# Patient Record
Sex: Male | Born: 1935 | Race: White | Hispanic: No | Marital: Married | Smoking: Former smoker
Health system: Southern US, Community
[De-identification: ages and names within clinical notes are randomized; demographics above are authoritative.]

## PROBLEM LIST (undated history)

## (undated) DIAGNOSIS — H409 Unspecified glaucoma: Secondary | ICD-10-CM

## (undated) DIAGNOSIS — C4441 Basal cell carcinoma of skin of scalp and neck: Secondary | ICD-10-CM

## (undated) DIAGNOSIS — C44211 Basal cell carcinoma of skin of unspecified ear and external auricular canal: Secondary | ICD-10-CM

## (undated) DIAGNOSIS — M1990 Unspecified osteoarthritis, unspecified site: Secondary | ICD-10-CM

## (undated) HISTORY — PX: BASAL CELL CARCINOMA EXCISION: SHX1214

## (undated) HISTORY — PX: EYE SURGERY: SHX253

## (undated) HISTORY — PX: PARATHYROIDECTOMY: SHX19

## (undated) HISTORY — PX: TONSILLECTOMY: SUR1361

## (undated) HISTORY — PX: HAND SURGERY: SHX662

## (undated) HISTORY — PX: BACK SURGERY: SHX140

## (undated) NOTE — *Deleted (*Deleted)
Physical Medicine and Rehabilitation Consult  Reason for Consult: Stroke with functional deficits Referring Physician: Dr. Lequita Halt   HPI: Justin Rose is a 33 y.o. male with history of glaucoma, BCC, OA bilateral Knees who was admitted on 09/30/20 for L-TKR by Dr. Lequita Halt. Posts op he reported dizziness with left elbow numbness as well as visual deficits with floaters as well as transient speech difficutly. . CT head negative for acute changes. 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  CTA head/neck was negative for LVO or significant stenosis. BLE dopplers were negative for DVT. 2 D echo showed EF 60-65%. Dr. Roda Shutters felt that stroke was embolic due to unknown source and recommended DAPT --patient refused ILR therefore 30 day cardiac monitor recommended after discharge.  Foley placed due to urinary retention and Urine culture positive for proteus mirabilis. Therapy evaluations revealed mild cognitive impairment as well as LLE limitation due to recent TKR. CIR recommended due to functional deficits.    ROS    Past Medical History:  Diagnosis Date  . Arthritis   . Basal cell carcinoma (BCC) of head   . Basal cell carcinoma, ear    Left ear  . Glaucoma    Left eye    Past Surgical History:  Procedure Laterality Date  . BACK SURGERY     2021  . BASAL CELL CARCINOMA EXCISION    . EYE SURGERY     08/2020 left eye  . HAND SURGERY    . PARATHYROIDECTOMY    . TONSILLECTOMY    . TOTAL KNEE ARTHROPLASTY Right 09/30/2020   Procedure: TOTAL KNEE ARTHROPLASTY;  Surgeon: Ollen Gross, MD;  Location: [facility];  Service: Orthopedics;  Laterality: Right;    History reviewed. No pertinent family history.    Social History:  reports that he has quit smoking. He has a 4.00 pack-year smoking history. He has never used smokeless tobacco. He  reports that he does not drink alcohol and does not use drugs.    Allergies: No Known Allergies    Medications Prior to Admission  Medication Sig Dispense Refill  . brimonidine-timolol (COMBIGAN) 0.2-0.5 % ophthalmic solution Place 1 drop into the left eye daily.    . cetirizine (ZYRTEC) 10 MG tablet Take 10 mg by mouth daily.    . cholecalciferol (VITAMIN D3) 25 MCG (1000 UNIT) tablet Take 1,000 Units by mouth daily.    Marland Kitchen GLUCOSAMINE-CHONDROITIN PO Take 1 tablet by mouth daily.    Marland Kitchen ipratropium (ATROVENT) 0.06 % nasal spray Place 2 sprays into both nostrils daily.    Marland Kitchen MAGNESIUM PO Take 1 tablet by mouth daily.    . meloxicam (MOBIC) 15 MG tablet Take 15 mg by mouth daily.    . vitamin B-12 (CYANOCOBALAMIN) 1000 MCG tablet Take 1,000 mcg by mouth daily.      Home: Home Living Family/patient expects to be discharged to:: Private residence Living Arrangements: Spouse/significant other Available Help at Discharge: Family Type of Home: House Home Access: Stairs to enter Secretary/administrator of Steps: 3 Entrance Stairs-Rails: Left Home Layout: One level Bathroom Shower/Tub: Health visitor: Handicapped height Bathroom Accessibility: Yes Home Equipment: Environmental consultant - 2 wheels, Crutches, Cane - single point, Wheelchair - manual Additional Comments: wife named Development worker, community  Functional History: Prior Function Level of Independence: Independent Comments: reports enjoys playing golf and does all the yard work Psychologist, clinical  Status:  Mobility: Bed Mobility Overal bed mobility: Needs Assistance Bed Mobility: Supine to Sit Supine to sit: Min assist Sit to supine: Max assist General bed mobility comments: minA to scoot forward, requires hand hold to pull forward Transfers Overall transfer level: Needs assistance Equipment used: Rolling walker (2 wheeled) Transfers: Sit to/from Stand, [employer] Transfers Sit to Stand: Mod assist, Max assist Stand pivot transfers: Mod assist  General transfer comment: modA with initial transfer, maxA from bedside commode, pt continues to have difficulty extending L knee and bilateral hips, often with posterior lean requiring physical assistance to correct Ambulation/Gait Ambulation/Gait assistance: Min assist Gait Distance (Feet): 5 Feet Assistive device: Rolling walker (2 wheeled) Gait Pattern/deviations: Step-to pattern General Gait Details: pt with short step-to gait, reduced foot clearance bilaterally Gait velocity: reduced Gait velocity interpretation: <1.31 ft/sec, indicative of household ambulator    ADL: ADL Overall ADL's : Needs assistance/impaired Eating/Feeding: Modified independent Grooming: Wash/dry hands, Wash/dry face, Oral care, Minimal assistance, Cueing for safety, Cueing for sequencing Lower Body Dressing: Total assistance Toilet Transfer: +2 for physical assistance, Maximal assistance Functional mobility during ADLs: +2 for physical assistance, Maximal assistance, Cueing for safety, Cueing for sequencing General ADL Comments: pt requires cues throughout session for safety and hand placement  Cognition: Cognition Overall Cognitive Status: Impaired/Different from baseline Arousal/Alertness: Awake/alert Orientation Level: Oriented X4 Attention: Selective Selective Attention: Impaired Selective Attention Impairment: Verbal basic Memory: Impaired Memory Impairment: Storage deficit Problem Solving: Impaired Problem Solving Impairment: Verbal complex Cognition Arousal/Alertness: Awake/alert Behavior During Therapy: WFL for tasks assessed/performed Overall Cognitive Status: Impaired/Different from baseline Area of Impairment: Problem solving Memory: Decreased short-term memory Following Commands: Follows one step commands consistently Safety/Judgement: Decreased awareness of safety, Decreased awareness of deficits Awareness: Emergent Problem Solving: Difficulty sequencing General Comments: WFL  cognition for tasks performed this date, not formally assessed  Blood pressure (!) 145/96, pulse 92, temperature 98 F (36.7 C), temperature source Oral, resp. rate 17, height 5\' 11"  (1.803 m), weight 85.5 kg, SpO2 100 %. Physical Exam  Results for orders placed or performed during the hospital encounter of 09/30/20 (from the past 24 hour(s))  CK     Status: Abnormal   Collection Time: 10/07/20  2:30 AM  Result Value Ref Range   Total CK 927 (H) 49.0 - 397.0 U/L  Lactate dehydrogenase     Status: Abnormal   Collection Time: 10/07/20  2:30 AM  Result Value Ref Range   LDH 234 (H) 98 - 192 U/L   No results found.  ***  Jacquelynn Cree, PA-C 10/07/2020

---

## 2020-09-17 NOTE — Patient Instructions (Addendum)
DUE TO COVID-19 ONLY ONE VISITOR IS ALLOWED TO COME WITH YOU AND STAY IN THE WAITING ROOM ONLY DURING PRE OP AND PROCEDURE.   IF YOU WILL BE ADMITTED INTO THE HOSPITAL YOU ARE ALLOWED ONE SUPPORT PERSON DURING VISITATION HOURS ONLY (10AM -8PM)   . The support person may change daily. . The support person must pass our screening, gel in and out, and wear a mask at all times, including in the patient's room. . Patients must also wear a mask when staff or their support person are in the room.   COVID SWAB TESTING MUST BE COMPLETED ON:   Thursday, 09-26-20 @ 8:30 AM   [address] [place], [place] [address]  (Must self quarantine after testing. Follow instructions on handout.)    Your procedure is scheduled on:  Monday, 09-30-20   Report to [facility] Main  Entrance   Report to Short Stay at 5:30 AM   [facility])   Call this number if you have problems the morning of surgery [phone]   Do not eat food :After Midnight.   May have liquids until 4:15 AM  day of surgery  CLEAR LIQUID DIET  Foods Allowed                                                                     Foods Excluded  Water, Black Coffee and tea, regular and decaf            liquids that you cannot  Plain Jell-O in any flavor  (No red)                                  see through such as: Fruit ices (not with fruit pulp)                                      milk, soups, orange juice              Iced Popsicles (No red)                                      All solid food                                   Apple juices Sports drinks like Gatorade (No red) Lightly seasoned clear broth or consume(fat free) Sugar, honey syrup     Complete one Ensure drink the morning of surgery at 4:15 AM  the day of surgery.  Oral Hygiene is also important to reduce your risk of infection.                                    Remember - BRUSH YOUR TEETH THE MORNING OF SURGERY WITH YOUR REGULAR TOOTHPASTE   Do NOT smoke  after Midnight   Take these medicines the morning of surgery with  A SIP OF WATER:  Cetirizine                                You may not have any metal on your body including jewelry, and body piercings             Do not wear lotions, powders, perfumes/cologne, or deodorant             Men may shave face and neck.   Do not bring valuables to the hospital. [place].   Contacts, dentures or bridgework may not be worn into surgery.   Bring small overnight bag day of surgery.                 Please read over the following fact sheets you were given: IF YOU HAVE QUESTIONS ABOUT YOUR PRE OP INSTRUCTIONS PLEASE CALL [phone]   [place] - Preparing for Surgery Before surgery, you can play an important role.  Because skin is not sterile, your skin needs to be as free of germs as possible.  You can reduce the number of germs on your skin by washing with CHG (chlorahexidine gluconate) soap before surgery.  CHG is an antiseptic cleaner which kills germs and bonds with the skin to continue killing germs even after washing. Please DO NOT use if you have an allergy to CHG or antibacterial soaps.  If your skin becomes reddened/irritated stop using the CHG and inform your nurse when you arrive at Short Stay. Do not shave (including legs and underarms) for at least 48 hours prior to the first CHG shower.  You may shave your face/neck.  Please follow these instructions carefully:  1.  Shower with CHG Soap the night before surgery and the  morning of surgery.  2.  If you choose to wash your hair, wash your hair first as usual with your normal  shampoo.  3.  After you shampoo, rinse your hair and body thoroughly to remove the shampoo.                             4.  Use CHG as you would any other liquid soap.  You can apply chg directly to the skin and wash.  Gently with a scrungie or clean washcloth.  5.  Apply the CHG Soap to your body ONLY FROM THE NECK DOWN.    Do   not use on face/ open                           Wound or open sores. Avoid contact with eyes, ears mouth and   genitals (private parts).                       Wash face,  Genitals (private parts) with your normal soap.             6.  Wash thoroughly, paying special attention to the area where your    surgery  will be performed.  7.  Thoroughly rinse your body with warm water from the neck down.  8.  DO NOT shower/wash with your normal soap after using and rinsing off the CHG Soap.                9.  Pat yourself dry  with a clean towel.            10.  Wear clean pajamas.            11.  Place clean sheets on your bed the night of your first shower and do not  sleep with pets. Day of Surgery : Do not apply any lotions/deodorants the morning of surgery.  Please wear clean clothes to the hospital/surgery center.  FAILURE TO FOLLOW THESE INSTRUCTIONS MAY RESULT IN THE CANCELLATION OF YOUR SURGERY  PATIENT SIGNATURE_________________________________  NURSE SIGNATURE__________________________________  ________________________________________________________________________   Justin Rose  An incentive spirometer is a tool that can help keep your lungs clear and active. This tool measures how well you are filling your lungs with each breath. Taking long deep breaths may help reverse or decrease the chance of developing breathing (pulmonary) problems (especially infection) following:  A long period of time when you are unable to move or be active. BEFORE THE PROCEDURE   If the spirometer includes an indicator to show your best effort, your nurse or respiratory therapist will set it to a desired goal.  If possible, sit up straight or lean slightly forward. Try not to slouch.  Hold the incentive spirometer in an upright position. INSTRUCTIONS FOR USE  1. Sit on the edge of your bed if possible, or sit up as far as you can in bed or on a chair. 2. Hold the incentive spirometer  in an upright position. 3. Breathe out normally. 4. Place the mouthpiece in your mouth and seal your lips tightly around it. 5. Breathe in slowly and as deeply as possible, raising the piston or the ball toward the top of the column. 6. Hold your breath for 3-5 seconds or for as long as possible. Allow the piston or ball to fall to the bottom of the column. 7. Remove the mouthpiece from your mouth and breathe out normally. 8. Rest for a few seconds and repeat Steps 1 through 7 at least 10 times every 1-2 hours when you are awake. Take your time and take a few normal breaths between deep breaths. 9. The spirometer may include an indicator to show your best effort. Use the indicator as a goal to work toward during each repetition. 10. After each set of 10 deep breaths, practice coughing to be sure your lungs are clear. If you have an incision (the cut made at the time of surgery), support your incision when coughing by placing a pillow or rolled up towels firmly against it. Once you are able to get out of bed, walk around indoors and cough well. You may stop using the incentive spirometer when instructed by your caregiver.  RISKS AND COMPLICATIONS  Take your time so you do not get dizzy or light-headed.  If you are in pain, you may need to take or ask for pain medication before doing incentive spirometry. It is harder to take a deep breath if you are having pain. AFTER USE  Rest and breathe slowly and easily.  It can be helpful to keep track of a log of your progress. Your caregiver can provide you with a simple table to help with this. If you are using the spirometer at home, follow these instructions: [place] IF:   You are having difficultly using the spirometer.  You have trouble using the spirometer as often as instructed.  Your pain medication is not giving enough relief while using the spirometer.  You develop fever of 100.5 F (  38.1 C) or higher. SEEK IMMEDIATE MEDICAL  CARE IF:   You cough up bloody sputum that had not been present before.  You develop fever of 102 F (38.9 C) or greater.  You develop worsening pain at or near the incision site. MAKE SURE YOU:   Understand these instructions.  Will watch your condition.  Will get help right away if you are not doing well or get worse. Document Released: 03/15/2007 Document Revised: 01/25/2012 Document Reviewed: 05/16/2007 Exit[employer] Patient Information 2014 [employer], [place].   ________________________________________________________________________  WHAT IS A BLOOD TRANSFUSION? Blood Transfusion Information  A transfusion is the replacement of blood or some of its parts. Blood is made up of multiple cells which provide different functions.  Red blood cells carry oxygen and are used for blood loss replacement.  White blood cells fight against infection.  Platelets control bleeding.  Plasma helps clot blood.  Other blood products are available for specialized needs, such as hemophilia or other clotting disorders. BEFORE THE TRANSFUSION  Who gives blood for transfusions?   Healthy volunteers who are fully evaluated to make sure their blood is safe. This is blood bank blood. Transfusion therapy is the safest it has ever been in the practice of medicine. Before blood is taken from a donor, a complete history is taken to make sure that person has no history of diseases nor engages in risky social behavior (examples are intravenous drug use or sexual activity with multiple partners). The donor's travel history is screened to minimize risk of transmitting infections, such as malaria. The donated blood is tested for signs of infectious diseases, such as HIV and hepatitis. The blood is then tested to be sure it is compatible with you in order to minimize the chance of a transfusion reaction. If you or a relative donates blood, this is often done in anticipation of surgery and is not appropriate for  emergency situations. It takes many days to process the donated blood. RISKS AND COMPLICATIONS Although transfusion therapy is very safe and saves many lives, the main dangers of transfusion include:   Getting an infectious disease.  Developing a transfusion reaction. This is an allergic reaction to something in the blood you were given. Every precaution is taken to prevent this. The decision to have a blood transfusion has been considered carefully by your caregiver before blood is given. Blood is not given unless the benefits outweigh the risks. AFTER THE TRANSFUSION  Right after receiving a blood transfusion, you will usually feel much better and more energetic. This is especially true if your red blood cells have gotten low (anemic). The transfusion raises the level of the red blood cells which carry oxygen, and this usually causes an energy increase.  The nurse administering the transfusion will monitor you carefully for complications. HOME CARE INSTRUCTIONS  No special instructions are needed after a transfusion. You may find your energy is better. Speak with your caregiver about any limitations on activity for underlying diseases you may have. SEEK MEDICAL CARE IF:   Your condition is not improving after your transfusion.  You develop redness or irritation at the intravenous (IV) site. SEEK IMMEDIATE MEDICAL CARE IF:  Any of the following symptoms occur over the next 12 hours:  Shaking chills.  You have a temperature by mouth above 102 F (38.9 C), not controlled by medicine.  Chest, back, or muscle pain.  People around you feel you are not acting correctly or are confused.  Shortness of breath or difficulty breathing.  Dizziness and fainting.  You get a rash or develop hives.  You have a decrease in urine output.  Your urine turns a dark color or changes to pink, red, or brown. Any of the following symptoms occur over the next 10 days:  You have a temperature by  mouth above 102 F (38.9 C), not controlled by medicine.  Shortness of breath.  Weakness after normal activity.  The white part of the eye turns yellow (jaundice).  You have a decrease in the amount of urine or are urinating less often.  Your urine turns a dark color or changes to pink, red, or brown. Document Released: 10/30/2000 Document Revised: 01/25/2012 Document Reviewed: 06/18/2008 [facility] Patient Information 2014 [place], [place].  _______________________________________________________________________

## 2020-09-17 NOTE — Progress Notes (Addendum)
COVID Vaccine Completed:  x2 Date COVID Vaccine completed:  11-16-19 &  12-14-19 09-19-20 Booster COVID vaccine manufacturer: [employer]   PCP - Charlcie Cradle, MD Cardiologist -   Chest x-ray -  EKG -  Stress Test -  ECHO -  Cardiac Cath -  Pacemaker/ICD device last checked:  Sleep Study -  CPAP -   Fasting Blood Sugar -  Checks Blood Sugar _____ times a day  Blood Thinner Instructions: Aspirin Instructions: Last Dose:  Anesthesia review:   Patient denies shortness of breath, fever, cough and chest pain at PAT appointment   Patient verbalized understanding of instructions that were given to them at the PAT appointment. Patient was also instructed that they will need to review over the PAT instructions again at home before surgery.

## 2020-09-20 ENCOUNTER — Encounter (HOSPITAL_COMMUNITY): Payer: Self-pay

## 2020-09-20 ENCOUNTER — Other Ambulatory Visit: Payer: Self-pay

## 2020-09-20 ENCOUNTER — Encounter (HOSPITAL_COMMUNITY)
Admission: RE | Admit: 2020-09-20 | Discharge: 2020-09-20 | Disposition: A | Discharge status: Home / Self Care | Payer: Medicare HMO | Source: Ambulatory Visit | Attending: Orthopedic Surgery | Admitting: Orthopedic Surgery

## 2020-09-20 DIAGNOSIS — Z01818 Encounter for other preprocedural examination: Secondary | ICD-10-CM | POA: Diagnosis not present

## 2020-09-20 HISTORY — DX: Unspecified osteoarthritis, unspecified site: M19.90

## 2020-09-20 HISTORY — DX: Basal cell carcinoma of skin of unspecified ear and external auricular canal: C44.211

## 2020-09-20 HISTORY — DX: Unspecified glaucoma: H40.9

## 2020-09-20 HISTORY — DX: Basal cell carcinoma of skin of scalp and neck: C44.41

## 2020-09-20 LAB — COMPREHENSIVE METABOLIC PANEL
ALT: 18 U/L (ref 0–44)
AST: 18 U/L (ref 15–41)
Albumin: 4 g/dL (ref 3.5–5.0)
Alkaline Phosphatase: 72 U/L (ref 38–126)
Anion gap: 9 (ref 5–15)
BUN: 29 mg/dL — ABNORMAL HIGH (ref 8–23)
CO2: 25 mmol/L (ref 22–32)
Calcium: 9.3 mg/dL (ref 8.9–10.3)
Chloride: 110 mmol/L (ref 98–111)
Creatinine, Ser: 0.78 mg/dL (ref 0.61–1.24)
GFR, Estimated: >60 mL/min (ref >60)
Glucose, Bld: 90 mg/dL (ref 70–99)
Potassium: 4.8 mmol/L (ref 3.5–5.1)
Sodium: 144 mmol/L (ref 135–145)
Total Bilirubin: 0.9 mg/dL (ref 0.3–1.2)
Total Protein: 6.7 g/dL (ref 6.5–8.1)

## 2020-09-20 LAB — CBC
HCT: 43.1 % (ref 39.0–52.0)
Hemoglobin: 13.9 g/dL (ref 13.0–17.0)
MCH: 31.2 pg (ref 26.0–34.0)
MCHC: 32.3 g/dL (ref 30.0–36.0)
MCV: 96.9 fL (ref 80.0–100.0)
Platelets: 171 10*3/uL (ref 150–400)
RBC: 4.45 MIL/uL (ref 4.22–5.81)
RDW: 13 % (ref 11.5–15.5)
WBC: 5.7 10*3/uL (ref 4.0–10.5)
nRBC: 0 % (ref 0.0–0.2)

## 2020-09-20 LAB — PROTIME-INR
INR: 1.1 (ref 0.8–1.2)
Prothrombin Time: 13.4 seconds (ref 11.4–15.2)

## 2020-09-20 LAB — SURGICAL PCR SCREEN
MRSA, PCR: NEGATIVE
Staphylococcus aureus: POSITIVE — AB

## 2020-09-20 LAB — APTT: aPTT: 38 seconds — ABNORMAL HIGH (ref 24–36)

## 2020-09-20 LAB — TYPE AND SCREEN
ABO/RH(D): O POS
Antibody Screen: NEGATIVE

## 2020-09-25 NOTE — H&P (Signed)
TOTAL KNEE ADMISSION H&P  Patient is being admitted for right total knee arthroplasty.  Subjective:  Chief Complaint: Right knee pain.  HPI: Justin Rose, 86 y.o. male has a history of pain and functional disability in the right knee due to arthritis and has failed non-surgical conservative treatments for greater than 12 weeks to include corticosteriod injections and activity modification. Onset of symptoms was gradual, starting several years ago with gradually worsening course since that time. The patient noted no past surgery on the right knee.  Patient currently rates pain in the right knee at 6 out of 10 with activity. Patient has worsening of pain with activity and weight bearing, pain that interferes with activities of daily living and crepitus. Patient has evidence of bone-on-bone arthritis in the medial and patellofemoral compartments of the right knee with as large marginal osteophytes as well as anterior and posterior osteophytes by imaging studies. There is no active infection.  There are no problems to display for this patient.   Past Medical History:  Diagnosis Date  . Arthritis   . Basal cell carcinoma (BCC) of head   . Basal cell carcinoma, ear    Left ear  . Glaucoma    Left eye    Past Surgical History:  Procedure Laterality Date  . BACK SURGERY     2021  . BASAL CELL CARCINOMA EXCISION    . EYE SURGERY     08/2020 left eye  . HAND SURGERY    . PARATHYROIDECTOMY    . TONSILLECTOMY      Prior to Admission medications   Medication Sig Start Date End Date Taking? Authorizing Provider  brimonidine-timolol (COMBIGAN) 0.2-0.5 % ophthalmic solution Place 1 drop into the left eye daily.   Yes [provider]  cetirizine (ZYRTEC) 10 MG tablet Take 10 mg by mouth daily.   Yes [provider]  cholecalciferol (VITAMIN D3) 25 MCG (1000 UNIT) tablet Take 1,000 Units by mouth daily.   Yes [provider]  GLUCOSAMINE-CHONDROITIN PO Take 1 tablet by  mouth daily.   Yes [provider]  ipratropium (ATROVENT) 0.06 % nasal spray Place 2 sprays into both nostrils daily. 08/20/20  Yes [provider]  MAGNESIUM PO Take 1 tablet by mouth daily.   Yes [provider]  meloxicam (MOBIC) 15 MG tablet Take 15 mg by mouth daily. 07/04/20  Yes [provider]  vitamin B-12 (CYANOCOBALAMIN) 1000 MCG tablet Take 1,000 mcg by mouth daily.   Yes [provider]    No Known Allergies  Social History   Socioeconomic History  . Marital status: Married    Spouse name: Not on file  . Number of children: Not on file  . Years of education: Not on file  . Highest education level: Not on file  Occupational History  . Not on file  Tobacco Use  . Smoking status: Former Smoker    Packs/day: 0.25    Years: 16.00    Pack years: 4.00  . Smokeless tobacco: Never Used  . Tobacco comment: Quit 1970s  Vaping Use  . Vaping Use: Never used  Substance and Sexual Activity  . Alcohol use: Never  . Drug use: Never  . Sexual activity: Not on file  Other Topics Concern  . Not on file  Social History Narrative  . Not on file   Social Determinants of Health   Financial Resource Strain:   . Difficulty of Paying Living Expenses: Not on file  Food Insecurity:   .  Worried About Charity fundraiser in the Last Year: Not on file  . Ran Out of Food in the Last Year: Not on file  Transportation Needs:   . Lack of Transportation (Medical): Not on file  . Lack of Transportation (Non-Medical): Not on file  Physical Activity:   . Days of Exercise per Week: Not on file  . Minutes of Exercise per Session: Not on file  Stress:   . Feeling of Stress : Not on file  Social Connections:   . Frequency of Communication with Friends and Family: Not on file  . Frequency of Social Gatherings with Friends and Family: Not on file  . Attends Religious Services: Not on file  . Active Member of Clubs or Organizations: Not on file  .  Attends Archivist Meetings: Not on file  . Marital Status: Not on file  Intimate Partner Violence:   . Fear of Current or Ex-Partner: Not on file  . Emotionally Abused: Not on file  . Physically Abused: Not on file  . Sexually Abused: Not on file      Tobacco Use: Medium Risk  . Smoking Tobacco Use: Former Smoker  . Smokeless Tobacco Use: Never Used   Social History   Substance and Sexual Activity  Alcohol Use Never    No family history on file.  Review of Systems  Constitutional: Negative for chills and fever.  HENT: Negative for congestion, sore throat and tinnitus.   Eyes: Negative for double vision, photophobia and pain.  Respiratory: Negative for cough, shortness of breath and wheezing.   Cardiovascular: Negative for chest pain, palpitations and orthopnea.  Gastrointestinal: Negative for heartburn, nausea and vomiting.  Genitourinary: Negative for dysuria, frequency and urgency.  Musculoskeletal: Positive for joint pain.  Neurological: Negative for dizziness, weakness and headaches.    Objective:  Physical Exam: Well nourished and well developed.  General: Alert and oriented x3, cooperative and pleasant, no acute distress.  Head: normocephalic, atraumatic, neck supple.  Eyes: EOMI.  Respiratory: breath sounds clear in all fields, no wheezing, rales, or rhonchi. Cardiovascular: Regular rate and rhythm, no murmurs, gallops or rubs.  Abdomen: non-tender to palpation and soft, normoactive bowel sounds. Musculoskeletal:  Right Knee Exam:  No effusion present. No swelling present.  The range of motion is: 5 to 125 degrees.  Moderate crepitus on range of motion of the knee.  Very slight medial joint line tenderness.  No lateral joint line tenderness.  The knee is stable.   Calves soft and nontender. Motor function intact in LE. Strength 5/5 LE bilaterally. Neuro: Distal pulses 2+. Sensation to light touch intact in LE.  Imaging Review Plain  radiographs demonstrate severe degenerative joint disease of the right knee. The overall alignment is neutral. The bone quality appears to be adequate for age and reported activity level.  Assessment/Plan:  End stage arthritis, right knee   The patient history, physical examination, clinical judgment of the provider and imaging studies are consistent with end stage degenerative joint disease of the right knee and total knee arthroplasty is deemed medically necessary. The treatment options including medical management, injection therapy arthroscopy and arthroplasty were discussed at length. The risks and benefits of total knee arthroplasty were presented and reviewed. The risks due to aseptic loosening, infection, stiffness, patella tracking problems, thromboembolic complications and other imponderables were discussed. The patient acknowledged the explanation, agreed to proceed with the plan and consent was signed. Patient is being admitted for inpatient treatment for surgery, pain control,  PT, OT, prophylactic antibiotics, VTE prophylaxis, progressive ambulation and ADLs and discharge planning. The patient is planning to be discharged home.  Therapy Plans: Outpatient therapy at [facility]) Disposition: Home with wife Planned DVT Prophylaxis: Aspirin 325 mg BID DME Needed: None PCP: Charlcie Cradle, MD  TXA: IV Allergies: NKDA Anesthesia Concerns: None BMI: 25.7 Last HgbA1c: Not diabetic  Pharmacy: [facility] ([place])  Other:  - Tolerates oxycodone - Will call Dr. Vista Lawman' office today regarding clearance  - Patient was instructed on what medications to stop prior to surgery. - Follow-up visit in 2 weeks with Dr. Wynelle Link - Begin physical therapy following surgery - Pre-operative lab work as pre-surgical testing - Prescriptions will be provided in hospital at time of discharge  Theresa Duty, PA-C Orthopedic Surgery [facility] [place]

## 2020-09-26 ENCOUNTER — Other Ambulatory Visit (HOSPITAL_COMMUNITY)
Admission: RE | Admit: 2020-09-26 | Discharge: 2020-09-26 | Disposition: A | Discharge status: Home / Self Care | Payer: Medicare HMO | Source: Ambulatory Visit | Attending: Orthopedic Surgery | Admitting: Orthopedic Surgery

## 2020-09-26 DIAGNOSIS — Z01812 Encounter for preprocedural laboratory examination: Secondary | ICD-10-CM | POA: Diagnosis present

## 2020-09-26 DIAGNOSIS — Z20822 Contact with and (suspected) exposure to covid-19: Secondary | ICD-10-CM | POA: Insufficient documentation

## 2020-09-26 LAB — SARS CORONAVIRUS 2 (TAT 6-24 HRS): SARS Coronavirus 2: NEGATIVE

## 2020-09-29 MED ORDER — BUPIVACAINE LIPOSOME 1.3 % IJ SUSP
20.0000 mL | Freq: Once | INTRAMUSCULAR | Status: DC
  Filled 2020-09-29: qty 20

## 2020-09-30 ENCOUNTER — Other Ambulatory Visit: Payer: Self-pay

## 2020-09-30 ENCOUNTER — Ambulatory Visit (HOSPITAL_COMMUNITY): Payer: Medicare HMO | Admitting: Anesthesiology

## 2020-09-30 ENCOUNTER — Encounter (HOSPITAL_COMMUNITY): Payer: Self-pay | Admitting: Orthopedic Surgery

## 2020-09-30 ENCOUNTER — Encounter (HOSPITAL_COMMUNITY)
Admission: RE | Disposition: A | Discharge status: Rehabilitation Facility | Payer: Self-pay | Source: Ambulatory Visit | Attending: Orthopedic Surgery

## 2020-09-30 ENCOUNTER — Inpatient Hospital Stay (HOSPITAL_COMMUNITY)
Admission: RE | Admit: 2020-09-30 | Discharge: 2020-10-09 | DRG: 469 | Disposition: A | Discharge status: Rehabilitation Facility | Payer: Medicare HMO | Source: Ambulatory Visit | Attending: Orthopedic Surgery | Admitting: Orthopedic Surgery

## 2020-09-30 DIAGNOSIS — M179 Osteoarthritis of knee, unspecified: Secondary | ICD-10-CM | POA: Diagnosis present

## 2020-09-30 DIAGNOSIS — Z6826 Body mass index (BMI) 26.0-26.9, adult: Secondary | ICD-10-CM

## 2020-09-30 DIAGNOSIS — R29700 NIHSS score 0: Secondary | ICD-10-CM | POA: Diagnosis not present

## 2020-09-30 DIAGNOSIS — H409 Unspecified glaucoma: Secondary | ICD-10-CM | POA: Diagnosis present

## 2020-09-30 DIAGNOSIS — B964 Proteus (mirabilis) (morganii) as the cause of diseases classified elsewhere: Secondary | ICD-10-CM | POA: Diagnosis not present

## 2020-09-30 DIAGNOSIS — R42 Dizziness and giddiness: Secondary | ICD-10-CM

## 2020-09-30 DIAGNOSIS — D62 Acute posthemorrhagic anemia: Secondary | ICD-10-CM

## 2020-09-30 DIAGNOSIS — N400 Enlarged prostate without lower urinary tract symptoms: Secondary | ICD-10-CM | POA: Diagnosis present

## 2020-09-30 DIAGNOSIS — E669 Obesity, unspecified: Secondary | ICD-10-CM | POA: Diagnosis present

## 2020-09-30 DIAGNOSIS — M170 Bilateral primary osteoarthritis of knee: Principal | ICD-10-CM | POA: Diagnosis present

## 2020-09-30 DIAGNOSIS — M1711 Unilateral primary osteoarthritis, right knee: Secondary | ICD-10-CM | POA: Diagnosis present

## 2020-09-30 DIAGNOSIS — M1710 Unilateral primary osteoarthritis, unspecified knee: Secondary | ICD-10-CM | POA: Diagnosis present

## 2020-09-30 DIAGNOSIS — M6282 Rhabdomyolysis: Secondary | ICD-10-CM | POA: Diagnosis not present

## 2020-09-30 DIAGNOSIS — Z87891 Personal history of nicotine dependence: Secondary | ICD-10-CM

## 2020-09-30 DIAGNOSIS — Z791 Long term (current) use of non-steroidal anti-inflammatories (NSAID): Secondary | ICD-10-CM

## 2020-09-30 DIAGNOSIS — H538 Other visual disturbances: Secondary | ICD-10-CM | POA: Diagnosis present

## 2020-09-30 DIAGNOSIS — I6389 Other cerebral infarction: Principal | ICD-10-CM | POA: Diagnosis present

## 2020-09-30 DIAGNOSIS — T466X5A Adverse effect of antihyperlipidemic and antiarteriosclerotic drugs, initial encounter: Secondary | ICD-10-CM | POA: Diagnosis not present

## 2020-09-30 DIAGNOSIS — Z79899 Other long term (current) drug therapy: Secondary | ICD-10-CM

## 2020-09-30 DIAGNOSIS — R279 Unspecified lack of coordination: Secondary | ICD-10-CM

## 2020-09-30 DIAGNOSIS — Z85828 Personal history of other malignant neoplasm of skin: Secondary | ICD-10-CM

## 2020-09-30 DIAGNOSIS — E785 Hyperlipidemia, unspecified: Secondary | ICD-10-CM | POA: Diagnosis present

## 2020-09-30 DIAGNOSIS — R339 Retention of urine, unspecified: Secondary | ICD-10-CM

## 2020-09-30 DIAGNOSIS — K5903 Drug induced constipation: Secondary | ICD-10-CM

## 2020-09-30 DIAGNOSIS — M609 Myositis, unspecified: Secondary | ICD-10-CM | POA: Diagnosis not present

## 2020-09-30 DIAGNOSIS — N390 Urinary tract infection, site not specified: Secondary | ICD-10-CM

## 2020-09-30 DIAGNOSIS — I6340 Cerebral infarction due to embolism of unspecified cerebral artery: Secondary | ICD-10-CM | POA: Diagnosis not present

## 2020-09-30 DIAGNOSIS — N401 Enlarged prostate with lower urinary tract symptoms: Secondary | ICD-10-CM | POA: Diagnosis present

## 2020-09-30 HISTORY — PX: TOTAL KNEE ARTHROPLASTY: SHX125

## 2020-09-30 SURGERY — ARTHROPLASTY, KNEE, TOTAL
Anesthesia: Spinal | Site: Knee | Laterality: Right

## 2020-09-30 MED ORDER — ACETAMINOPHEN 325 MG PO TABS: 325.0000 mg | ORAL_TABLET | Freq: Once | ORAL | Status: DC | PRN

## 2020-09-30 MED ORDER — LORATADINE 10 MG PO TABS
10.0000 mg | ORAL_TABLET | Freq: Every day | ORAL | Status: DC
  Administered 2020-10-01 – 2020-10-09 (×9): 10 mg via ORAL
  Filled 2020-09-30 (×9): qty 1

## 2020-09-30 MED ORDER — CEFAZOLIN SODIUM-DEXTROSE 2-4 GM/100ML-% IV SOLN
2.0000 g | INTRAVENOUS | Status: AC
  Administered 2020-09-30: 2 g via INTRAVENOUS
  Filled 2020-09-30: qty 100

## 2020-09-30 MED ORDER — DOCUSATE SODIUM 100 MG PO CAPS
100.0000 mg | ORAL_CAPSULE | Freq: Two times a day (BID) | ORAL | Status: DC
  Administered 2020-09-30 – 2020-10-04 (×8): 100 mg via ORAL
  Filled 2020-09-30 (×8): qty 1

## 2020-09-30 MED ORDER — CHLORHEXIDINE GLUCONATE 0.12 % MT SOLN
15.0000 mL | Freq: Once | OROMUCOSAL | Status: AC
  Administered 2020-09-30: 15 mL via OROMUCOSAL

## 2020-09-30 MED ORDER — PROPOFOL 1000 MG/100ML IV EMUL
INTRAVENOUS | Status: AC
  Filled 2020-09-30: qty 100

## 2020-09-30 MED ORDER — BRIMONIDINE TARTRATE 0.2 % OP SOLN
1.0000 [drp] | Freq: Every day | OPHTHALMIC | Status: DC
  Administered 2020-09-30 – 2020-10-06 (×7): 1 [drp] via OPHTHALMIC
  Filled 2020-09-30: qty 5

## 2020-09-30 MED ORDER — DEXAMETHASONE SODIUM PHOSPHATE 10 MG/ML IJ SOLN
8.0000 mg | Freq: Once | INTRAMUSCULAR | Status: AC
  Administered 2020-09-30: 10 mg via INTRAVENOUS

## 2020-09-30 MED ORDER — DEXAMETHASONE SODIUM PHOSPHATE 10 MG/ML IJ SOLN
INTRAMUSCULAR | Status: AC
  Filled 2020-09-30: qty 1

## 2020-09-30 MED ORDER — ACETAMINOPHEN 10 MG/ML IV SOLN: 1000.0000 mg | Freq: Once | INTRAVENOUS | Status: DC | PRN

## 2020-09-30 MED ORDER — PROPOFOL 10 MG/ML IV BOLUS
INTRAVENOUS | Status: DC | PRN
  Administered 2020-09-30: 30 mg via INTRAVENOUS

## 2020-09-30 MED ORDER — METHOCARBAMOL 500 MG PO TABS
500.0000 mg | ORAL_TABLET | Freq: Four times a day (QID) | ORAL | Status: DC | PRN
  Administered 2020-09-30 – 2020-10-09 (×17): 500 mg via ORAL
  Filled 2020-09-30 (×18): qty 1

## 2020-09-30 MED ORDER — FLEET ENEMA 7-19 GM/118ML RE ENEM: 1.0000 | ENEMA | Freq: Once | RECTAL | Status: DC | PRN

## 2020-09-30 MED ORDER — ONDANSETRON HCL 4 MG/2ML IJ SOLN
INTRAMUSCULAR | Status: DC | PRN
  Administered 2020-09-30: 4 mg via INTRAVENOUS

## 2020-09-30 MED ORDER — ACETAMINOPHEN 325 MG PO TABS: 325.0000 mg | ORAL_TABLET | Freq: Four times a day (QID) | ORAL | Status: DC | PRN

## 2020-09-30 MED ORDER — MENTHOL 3 MG MT LOZG: 1.0000 | LOZENGE | OROMUCOSAL | Status: DC | PRN

## 2020-09-30 MED ORDER — ONDANSETRON HCL 4 MG/2ML IJ SOLN
4.0000 mg | Freq: Four times a day (QID) | INTRAMUSCULAR | Status: DC | PRN
  Administered 2020-10-01: 4 mg via INTRAVENOUS
  Filled 2020-09-30: qty 2

## 2020-09-30 MED ORDER — LACTATED RINGERS IV SOLN: INTRAVENOUS | Status: DC

## 2020-09-30 MED ORDER — BISACODYL 10 MG RE SUPP: 10.0000 mg | Freq: Every day | RECTAL | Status: DC | PRN

## 2020-09-30 MED ORDER — ACETAMINOPHEN 160 MG/5ML PO SOLN: 325.0000 mg | Freq: Once | ORAL | Status: DC | PRN

## 2020-09-30 MED ORDER — METOCLOPRAMIDE HCL 5 MG/ML IJ SOLN: 5.0000 mg | Freq: Three times a day (TID) | INTRAMUSCULAR | Status: DC | PRN

## 2020-09-30 MED ORDER — BUPIVACAINE LIPOSOME 1.3 % IJ SUSP
INTRAMUSCULAR | Status: DC | PRN
  Administered 2020-09-30: 20 mL

## 2020-09-30 MED ORDER — ACETAMINOPHEN 10 MG/ML IV SOLN
1000.0000 mg | Freq: Four times a day (QID) | INTRAVENOUS | Status: DC
  Administered 2020-09-30: 1000 mg via INTRAVENOUS
  Filled 2020-09-30: qty 100

## 2020-09-30 MED ORDER — AMISULPRIDE (ANTIEMETIC) 5 MG/2ML IV SOLN: 10.0000 mg | Freq: Once | INTRAVENOUS | Status: DC | PRN

## 2020-09-30 MED ORDER — ONDANSETRON HCL 4 MG/2ML IJ SOLN
INTRAMUSCULAR | Status: AC
  Filled 2020-09-30: qty 2

## 2020-09-30 MED ORDER — STERILE WATER FOR IRRIGATION IR SOLN
Status: DC | PRN
  Administered 2020-09-30: 2000 mL

## 2020-09-30 MED ORDER — OXYCODONE HCL 5 MG PO TABS
5.0000 mg | ORAL_TABLET | ORAL | Status: DC | PRN
  Administered 2020-09-30 (×2): 5 mg via ORAL
  Administered 2020-10-01 (×2): 10 mg via ORAL
  Administered 2020-10-02 – 2020-10-03 (×4): 5 mg via ORAL
  Administered 2020-10-04 – 2020-10-06 (×5): 10 mg via ORAL
  Administered 2020-10-06 – 2020-10-08 (×5): 5 mg via ORAL
  Administered 2020-10-08 – 2020-10-09 (×2): 10 mg via ORAL
  Filled 2020-09-30 (×2): qty 2
  Filled 2020-09-30: qty 1
  Filled 2020-09-30: qty 2
  Filled 2020-09-30 (×2): qty 1
  Filled 2020-09-30: qty 2
  Filled 2020-09-30 (×3): qty 1
  Filled 2020-09-30: qty 2
  Filled 2020-09-30 (×2): qty 1
  Filled 2020-09-30 (×3): qty 2
  Filled 2020-09-30: qty 1
  Filled 2020-09-30 (×3): qty 2
  Filled 2020-09-30 (×2): qty 1

## 2020-09-30 MED ORDER — SODIUM CHLORIDE (PF) 0.9 % IJ SOLN
INTRAMUSCULAR | Status: AC
  Filled 2020-09-30: qty 50

## 2020-09-30 MED ORDER — SODIUM CHLORIDE 0.9 % IV SOLN: INTRAVENOUS | Status: DC

## 2020-09-30 MED ORDER — ORAL CARE MOUTH RINSE: 15.0000 mL | Freq: Once | OROMUCOSAL | Status: AC

## 2020-09-30 MED ORDER — FENTANYL CITRATE (PF) 100 MCG/2ML IJ SOLN
INTRAMUSCULAR | Status: DC | PRN
  Administered 2020-09-30: 25 ug via INTRAVENOUS
  Administered 2020-09-30: 50 ug via INTRAVENOUS
  Administered 2020-09-30: 25 ug via INTRAVENOUS

## 2020-09-30 MED ORDER — TRANEXAMIC ACID-NACL 1000-0.7 MG/100ML-% IV SOLN
1000.0000 mg | INTRAVENOUS | Status: AC
  Administered 2020-09-30: 1000 mg via INTRAVENOUS
  Filled 2020-09-30: qty 100

## 2020-09-30 MED ORDER — BRIMONIDINE TARTRATE-TIMOLOL 0.2-0.5 % OP SOLN: 1.0000 [drp] | Freq: Every day | OPHTHALMIC | Status: DC

## 2020-09-30 MED ORDER — IPRATROPIUM BROMIDE 0.06 % NA SOLN
2.0000 | Freq: Every day | NASAL | Status: DC
  Administered 2020-10-02 – 2020-10-09 (×8): 2 via NASAL
  Filled 2020-09-30: qty 15

## 2020-09-30 MED ORDER — ONDANSETRON HCL 4 MG PO TABS: 4.0000 mg | ORAL_TABLET | Freq: Four times a day (QID) | ORAL | Status: DC | PRN

## 2020-09-30 MED ORDER — METHOCARBAMOL 500 MG IVPB - SIMPLE MED
500.0000 mg | Freq: Four times a day (QID) | INTRAVENOUS | Status: DC | PRN
  Filled 2020-09-30: qty 50

## 2020-09-30 MED ORDER — FENTANYL CITRATE (PF) 100 MCG/2ML IJ SOLN
INTRAMUSCULAR | Status: AC
  Filled 2020-09-30: qty 2

## 2020-09-30 MED ORDER — TIMOLOL MALEATE 0.5 % OP SOLN
1.0000 [drp] | Freq: Every day | OPHTHALMIC | Status: DC
  Administered 2020-09-30 – 2020-10-05 (×3): 1 [drp] via OPHTHALMIC
  Filled 2020-09-30: qty 5

## 2020-09-30 MED ORDER — MEPERIDINE HCL 50 MG/ML IJ SOLN: 6.2500 mg | INTRAMUSCULAR | Status: DC | PRN

## 2020-09-30 MED ORDER — POLYETHYLENE GLYCOL 3350 17 G PO PACK
17.0000 g | PACK | Freq: Every day | ORAL | Status: DC | PRN
  Administered 2020-10-05 – 2020-10-06 (×2): 17 g via ORAL
  Filled 2020-09-30: qty 1

## 2020-09-30 MED ORDER — PHENOL 1.4 % MT LIQD: 1.0000 | OROMUCOSAL | Status: DC | PRN

## 2020-09-30 MED ORDER — METOCLOPRAMIDE HCL 5 MG PO TABS
5.0000 mg | ORAL_TABLET | Freq: Three times a day (TID) | ORAL | Status: DC | PRN
  Administered 2020-10-08: 10 mg via ORAL
  Filled 2020-09-30 (×2): qty 2

## 2020-09-30 MED ORDER — MORPHINE SULFATE (PF) 2 MG/ML IV SOLN
0.5000 mg | INTRAVENOUS | Status: DC | PRN
  Administered 2020-10-01 – 2020-10-03 (×5): 1 mg via INTRAVENOUS
  Filled 2020-09-30 (×6): qty 1

## 2020-09-30 MED ORDER — SODIUM CHLORIDE 0.9 % IR SOLN
Status: DC | PRN
  Administered 2020-09-30: 1000 mL

## 2020-09-30 MED ORDER — SODIUM CHLORIDE (PF) 0.9 % IJ SOLN
INTRAMUSCULAR | Status: AC
  Filled 2020-09-30: qty 10

## 2020-09-30 MED ORDER — HYDROMORPHONE HCL 1 MG/ML IJ SOLN: 0.2500 mg | INTRAMUSCULAR | Status: DC | PRN

## 2020-09-30 MED ORDER — PROPOFOL 500 MG/50ML IV EMUL
INTRAVENOUS | Status: DC | PRN
  Administered 2020-09-30: 60 ug/kg/min via INTRAVENOUS

## 2020-09-30 MED ORDER — ROPIVACAINE HCL 7.5 MG/ML IJ SOLN
INTRAMUSCULAR | Status: DC | PRN
  Administered 2020-09-30: 20 mL via PERINEURAL

## 2020-09-30 MED ORDER — TRAMADOL HCL 50 MG PO TABS
50.0000 mg | ORAL_TABLET | Freq: Four times a day (QID) | ORAL | Status: DC | PRN
  Administered 2020-09-30 – 2020-10-04 (×5): 100 mg via ORAL
  Administered 2020-10-08: 50 mg via ORAL
  Filled 2020-09-30 (×3): qty 2
  Filled 2020-09-30: qty 1
  Filled 2020-09-30 (×2): qty 2

## 2020-09-30 MED ORDER — CEFAZOLIN SODIUM-DEXTROSE 2-4 GM/100ML-% IV SOLN
2.0000 g | Freq: Four times a day (QID) | INTRAVENOUS | Status: AC
  Administered 2020-09-30 (×2): 2 g via INTRAVENOUS
  Filled 2020-09-30 (×2): qty 100

## 2020-09-30 MED ORDER — DEXAMETHASONE SODIUM PHOSPHATE 10 MG/ML IJ SOLN
10.0000 mg | Freq: Once | INTRAMUSCULAR | Status: AC
  Administered 2020-10-01: 10 mg via INTRAVENOUS
  Filled 2020-09-30: qty 1

## 2020-09-30 MED ORDER — DIPHENHYDRAMINE HCL 12.5 MG/5ML PO ELIX: 12.5000 mg | ORAL_SOLUTION | ORAL | Status: DC | PRN

## 2020-09-30 MED ORDER — SODIUM CHLORIDE (PF) 0.9 % IJ SOLN
INTRAMUSCULAR | Status: DC | PRN
  Administered 2020-09-30: 60 mL

## 2020-09-30 MED ORDER — BUPIVACAINE IN DEXTROSE 0.75-8.25 % IT SOLN
INTRATHECAL | Status: DC | PRN
  Administered 2020-09-30: 1.6 mL via INTRATHECAL

## 2020-09-30 MED ORDER — ASPIRIN EC 325 MG PO TBEC
325.0000 mg | DELAYED_RELEASE_TABLET | Freq: Two times a day (BID) | ORAL | Status: AC
  Administered 2020-10-01 – 2020-10-02 (×3): 325 mg via ORAL
  Filled 2020-09-30 (×3): qty 1

## 2020-09-30 MED ORDER — 0.9 % SODIUM CHLORIDE (POUR BTL) OPTIME
TOPICAL | Status: DC | PRN
  Administered 2020-09-30: 1000 mL

## 2020-09-30 MED ORDER — POVIDONE-IODINE 10 % EX SWAB
2.0000 "application " | Freq: Once | CUTANEOUS | Status: AC
  Administered 2020-09-30: 2 via TOPICAL

## 2020-09-30 SURGICAL SUPPLY — 52 items
BAG SPEC THK2 15X12 ZIP CLS (MISCELLANEOUS) ×1
BAG ZIPLOCK 12X15 (MISCELLANEOUS) ×3 IMPLANT
BLADE SAG 18X100X1.27 (BLADE) ×3 IMPLANT
BLADE SAW SGTL 11.0X1.19X90.0M (BLADE) ×3 IMPLANT
BLADE SURG SZ10 CARB STEEL (BLADE) ×6 IMPLANT
BNDG ELASTIC 6X5.8 VLCR STR LF (GAUZE/BANDAGES/DRESSINGS) ×3 IMPLANT
BOWL SMART MIX CTS (DISPOSABLE) ×3 IMPLANT
CEMENT HV SMART SET (Cement) ×6 IMPLANT
CEMENT TIBIA MBT SIZE 5 (Knees) ×1 IMPLANT
CLOSURE WOUND 1/2 X4 (GAUZE/BANDAGES/DRESSINGS) ×2
COVER SURGICAL LIGHT HANDLE (MISCELLANEOUS) ×3 IMPLANT
COVER WAND RF STERILE (DRAPES) IMPLANT
CUFF TOURN SGL QUICK 34 (TOURNIQUET CUFF) ×3
CUFF TRNQT CYL 34X4.125X (TOURNIQUET CUFF) ×1 IMPLANT
DECANTER SPIKE VIAL GLASS SM (MISCELLANEOUS) ×3 IMPLANT
DRAPE U-SHAPE 47X51 STRL (DRAPES) ×3 IMPLANT
DRSG AQUACEL AG ADV 3.5X10 (GAUZE/BANDAGES/DRESSINGS) ×3 IMPLANT
DURAPREP 26ML APPLICATOR (WOUND CARE) ×3 IMPLANT
ELECT REM PT RETURN 15FT ADLT (MISCELLANEOUS) ×3 IMPLANT
FEMUR SIGMA PS SZ 5.0 R (Femur) ×3 IMPLANT
GLOVE BIO SURGEON STRL SZ7 (GLOVE) ×3 IMPLANT
GLOVE BIO SURGEON STRL SZ8 (GLOVE) ×3 IMPLANT
GLOVE BIOGEL PI IND STRL 7.0 (GLOVE) ×1 IMPLANT
GLOVE BIOGEL PI IND STRL 8 (GLOVE) ×1 IMPLANT
GLOVE BIOGEL PI INDICATOR 7.0 (GLOVE) ×2
GLOVE BIOGEL PI INDICATOR 8 (GLOVE) ×2
GOWN STRL REUS W/TWL LRG LVL3 (GOWN DISPOSABLE) ×6 IMPLANT
HANDPIECE INTERPULSE COAX TIP (DISPOSABLE) ×3
HOLDER FOLEY CATH W/STRAP (MISCELLANEOUS) ×3 IMPLANT
IMMOBILIZER KNEE 20 (SOFTGOODS) ×3
IMMOBILIZER KNEE 20 THIGH 36 (SOFTGOODS) ×1 IMPLANT
KIT TURNOVER KIT A (KITS) IMPLANT
MANIFOLD NEPTUNE II (INSTRUMENTS) ×3 IMPLANT
NS IRRIG 1000ML POUR BTL (IV SOLUTION) ×3 IMPLANT
PACK TOTAL KNEE CUSTOM (KITS) ×3 IMPLANT
PADDING CAST COTTON 6X4 STRL (CAST SUPPLIES) ×3 IMPLANT
PATELLA DOME PFC 41MM (Knees) ×3 IMPLANT
PENCIL SMOKE EVACUATOR (MISCELLANEOUS) ×3 IMPLANT
PIN STEINMAN FIXATION KNEE (PIN) ×3 IMPLANT
PLATE ROT INSERT 10MM SIZE 5 (Plate) ×3 IMPLANT
PROTECTOR NERVE ULNAR (MISCELLANEOUS) ×3 IMPLANT
SET HNDPC FAN SPRY TIP SCT (DISPOSABLE) ×1 IMPLANT
STRIP CLOSURE SKIN 1/2X4 (GAUZE/BANDAGES/DRESSINGS) ×4 IMPLANT
SUT MNCRL AB 4-0 PS2 18 (SUTURE) ×3 IMPLANT
SUT STRATAFIX 0 PDS 27 VIOLET (SUTURE) ×3
SUT VIC AB 2-0 CT1 27 (SUTURE) ×9
SUT VIC AB 2-0 CT1 TAPERPNT 27 (SUTURE) ×3 IMPLANT
SUTURE STRATFX 0 PDS 27 VIOLET (SUTURE) ×1 IMPLANT
TIBIA MBT CEMENT SIZE 5 (Knees) ×3 IMPLANT
TRAY FOLEY MTR SLVR 16FR STAT (SET/KITS/TRAYS/PACK) ×3 IMPLANT
WATER STERILE IRR 1000ML POUR (IV SOLUTION) ×6 IMPLANT
WRAP KNEE MAXI GEL POST OP (GAUZE/BANDAGES/DRESSINGS) ×3 IMPLANT

## 2020-09-30 NOTE — Transfer of Care (Signed)
Immediate Anesthesia Transfer of Care Note  Patient: Justin Rose  Procedure(s) Performed: TOTAL KNEE ARTHROPLASTY (Right Knee)  Patient Location: PACU  Anesthesia Type:MAC and Spinal  Level of Consciousness: awake, alert , oriented and patient cooperative  Airway & Oxygen Therapy: Patient Spontanous Breathing and Patient connected to face mask oxygen  Post-op Assessment: Report given to RN, Post -op Vital signs reviewed and stable and Patient moving all extremities  Post vital signs: Reviewed and stable  Last Vitals:  Vitals Value Taken Time  BP    Temp    Pulse    Resp    SpO2      Last Pain:  Vitals:   09/30/20 0607  TempSrc: Oral  PainSc:       Patients Stated Pain Goal: 4 (05/09/75 2831)  Complications: No complications documented.

## 2020-09-30 NOTE — Anesthesia Procedure Notes (Signed)
Anesthesia Regional Block: Adductor canal block   Pre-Anesthetic Checklist: ,, timeout performed, Correct Patient, Correct Site, Correct Laterality, Correct Procedure, Correct Position, site marked, Risks and benefits discussed,  Surgical consent,  Pre-op evaluation,  At surgeon's request and post-op pain management  Laterality: Right  Prep: chloraprep       Needles:  Injection technique: Single-shot  Needle Type: Echogenic Stimulator Needle     Needle Length: 9cm  Needle Gauge: 21     Additional Needles:   Procedures:,,,, ultrasound used (permanent image in chart),,,,  Narrative:  Start time: 09/30/2020 6:45 AM End time: 09/30/2020 6:50 AM Injection made incrementally with aspirations every 5 mL.  Performed by: Personally  Anesthesiologist: Effie Berkshire, MD  Additional Notes: Patient tolerated the procedure well. Local anesthetic introduced in an incremental fashion under minimal resistance after negative aspirations. No paresthesias were elicited. After completion of the procedure, no acute issues were identified and patient continued to be monitored by RN.

## 2020-09-30 NOTE — Progress Notes (Signed)
Pt was out of bed with PT recently and is now complaining of numbness to his left elbow, "new floaters in his vision", and is light headed.  Kristie Edmisten notified.  She stated Dr Wynelle Link will be up to round on his patients in a hour or 2.

## 2020-09-30 NOTE — Anesthesia Postprocedure Evaluation (Signed)
Anesthesia Post Note  Patient: Justin Rose  Procedure(s) Performed: TOTAL KNEE ARTHROPLASTY (Right Knee)     Patient location during evaluation: PACU Anesthesia Type: Spinal Level of consciousness: oriented and awake and alert Pain management: pain level controlled Vital Signs Assessment: post-procedure vital signs reviewed and stable Respiratory status: spontaneous breathing, respiratory function stable and patient connected to nasal cannula oxygen Cardiovascular status: blood pressure returned to baseline and stable Postop Assessment: no headache, no backache and no apparent nausea or vomiting Anesthetic complications: no   No complications documented.  Last Vitals:  Vitals:   09/30/20 1000 09/30/20 1026  BP: (!) 146/93 (!) 138/97  Pulse: (!) 58 60  Resp: 19 16  Temp: 36.4 C 36.7 C  SpO2: 100% 95%    Last Pain:  Vitals:   09/30/20 1026  TempSrc:   PainSc: 0-No pain                 Effie Berkshire

## 2020-09-30 NOTE — Progress Notes (Signed)
Pt says his "arms and hands aren't working" On assessment his grips are strong and equal. Arm movements are weak and he cant get his arms to go where he wants them to go.  Nurse lifts arms up above his head and he cannot hold them up, they drop down to bed.  He also say his vision is "squirrely with things floating around".  His speech is normal. Face looks normal, smile is normal.  [employer] PA is on call and was notified.  He said to continue to assess the patient and call him back if needed.

## 2020-09-30 NOTE — Progress Notes (Signed)
Pt states his dizziness, numbness to elbow, and "strange vision" has improved.  Dr Wynelle Link was in to see the patient.

## 2020-09-30 NOTE — Evaluation (Signed)
Physical Therapy Evaluation Patient Details Name: Joakim Huesman MRN: [id number] DOB: [birth year] Today's Date: 09/30/2020   History of Present Illness  Patient is 86 y.o. male s/p Rt TKA on 09/30/20 with PMH significant for glaucoma, OA.  Clinical Impression  Arzell Catherman is a 86 y.o. male POD 0 s/p Rt TKA. Patient reports independence with mobility at baseline. Patient is now limited by functional impairments (see PT problem list below) and requires min-max assist for bed mobility and was limited to sitting EOB due to lightheadedness. Pt's BP elevated at 139/102 with HR at 76 bpm sitting EOB and Max assist required to return to supine due to impaired focus/sequencing to mobility. Patient instructed in exercise to facilitate ROM and circulation. At EOS pt also noted to have weakness of Lt shoulder compared to Rt, RN notified. Patient will benefit from continued skilled PT interventions to address impairments and progress towards PLOF. Acute PT will follow to progress mobility and stair training in preparation for safe discharge home.     Follow Up Recommendations Follow surgeon's recommendation for DC plan and follow-up therapies;Home health PT;SNF (HHPT vs SNF pending progress with therapy)    Equipment Recommendations  None recommended by PT    Recommendations for Other Services       Precautions / Restrictions Precautions Precautions: Fall Restrictions Weight Bearing Restrictions: No Other Position/Activity Restrictions: WBAT      Mobility  Bed Mobility Overal bed mobility: Needs Assistance Bed Mobility: Supine to Sit;Sit to Supine     Supine to sit: Min assist;HOB elevated;Max assist;+2 for physical assistance Sit to supine: Max assist   General bed mobility comments: Cues for use of bed rail to sit up and assist to raise trunk and scoot to EOB. Pt unable to sequence return to supine and perseverated on feeling of lightheadedness. He was unable to follow cues for ues of UE's to scoot  laterally on bed and Max assist needed to return to supine.    Transfers                 General transfer comment: NT due to lightheadedness.  Ambulation/Gait                Stairs            Wheelchair Mobility    Modified Rankin (Stroke Patients Only)       Balance                                             Pertinent Vitals/Pain Pain Assessment: Faces Faces Pain Scale: Hurts a little bit Pain Location: Rt knee Pain Descriptors / Indicators: Discomfort Pain Intervention(s): Limited activity within patient's tolerance;Monitored during session;Repositioned;Ice applied    Home Living Family/patient expects to be discharged to:: Private residence Living Arrangements: Spouse/significant other Available Help at Discharge: Family Type of Home: House Home Access: Stairs to enter Entrance Stairs-Rails: Left Entrance Stairs-Number of Steps: 3 Home Layout: One level Home Equipment: Walker - 2 wheels;Crutches;Cane - single point;Wheelchair - manual      Prior Function Level of Independence: Independent               Hand Dominance   Dominant Hand: Right    Extremity/Trunk Assessment   Upper Extremity Assessment Upper Extremity Assessment: RUE deficits/detail;LUE deficits/detail RUE Deficits / Details: WFL's 4+/5 for shoulder strength, WNLs for grip RUE Sensation: WNL  RUE Coordination: WNL LUE Deficits / Details: Weakness at shoulder with 3/5, grip strength WNL's LUE Sensation: WNL LUE Coordination: decreased gross motor (pt able to complete finger<>nose but not as quickly as Rt UE)    Lower Extremity Assessment Lower Extremity Assessment: RLE deficits/detail;LLE deficits/detail RLE Deficits / Details: no extensor lag with SLR RLE Sensation: WNL RLE Coordination: WNL LLE Deficits / Details: WNL's LLE Sensation: WNL LLE Coordination: WNL    Cervical / Trunk Assessment Cervical / Trunk Assessment: Normal   Communication   Communication: HOH  Cognition Arousal/Alertness: Awake/alert Behavior During Therapy: WFL for tasks assessed/performed Overall Cognitive Status: Within Functional Limits for tasks assessed                                        General Comments General comments (skin integrity, edema, etc.): BP 130/102 with HR 76 bpm in sitting EOB. pt c/o lightheadedness/dizziness and reported improvement with return to supine.    Exercises Total Joint Exercises Ankle Circles/Pumps: AROM;Both;10 reps;Supine Quad Sets: AROM;Right;5 reps;Supine Heel Slides: AROM;Right;5 reps;Supine   Assessment/Plan    PT Assessment Patient needs continued PT services  PT Problem List Decreased strength;Decreased range of motion;Decreased activity tolerance;Decreased balance;Decreased mobility;Decreased knowledge of use of DME;Decreased safety awareness;Decreased knowledge of precautions       PT Treatment Interventions DME instruction;Gait training;Stair training;Functional mobility training;Therapeutic activities;Therapeutic exercise;Balance training;Patient/family education    PT Goals (Current goals can be found in the Care Plan section)  Acute Rehab PT Goals Patient Stated Goal: stop feeling dizzy, recover, and go home PT Goal Formulation: With patient Time For Goal Achievement: 10/07/20 Potential to Achieve Goals: Good    Frequency 7X/week   Barriers to discharge        Co-evaluation               AM-PAC PT "6 Clicks" Mobility  Outcome Measure Help needed turning from your back to your side while in a flat bed without using bedrails?: A Little Help needed moving from lying on your back to sitting on the side of a flat bed without using bedrails?: A Little Help needed moving to and from a bed to a chair (including a wheelchair)?: A Lot Help needed standing up from a chair using your arms (e.g., wheelchair or bedside chair)?: A Lot Help needed to walk in  hospital room?: A Lot Help needed climbing 3-5 steps with a railing? : A Lot 6 Click Score: 14    End of Session Equipment Utilized During Treatment: Gait belt Activity Tolerance: Patient tolerated treatment well Patient left: in bed;with call bell/phone within reach;with family/visitor present;with SCD's reapplied (rehab tech in room) Nurse Communication: Mobility status;Other (comment) (Lt UE weakness, lightheadedness, and elevated BP) PT Visit Diagnosis: Muscle weakness (generalized) (M62.81);Difficulty in walking, not elsewhere classified (R26.2)    Time: 3570-1779 PT Time Calculation (min) (ACUTE ONLY): 27 min   Charges:   PT Evaluation $PT Eval Low Complexity: 1 Low PT Treatments $Therapeutic Exercise: 8-22 mins       Verner Mould, DPT Acute Rehabilitation Services  Office [phone] Pager [phone]  09/30/2020 1:30 PM

## 2020-09-30 NOTE — Care Plan (Signed)
Ortho Bundle Case Management Note  Patient Details  Name: Justin Rose MRN: [id number] Date of Birth: 09-06-1934  R TKA on 09-30-20 DCP:  Home with wife.  1 story home with 3 ste. DME:  No needs. Has a RW and elevated toilets with grab bars. PT:  [facility].  PT eval scheduled on 10-03-20.                    DME Arranged:  N/A DME Agency:  NA  HH Arranged:  NA HH Agency:  NA  Additional Comments: Please contact me with any questions of if this plan should need to change.  Marianne Sofia, RN,CCM [facility]  [phone] 09/30/2020, 3:13 PM

## 2020-09-30 NOTE — Discharge Instructions (Addendum)
 Frank Aluisio, MD Total Joint Specialist [facility] [address] [place], [place] [address] [phone]  TOTAL KNEE REPLACEMENT POSTOPERATIVE DIRECTIONS    Knee Rehabilitation, Guidelines Following Surgery  Results after knee surgery are often greatly improved when you follow the exercise, range of motion and muscle strengthening exercises prescribed by your doctor. Safety measures are also important to protect the knee from further injury. If any of these exercises cause you to have increased pain or swelling in your knee joint, decrease the amount until you are comfortable again and slowly increase them. If you have problems or questions, call your caregiver or physical therapist for advice.   BLOOD CLOT PREVENTION . Take a 325 mg Aspirin two times a day for three weeks following surgery. Then take an 81 mg Aspirin once a day for three weeks. Then discontinue Aspirin. . You may resume your vitamins/supplements upon discharge from the hospital. . Do not take any NSAIDs (Advil, Aleve, Ibuprofen, Meloxicam, etc.) until you have discontinued the 325 mg Aspirin.  HOME CARE INSTRUCTIONS  . Remove items at home which could result in a fall. This includes throw rugs or furniture in walking pathways.  . ICE to the affected knee as much as tolerated. Icing helps control swelling. If the swelling is well controlled you will be more comfortable and rehab easier. Continue to use ice on the knee for pain and swelling from surgery. You may notice swelling that will progress down to the foot and ankle. This is normal after surgery. Elevate the leg when you are not up walking on it.    . Continue to use the breathing machine which will help keep your temperature down. It is common for your temperature to cycle up and down following surgery, especially at night when you are not up moving around and exerting yourself. The breathing machine keeps your lungs expanded and your  temperature down. . Do not place pillow under the operative knee, focus on keeping the knee straight while resting  DIET You may resume your previous home diet once you are discharged from the hospital.  DRESSING / WOUND CARE / SHOWERING . Keep your bulky bandage on for 2 days. On the third post-operative day you may remove the Ace bandage and gauze. There is a waterproof adhesive bandage on your skin which will stay in place until your first follow-up appointment. Once you remove this you will not need to place another bandage . You may begin showering 3 days following surgery, but do not submerge the incision under water.  ACTIVITY For the first 5 days, the key is rest and control of pain and swelling . Do your home exercises twice a day starting on post-operative day 3. On the days you go to physical therapy, just do the home exercises once that day. . You should rest, ice and elevate the leg for 50 minutes out of every hour. Get up and walk/stretch for 10 minutes per hour. After 5 days you can increase your activity slowly as tolerated. . Walk with your walker as instructed. Use the walker until you are comfortable transitioning to a cane. Walk with the cane in the opposite hand of the operative leg. You may discontinue the cane once you are comfortable and walking steadily. . Avoid periods of inactivity such as sitting longer than an hour when not asleep. This helps prevent blood clots.  . You may discontinue the knee immobilizer once you are able to perform a straight   leg raise while lying down. . You may resume a sexual relationship in one month or when given the OK by your doctor.  . You may return to work once you are cleared by your doctor.  . Do not drive a car for 6 weeks or until released by your surgeon.  . Do not drive while taking narcotics.  TED HOSE STOCKINGS Wear the elastic stockings on both legs for three weeks following surgery during the day. You may remove them at night  for sleeping.  WEIGHT BEARING Weight bearing as tolerated with assist device (walker, cane, etc) as directed, use it as long as suggested by your surgeon or therapist, typically at least 4-6 weeks.  POSTOPERATIVE CONSTIPATION PROTOCOL Constipation - defined medically as fewer than three stools per week and severe constipation as less than one stool per week.  One of the most common issues patients have following surgery is constipation.  Even if you have a regular bowel pattern at home, your normal regimen is likely to be disrupted due to multiple reasons following surgery.  Combination of anesthesia, postoperative narcotics, change in appetite and fluid intake all can affect your bowels.  In order to avoid complications following surgery, here are some recommendations in order to help you during your recovery period.  . Colace (docusate) - Pick up an over-the-counter form of Colace or another stool softener and take twice a day as long as you are requiring postoperative pain medications.  Take with a full glass of water daily.  If you experience loose stools or diarrhea, hold the colace until you stool forms back up. If your symptoms do not get better within 1 week or if they get worse, check with your doctor. . Dulcolax (bisacodyl) - Pick up over-the-counter and take as directed by the product packaging as needed to assist with the movement of your bowels.  Take with a full glass of water.  Use this product as needed if not relieved by Colace only.  . MiraLax (polyethylene glycol) - Pick up over-the-counter to have on hand. MiraLax is a solution that will increase the amount of water in your bowels to assist with bowel movements.  Take as directed and can mix with a glass of water, juice, soda, coffee, or tea. Take if you go more than two days without a movement. Do not use MiraLax more than once per day. Call your doctor if you are still constipated or irregular after using this medication for 7 days  in a row.  If you continue to have problems with postoperative constipation, please contact the office for further assistance and recommendations.  If you experience "the worst abdominal pain ever" or develop nausea or vomiting, please contact the office immediatly for further recommendations for treatment.  ITCHING If you experience itching with your medications, try taking only a single pain pill, or even half a pain pill at a time.  You can also use Benadryl over the counter for itching or also to help with sleep.   MEDICATIONS See your medication summary on the "After Visit Summary" that the nursing staff will review with you prior to discharge.  You may have some home medications which will be placed on hold until you complete the course of blood thinner medication.  It is important for you to complete the blood thinner medication as prescribed by your surgeon.  Continue your approved medications as instructed at time of discharge.  PRECAUTIONS . If you experience chest pain or shortness of   breath - call 911 immediately for transfer to the hospital emergency department.  . If you develop a fever greater that 101 F, purulent drainage from wound, increased redness or drainage from wound, foul odor from the wound/dressing, or calf pain - CONTACT YOUR SURGEON.                                                   FOLLOW-UP APPOINTMENTS Make sure you keep all of your appointments after your operation with your surgeon and caregivers. You should call the office at the above phone number and make an appointment for approximately two weeks after the date of your surgery or on the date instructed by your surgeon outlined in the "After Visit Summary".  RANGE OF MOTION AND STRENGTHENING EXERCISES  Rehabilitation of the knee is important following a knee injury or an operation. After just a few days of immobilization, the muscles of the thigh which control the knee become weakened and shrink (atrophy). Knee  exercises are designed to build up the tone and strength of the thigh muscles and to improve knee motion. Often times heat used for twenty to thirty minutes before working out will loosen up your tissues and help with improving the range of motion but do not use heat for the first two weeks following surgery. These exercises can be done on a training (exercise) mat, on the floor, on a table or on a bed. Use what ever works the best and is most comfortable for you Knee exercises include:  . Leg Lifts - While your knee is still immobilized in a splint or cast, you can do straight leg raises. Lift the leg to 60 degrees, hold for 3 sec, and slowly lower the leg. Repeat 10-20 times 2-3 times daily. Perform this exercise against resistance later as your knee gets better.  . Quad and Hamstring Sets - Tighten up the muscle on the front of the thigh (Quad) and hold for 5-10 sec. Repeat this 10-20 times hourly. Hamstring sets are done by pushing the foot backward against an object and holding for 5-10 sec. Repeat as with quad sets.   Leg Slides: Lying on your back, slowly slide your foot toward your buttocks, bending your knee up off the floor (only go as far as is comfortable). Then slowly slide your foot back down until your leg is flat on the floor again.  Angel Wings: Lying on your back spread your legs to the side as far apart as you can without causing discomfort.  A rehabilitation program following serious knee injuries can speed recovery and prevent re-injury in the future due to weakened muscles. Contact your doctor or a physical therapist for more information on knee rehabilitation.   IF YOU ARE TRANSFERRED TO A SKILLED REHAB FACILITY If the patient is transferred to a skilled rehab facility following release from the hospital, a list of the current medications will be sent to the facility for the patient to continue.  When discharged from the skilled rehab facility, please have the facility set up the  patient's Home Health Physical Therapy prior to being released. Also, the skilled facility will be responsible for providing the patient with their medications at time of release from the facility to include their pain medication, the muscle relaxants, and their blood thinner medication. If the patient is still at the   rehab facility at time of the two week follow up appointment, the skilled rehab facility will also need to assist the patient in arranging follow up appointment in our office and any transportation needs.  MAKE SURE YOU:  . Understand these instructions.  . Get help right away if you are not doing well or get worse.   DENTAL ANTIBIOTICS:  In most cases prophylactic antibiotics for Dental procdeures after total joint surgery are not necessary.  Exceptions are as follows:  1. History of prior total joint infection  2. Severely immunocompromised (Organ Transplant, cancer chemotherapy, Rheumatoid biologic meds such as Humera)  3. Poorly controlled diabetes (A1C &gt; 8.0, blood glucose over 200)  If you have one of these conditions, contact your surgeon for an antibiotic prescription, prior to your dental procedure.    Pick up stool softner and laxative for home use following surgery while on pain medications. Do not submerge incision under water. Please use good hand washing techniques while changing dressing each day. May shower starting three days after surgery. Please use a clean towel to pat the incision dry following showers. Continue to use ice for pain and swelling after surgery. Do not use any lotions or creams on the incision until instructed by your surgeon.  

## 2020-09-30 NOTE — Progress Notes (Signed)
Patient complaints of hand tremors, arm weakness, visual disturbance, and inability to grasp objects with reduced fine motor skills in hands. Face and lips/smile symmetrical. Grips strong.  Paged [facility].

## 2020-09-30 NOTE — Anesthesia Procedure Notes (Addendum)
Spinal  Start time: 09/30/2020 7:18 AM End time: 09/30/2020 7:20 AM Staffing Performed: anesthesiologist  Anesthesiologist: Effie Berkshire, MD Preanesthetic Checklist Completed: patient identified, IV checked, site marked, risks and benefits discussed, surgical consent, monitors and equipment checked, pre-op evaluation and timeout performed Spinal Block Patient position: sitting Prep: DuraPrep and site prepped and draped Patient monitoring: heart rate, cardiac monitor, continuous pulse ox and blood pressure Approach: midline Location: L3-4 Injection technique: single-shot Needle Needle type: Pencan  Needle gauge: 24 G Assessment Sensory level: T6 Additional Notes 1st attempt by CRNA: [phone]  2nd placed by MDA

## 2020-09-30 NOTE — Op Note (Signed)
OPERATIVE REPORT-TOTAL KNEE ARTHROPLASTY   Pre-operative diagnosis- Osteoarthritis  Right knee(s)  Post-operative diagnosis- Osteoarthritis Right knee(s)  Procedure-  Right  Total Knee Arthroplasty  Surgeon- Dione Plover. Esra Frankowski, MD  Assistant- Theresa Duty, PA-C   Anesthesia-  Adductor canal block and spinal  EBL-100 mL   Drains None  Tourniquet time-  Total Tourniquet Time Documented: Thigh (Right) - 33 minutes Total: Thigh (Right) - 33 minutes     Complications- None  Condition-PACU - hemodynamically stable.   Brief Clinical Note  Jessejames Correia is a 86 y.o. year old male with end stage OA of his right knee with progressively worsening pain and dysfunction. He has constant pain, with activity and at rest and significant functional deficits with difficulties even with ADLs. He has had extensive non-op management including analgesics, injections of cortisone and viscosupplements, and home exercise program, but remains in significant pain with significant dysfunction. Radiographs show bone on bone arthritis medial and patellofemoral. He presents now for right Total Knee Arthroplasty.    Procedure in detail---   The patient is brought into the operating room and positioned supine on the operating table. After successful administration of  Adductor canal block and spinal,   a tourniquet is placed high on the  Right thigh(s) and the lower extremity is prepped and draped in the usual sterile fashion. Time out is performed by the operating team and then the  Right lower extremity is wrapped in Esmarch, knee flexed and the tourniquet inflated to 300 mmHg.       A midline incision is made with a ten blade through the subcutaneous tissue to the level of the extensor mechanism. A fresh blade is used to make a medial parapatellar arthrotomy. Soft tissue over the proximal medial tibia is subperiosteally elevated to the joint line with a knife and into the semimembranosus bursa with a Cobb  elevator. Soft tissue over the proximal lateral tibia is elevated with attention being paid to avoiding the patellar tendon on the tibial tubercle. The patella is everted, knee flexed 90 degrees and the ACL and PCL are removed. Findings are bone on bone medial and patellofemoral with large global osteophytes.        The drill is used to create a starting hole in the distal femur and the canal is thoroughly irrigated with sterile saline to remove the fatty contents. The 5 degree Right  valgus alignment guide is placed into the femoral canal and the distal femoral cutting block is pinned to remove 10 mm off the distal femur. Resection is made with an oscillating saw.      The tibia is subluxed forward and the menisci are removed. The extramedullary alignment guide is placed referencing proximally at the medial aspect of the tibial tubercle and distally along the second metatarsal axis and tibial crest. The block is pinned to remove 39mm off the more deficient medial  side. Resection is made with an oscillating saw. Size 5is the most appropriate size for the tibia and the proximal tibia is prepared with the modular drill and keel punch for that size.      The femoral sizing guide is placed and size 5 is most appropriate. Rotation is marked off the epicondylar axis and confirmed by creating a rectangular flexion gap at 90 degrees. The size 5 cutting block is pinned in this rotation and the anterior, posterior and chamfer cuts are made with the oscillating saw. The intercondylar block is then placed and that cut is made.  Trial size 5 tibial component, trial size 5 posterior stabilized femur and a 10  mm posterior stabilized rotating platform insert trial is placed. Full extension is achieved with excellent varus/valgus and anterior/posterior balance throughout full range of motion. The patella is everted and thickness measured to be 23  mm. Free hand resection is taken to 12 mm, a 41 template is placed, lug holes  are drilled, trial patella is placed, and it tracks normally. Osteophytes are removed off the posterior femur with the trial in place. All trials are removed and the cut bone surfaces prepared with pulsatile lavage. Cement is mixed and once ready for implantation, the size 5 tibial implant, size  5 posterior stabilized femoral component, and the size 41 patella are cemented in place and the patella is held with the clamp. The trial insert is placed and the knee held in full extension. The Exparel (20 ml mixed with 60 ml saline) is injected into the extensor mechanism, posterior capsule, medial and lateral gutters and subcutaneous tissues.  All extruded cement is removed and once the cement is hard the permanent 10 mm posterior stabilized rotating platform insert is placed into the tibial tray.      The wound is copiously irrigated with saline solution and the extensor mechanism closed with # 0 Stratofix suture. The tourniquet is released for a total tourniquet time of 33  minutes. Flexion against gravity is 140 degrees and the patella tracks normally. Subcutaneous tissue is closed with 2.0 vicryl and subcuticular with running 4.0 Monocryl. The incision is cleaned and dried and steri-strips and a bulky sterile dressing are applied. The limb is placed into a knee immobilizer and the patient is awakened and transported to recovery in stable condition.      Please note that a surgical assistant was a medical necessity for this procedure in order to perform it in a safe and expeditious manner. Surgical assistant was necessary to retract the ligaments and vital neurovascular structures to prevent injury to them and also necessary for proper positioning of the limb to allow for anatomic placement of the prosthesis.   Dione Plover Kenson Groh, MD    09/30/2020, 8:19 AM

## 2020-09-30 NOTE — Anesthesia Preprocedure Evaluation (Addendum)
Anesthesia Evaluation  Patient identified by MRN, date of birth, ID band Patient awake    Reviewed: Allergy & Precautions, NPO status , Patient's Chart, lab work & pertinent test results  Airway Mallampati: I  TM Distance: >3 FB Neck ROM: Full    Dental  (+) Teeth Intact, Dental Advisory Given   Pulmonary former smoker,    breath sounds clear to auscultation       Cardiovascular negative cardio ROS   Rhythm:Regular Rate:Normal     Neuro/Psych negative neurological ROS  negative psych ROS   GI/Hepatic negative GI ROS, Neg liver ROS,   Endo/Other  negative endocrine ROS  Renal/GU negative Renal ROS     Musculoskeletal  (+) Arthritis ,   Abdominal Normal abdominal exam  (+)   Peds  Hematology negative hematology ROS (+)   Anesthesia Other Findings   Reproductive/Obstetrics                            Anesthesia Physical Anesthesia Plan  ASA: III  Anesthesia Plan: Spinal   Post-op Pain Management:  Regional for Post-op pain   Induction: Intravenous  PONV Risk Score and Plan: 2 and Ondansetron and Propofol infusion  Airway Management Planned: Natural Airway and Simple Face Mask  Additional Equipment: None  Intra-op Plan:   Post-operative Plan:   Informed Consent: I have reviewed the patients History and Physical, chart, labs and discussed the procedure including the risks, benefits and alternatives for the proposed anesthesia with the patient or authorized representative who has indicated his/her understanding and acceptance.       Plan Discussed with: CRNA  Anesthesia Plan Comments: (Lab Results      Component                Value               Date                      WBC                      5.7                 09/20/2020                HGB                      13.9                09/20/2020                HCT                      43.1                09/20/2020                 MCV                      96.9                09/20/2020                PLT                      171  09/20/2020           )       Anesthesia Quick Evaluation

## 2020-09-30 NOTE — Progress Notes (Signed)
Orthopedic Tech Progress Note Patient Details:  Justin Rose 03-02-1934 [id number]  CPM Right Knee CPM Right Knee: On Right Knee Flexion (Degrees): 40 Right Knee Extension (Degrees): 10  Post Interventions Patient Tolerated: Well  [name] 09/30/2020, 9:46 AM

## 2020-09-30 NOTE — Interval H&P Note (Signed)
History and Physical Interval Note:  09/30/2020 6:26 AM  Justin Rose  has presented today for surgery, with the diagnosis of right knee osteoarthritis.  The various methods of treatment have been discussed with the patient and family. After consideration of risks, benefits and other options for treatment, the patient has consented to  Procedure(s): TOTAL KNEE ARTHROPLASTY (Right) as a surgical intervention.  The patient's history has been reviewed, patient examined, no change in status, stable for surgery.  I have reviewed the patient's chart and labs.  Questions were answered to the patient's satisfaction.     Pilar Plate Andrey Mccaskill

## 2020-09-30 NOTE — Consult Note (Addendum)
[facility] Medical Consultation  Ozzie Knobel XIP:[id number] DOB: Jul 04, 1934 DOA: 09/30/2020 PCP: Karlene Einstein, MD   Requesting physician: Guinevere Scarlet PA for Dr. Maureen Ralphs Date of consultation: 09/30/20 Reason for consultation: Headache, tremor, incoordination  Impression/Recommendations Principal Problem:   OA (osteoarthritis) of knee Active Problems:   Discoordination   Primary osteoarthritis of right knee    1. Neuro - patient with onset of frontal HA and discoordination first reported at 13:30 hrs. He reports inability to dial his cell phone. Reports progressive frontal HA, the worst HA of his life.  Plan MRI brain r/o intracranial hemorrhage, r/o posterior circulation CVA  Further recommendations to follow  Addendum: MRI not available at [facility] overnight - order changed to routine MRI brain 0700; STAT CT head ordered to r/o intracranial hemorrhage. Dr. Malen Gauze is assisting.   [place] in-patient team will followup again tomorrow. Please contact me if I can be of assistance in the meanwhile. Thank you for this consultation.  Chief Complaint: frontal HA progressive through the day. Not a thunderclap HA. Discoordinaton hands.  HPI:  Patient admitted for right TKR performed earlier today. When he was sat up post-op he had the onset of a frontal HA that has become progressively worse during the day. At that time he developed visual changes both eyes described as "squiggles" in his visual field but no loss of vision. He also developed incoordination of hand movements: he could not dial his cell phone or answer it, could not use bedside phone. He felt tremulous. Nursing staff reported some UE weakness. Due to persistent and progressive symptoms [facility] called to evaluate the patient. .  Review of Systems:  Cardiac- no chest pain, palpitations Resp - no SOB, cough, wheezing GI - no abdominal pain, no N/V except when he sits up GU - no c/o MSK - pain right knee post op. Back pain. Neuro - per  HPI  Past Medical History:  Diagnosis Date  . Arthritis   . Basal cell carcinoma (BCC) of head   . Basal cell carcinoma, ear    Left ear  . Glaucoma    Left eye   Past Surgical History:  Procedure Laterality Date  . BACK SURGERY     2021  . BASAL CELL CARCINOMA EXCISION    . EYE SURGERY     08/2020 left eye  . HAND SURGERY    . PARATHYROIDECTOMY    . TONSILLECTOMY     Soc Hx - married 40 years. 3 children. Six grand-children but two have died. 4 great-grandchildren. 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 Lives with his wife.   Social History:  reports that he has quit smoking. He has a 4.00 pack-year smoking history. He has never used smokeless tobacco. He reports that he does not drink alcohol and does not use drugs.  No Known Allergies History reviewed. No pertinent family history.  Prior to Admission medications   Medication Sig Start Date End Date Taking? Authorizing Provider  brimonidine-timolol (COMBIGAN) 0.2-0.5 % ophthalmic solution Place 1 drop into the left eye daily.   Yes [provider]  cetirizine (ZYRTEC) 10 MG tablet Take 10 mg by mouth daily.   Yes [provider]  cholecalciferol (VITAMIN D3) 25 MCG (1000 UNIT) tablet Take 1,000 Units by mouth daily.   Yes [provider]  GLUCOSAMINE-CHONDROITIN PO Take 1 tablet by mouth daily.   Yes [provider]  ipratropium (ATROVENT) 0.06 % nasal spray Place 2 sprays into both nostrils daily.  08/20/20  Yes [provider]  MAGNESIUM PO Take 1 tablet by mouth daily.   Yes [provider]  meloxicam (MOBIC) 15 MG tablet Take 15 mg by mouth daily. 07/04/20  Yes [provider]  vitamin B-12 (CYANOCOBALAMIN) 1000 MCG tablet Take 1,000 mcg by mouth daily.   Yes [provider]   Physical Exam: Blood pressure (!) 156/106, pulse 94, temperature 98.4 F (36.9 C), temperature source Oral, resp. rate 17, height 5\' 11"  (1.803 m), weight  85.5 kg, SpO2 97 %. Vitals:   09/30/20 1541 09/30/20 2153  BP: 126/76 (!) 156/106  Pulse: 73 94  Resp:  17  Temp:  98.4 F (36.9 C)  SpO2: 97% 97%     General:  WNWD man who is uncomfortable but in no distress  Eyes: C&S clear.  ENT: no oral lesion  Neck: supple, no mass, no thyromegaly  Cardiovascular: 2_+ radial pulse, RRR, w/o mm/r/g  Respiratory: normal respirations. Lungs clear  Abdomen: BS+, non-tender, no masses  Skin: Clear  Musculoskeletal: right knee in dressing post TKR, missing distal segment right middle finger, enalarged PIP joint thumb and index fingers  Psychiatric: A&O x 3, mildly anxious  Neurologic: CN - normal facial symmetry and movement, PERRLA, EOMI, no visual field cuts except long-standing left superior blind spot, no diploplia, nl shoulder shrug. MS - 5/5 grips, UE strength, left LE strength. DTRs- 2+ symmetrical - right knee not tested. Normal rapid finger movement but poor serial finger movement. Sensation to light touch preserved.   Labs on Admission:  Basic Metabolic Panel: No results for input(s): NA, K, CL, CO2, GLUCOSE, BUN, CREATININE, CALCIUM, MG, PHOS in the last 168 hours. Liver Function Tests: No results for input(s): AST, ALT, ALKPHOS, BILITOT, PROT, ALBUMIN in the last 168 hours. No results for input(s): LIPASE, AMYLASE in the last 168 hours. No results for input(s): AMMONIA in the last 168 hours. CBC: No results for input(s): WBC, NEUTROABS, HGB, HCT, MCV, PLT in the last 168 hours. Cardiac Enzymes: No results for input(s): CKTOTAL, CKMB, CKMBINDEX, TROPONINI in the last 168 hours. BNP: Invalid input(s): POCBNP CBG: No results for input(s): GLUCAP in the last 168 hours.  Radiological Exams on Admission: No results found.  EKG: Independently reviewed. No EKG available  Time spent: 70 minutes  Adella Hare [facility] Pager [phone]  If 7PM-7AM, please contact night-coverage [email or web address] Password  [facility] 09/30/2020, 11:52 PM

## 2020-09-30 NOTE — Progress Notes (Signed)
Notified by Justin Kussmaul, RN around 1:30 pm that Justin Rose was experiencing dizziness and left elbow numbness, with reported visual floaters. BP was slightly elevated, last recorded approximately 130s/90s. BP did not drop with PT.   Upon further discussion with RN, patient is alert and oriented. Smile even, equal hand grip strength. No signs of distress or cognition delays. She also reports that dizziness, numbness and visual distu rbance are improved per patient.   It is of note that Justin Rose had cataract surgery within the past 4 weeks. Elbow numbness is also not uncommon given supine positioning in the OR with arms extended.   At this point, do not feel that a neurology consult is warranted. Will continue to monitor, Dr. Wynelle Link will be by to see him for afternoon rounds shortly.   Theresa Duty, PA-C

## 2020-10-01 ENCOUNTER — Ambulatory Visit (HOSPITAL_COMMUNITY): Payer: Medicare HMO

## 2020-10-01 ENCOUNTER — Observation Stay (HOSPITAL_COMMUNITY): Payer: Medicare HMO

## 2020-10-01 DIAGNOSIS — Z791 Long term (current) use of non-steroidal anti-inflammatories (NSAID): Secondary | ICD-10-CM | POA: Diagnosis not present

## 2020-10-01 DIAGNOSIS — M6282 Rhabdomyolysis: Secondary | ICD-10-CM | POA: Diagnosis not present

## 2020-10-01 DIAGNOSIS — N401 Enlarged prostate with lower urinary tract symptoms: Secondary | ICD-10-CM | POA: Diagnosis present

## 2020-10-01 DIAGNOSIS — K5903 Drug induced constipation: Secondary | ICD-10-CM | POA: Diagnosis not present

## 2020-10-01 DIAGNOSIS — R279 Unspecified lack of coordination: Secondary | ICD-10-CM

## 2020-10-01 DIAGNOSIS — R42 Dizziness and giddiness: Secondary | ICD-10-CM | POA: Diagnosis not present

## 2020-10-01 DIAGNOSIS — I6389 Other cerebral infarction: Secondary | ICD-10-CM

## 2020-10-01 DIAGNOSIS — M170 Bilateral primary osteoarthritis of knee: Secondary | ICD-10-CM | POA: Diagnosis present

## 2020-10-01 DIAGNOSIS — H409 Unspecified glaucoma: Secondary | ICD-10-CM | POA: Diagnosis present

## 2020-10-01 DIAGNOSIS — B964 Proteus (mirabilis) (morganii) as the cause of diseases classified elsewhere: Secondary | ICD-10-CM | POA: Diagnosis not present

## 2020-10-01 DIAGNOSIS — I951 Orthostatic hypotension: Secondary | ICD-10-CM | POA: Diagnosis not present

## 2020-10-01 DIAGNOSIS — I639 Cerebral infarction, unspecified: Secondary | ICD-10-CM | POA: Diagnosis not present

## 2020-10-01 DIAGNOSIS — N390 Urinary tract infection, site not specified: Secondary | ICD-10-CM | POA: Diagnosis not present

## 2020-10-01 DIAGNOSIS — Z79899 Other long term (current) drug therapy: Secondary | ICD-10-CM | POA: Diagnosis not present

## 2020-10-01 DIAGNOSIS — I82441 Acute embolism and thrombosis of right tibial vein: Secondary | ICD-10-CM | POA: Diagnosis not present

## 2020-10-01 DIAGNOSIS — M1710 Unilateral primary osteoarthritis, unspecified knee: Secondary | ICD-10-CM | POA: Diagnosis present

## 2020-10-01 DIAGNOSIS — Z6826 Body mass index (BMI) 26.0-26.9, adult: Secondary | ICD-10-CM | POA: Diagnosis not present

## 2020-10-01 DIAGNOSIS — M6080 Other myositis, unspecified site: Secondary | ICD-10-CM | POA: Diagnosis not present

## 2020-10-01 DIAGNOSIS — Z96659 Presence of unspecified artificial knee joint: Secondary | ICD-10-CM | POA: Diagnosis not present

## 2020-10-01 DIAGNOSIS — Z96651 Presence of right artificial knee joint: Secondary | ICD-10-CM | POA: Diagnosis not present

## 2020-10-01 DIAGNOSIS — M609 Myositis, unspecified: Secondary | ICD-10-CM | POA: Diagnosis not present

## 2020-10-01 DIAGNOSIS — R29700 NIHSS score 0: Secondary | ICD-10-CM | POA: Diagnosis not present

## 2020-10-01 DIAGNOSIS — D72829 Elevated white blood cell count, unspecified: Secondary | ICD-10-CM | POA: Diagnosis not present

## 2020-10-01 DIAGNOSIS — I6340 Cerebral infarction due to embolism of unspecified cerebral artery: Secondary | ICD-10-CM | POA: Diagnosis not present

## 2020-10-01 DIAGNOSIS — H538 Other visual disturbances: Secondary | ICD-10-CM | POA: Diagnosis present

## 2020-10-01 DIAGNOSIS — G8918 Other acute postprocedural pain: Secondary | ICD-10-CM | POA: Diagnosis not present

## 2020-10-01 DIAGNOSIS — R339 Retention of urine, unspecified: Secondary | ICD-10-CM | POA: Diagnosis not present

## 2020-10-01 DIAGNOSIS — Z85828 Personal history of other malignant neoplasm of skin: Secondary | ICD-10-CM | POA: Diagnosis not present

## 2020-10-01 DIAGNOSIS — Z87891 Personal history of nicotine dependence: Secondary | ICD-10-CM | POA: Diagnosis not present

## 2020-10-01 DIAGNOSIS — E785 Hyperlipidemia, unspecified: Secondary | ICD-10-CM | POA: Diagnosis present

## 2020-10-01 DIAGNOSIS — E871 Hypo-osmolality and hyponatremia: Secondary | ICD-10-CM | POA: Diagnosis not present

## 2020-10-01 DIAGNOSIS — E669 Obesity, unspecified: Secondary | ICD-10-CM | POA: Diagnosis present

## 2020-10-01 DIAGNOSIS — T466X5A Adverse effect of antihyperlipidemic and antiarteriosclerotic drugs, initial encounter: Secondary | ICD-10-CM | POA: Diagnosis not present

## 2020-10-01 DIAGNOSIS — M1711 Unilateral primary osteoarthritis, right knee: Secondary | ICD-10-CM | POA: Diagnosis not present

## 2020-10-01 DIAGNOSIS — D62 Acute posthemorrhagic anemia: Secondary | ICD-10-CM | POA: Diagnosis not present

## 2020-10-01 LAB — CBC
HCT: 35.1 % — ABNORMAL LOW (ref 39.0–52.0)
Hemoglobin: 11.5 g/dL — ABNORMAL LOW (ref 13.0–17.0)
MCH: 31 pg (ref 26.0–34.0)
MCHC: 32.8 g/dL (ref 30.0–36.0)
MCV: 94.6 fL (ref 80.0–100.0)
Platelets: 203 10*3/uL (ref 150–400)
RBC: 3.71 MIL/uL — ABNORMAL LOW (ref 4.22–5.81)
RDW: 12.9 % (ref 11.5–15.5)
WBC: 15.7 10*3/uL — ABNORMAL HIGH (ref 4.0–10.5)
nRBC: 0 % (ref 0.0–0.2)

## 2020-10-01 LAB — LIPID PANEL
Cholesterol: 115 mg/dL (ref 0–200)
HDL: 26 mg/dL — ABNORMAL LOW (ref >40)
LDL Cholesterol: 74 mg/dL (ref 0–99)
Total CHOL/HDL Ratio: 4.4 RATIO
Triglycerides: 75 mg/dL (ref <150)
VLDL: 15 mg/dL (ref 0–40)

## 2020-10-01 LAB — ECHOCARDIOGRAM COMPLETE
Area-P 1/2: 2.73 cm2
Height: 71 in
S' Lateral: 2.86 cm
Weight: 3017.59 oz

## 2020-10-01 LAB — HEMOGLOBIN A1C
Hgb A1c MFr Bld: 5.6 % (ref 4.8–5.6)
Mean Plasma Glucose: 114.02 mg/dL

## 2020-10-01 LAB — TROPONIN I (HIGH SENSITIVITY)
Troponin I (High Sensitivity): 4 ng/L (ref <18)
Troponin I (High Sensitivity): 6 ng/L (ref <18)
Troponin I (High Sensitivity): 10 ng/L (ref <18)

## 2020-10-01 LAB — BASIC METABOLIC PANEL
Anion gap: 12 (ref 5–15)
BUN: 16 mg/dL (ref 8–23)
CO2: 20 mmol/L — ABNORMAL LOW (ref 22–32)
Calcium: 9 mg/dL (ref 8.9–10.3)
Chloride: 105 mmol/L (ref 98–111)
Creatinine, Ser: 0.81 mg/dL (ref 0.61–1.24)
GFR, Estimated: >60 mL/min (ref >60)
Glucose, Bld: 173 mg/dL — ABNORMAL HIGH (ref 70–99)
Potassium: 4.1 mmol/L (ref 3.5–5.1)
Sodium: 137 mmol/L (ref 135–145)

## 2020-10-01 LAB — GLUCOSE, CAPILLARY: Glucose-Capillary: 167 mg/dL — ABNORMAL HIGH (ref 70–99)

## 2020-10-01 LAB — TSH: TSH: 0.397 u[IU]/mL (ref 0.350–4.500)

## 2020-10-01 MED ORDER — IOHEXOL 350 MG/ML SOLN
100.0000 mL | Freq: Once | INTRAVENOUS | Status: AC | PRN
  Administered 2020-10-01: 100 mL via INTRAVENOUS

## 2020-10-01 MED ORDER — STROKE: EARLY STAGES OF RECOVERY BOOK
Freq: Once | Status: AC
  Filled 2020-10-01: qty 1

## 2020-10-01 MED ORDER — METOPROLOL TARTRATE 50 MG PO TABS
50.0000 mg | ORAL_TABLET | Freq: Once | ORAL | Status: DC
  Filled 2020-10-01: qty 1

## 2020-10-01 MED ORDER — LIP MEDEX EX OINT
TOPICAL_OINTMENT | CUTANEOUS | Status: DC | PRN
  Filled 2020-10-01: qty 7

## 2020-10-01 MED ORDER — KETOROLAC TROMETHAMINE 15 MG/ML IJ SOLN
15.0000 mg | Freq: Once | INTRAMUSCULAR | Status: AC
  Administered 2020-10-01: 15 mg via INTRAVENOUS
  Filled 2020-10-01: qty 1

## 2020-10-01 MED ORDER — PROPOFOL 10 MG/ML IV BOLUS
INTRAVENOUS | Status: AC
  Filled 2020-10-01: qty 20

## 2020-10-01 MED ORDER — OXYMETAZOLINE HCL 0.05 % NA SOLN
NASAL | Status: AC
  Filled 2020-10-01: qty 30

## 2020-10-01 NOTE — Progress Notes (Signed)
Patient having chest pain and confusion. Rapid response called.

## 2020-10-01 NOTE — Progress Notes (Signed)
Justin Rose is doing better this afternoon. He has knee pain as expected but it is being controlled with the oxycodone  His right knee has mild swelling. Thee is minimal bloody drainage on the Aquacell dressing. He is moving his right foot normally without difficulty  He has near normal grip strength on the left hand and is 4-5/5 in strength throughout the left arm  A/P Right TKA with post-op embolic event- most likely fat emboli per neuro-- He is doing better at this time. He is being transferred to [facility] for closer neuro observation and then possibly rehab for his recovery. We will be checking on him at [facility]

## 2020-10-01 NOTE — Progress Notes (Signed)
Patient transported to CT 

## 2020-10-01 NOTE — Progress Notes (Addendum)
   Subjective: 1 Day Post-Op Procedure(s) (LRB): TOTAL KNEE ARTHROPLASTY (Right) Patient seen in rounds for Dr. Wynelle Rose. Patient reports pain as mild. Alert and oriented this AM. Headache is improved upon assessment this AM. Reports visual disturbances have dissipated, vision is normal. Seems disconnected this AM, speech intact but struggles to find words when talking. Knee pain is mild. States his arm function has improved as well. When asked to squeeze my fingers, he squeezed the blanket instead.   Objective: Vital signs in last 24 hours: Temp:  [97.6 F (36.4 C)-98.7 F (37.1 C)] 98.2 F (36.8 C) (11/16 0412) Pulse Rate:  [54-94] 85 (11/16 0412) Resp:  [10-21] 18 (11/16 0412) BP: (126-156)/(76-106) 126/83 (11/16 0412) SpO2:  [86 %-100 %] 97 % (11/16 0412)  Intake/Output from previous day:  Intake/Output Summary (Last 24 hours) at 10/01/2020 0810 Last data filed at 10/01/2020 0405 Gross per 24 hour  Intake 3221.89 ml  Output 3350 ml  Net -128.11 ml    Labs: Recent Labs    10/01/20 0113  HGB 11.5*   Recent Labs    10/01/20 0113  WBC 15.7*  RBC 3.71*  HCT 35.1*  PLT 203   Recent Labs    10/01/20 0113  NA 137  K 4.1  CL 105  CO2 20*  BUN 16  CREATININE 0.81  GLUCOSE 173*  CALCIUM 9.0   Exam: General - Patient is Alert and Appropriate Extremity - Neurologically intact Neurovascular intact Sensation intact distally Dorsiflexion/Plantar flexion intact Dressing - dressing C/D/I Motor Function - intact, moving foot and toes well on exam.   Past Medical History:  Diagnosis Date  . Arthritis   . Basal cell carcinoma (BCC) of head   . Basal cell carcinoma, ear    Left ear  . Glaucoma    Left eye    Assessment/Plan: 1 Day Post-Op Procedure(s) (LRB): TOTAL KNEE ARTHROPLASTY (Right) Principal Problem:   OA (osteoarthritis) of knee Active Problems:   Primary osteoarthritis of right knee   Discoordination   Primary osteoarthritis of knee  Estimated  body mass index is 26.3 kg/m as calculated from the following:   Height as of this encounter: 5\' 11"  (1.803 m).   Weight as of this encounter: 85.5 kg. Up with therapy  Anticipated LOS equal to or greater than 2 midnights due to - Age 84 and older with one or more of the following:  - Obesity  - Expected need for hospital services (PT, OT, Nursing) required for safe  discharge  - Anticipated need for postoperative skilled nursing care or inpatient rehab  - Active co-morbidities: None OR   - Unanticipated findings during/Post Surgery: None  - Patient is a high risk of re-admission due to: None   DVT Prophylaxis - Aspirin, SCDs, TED hose Weight bearing as tolerated. Continue therapy.  Plan is to go Home after hospital stay.  Stat CT overnight was negative. Troponins negative. 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  Will follow neurology/hospitalists recommendations. Very appreciative of their assistance in caring for Justin Rose, [facility] [phone] 10/01/2020, 8:10 AM

## 2020-10-01 NOTE — Progress Notes (Signed)
Rehab Admissions Coordinator Note:  Patient was screened by Cleatrice Burke for appropriateness for an Inpatient Acute Rehab Consult per therapy recommendations. At this time, we are recommending Inpatient Rehab consult. I have placed a call to Leonides Grills, RN [facility] to obtain approval to place rehab consult. I await call back.   Cleatrice Burke RN MSN 10/01/2020, 1:09 PM  I can be reached at [phone].

## 2020-10-01 NOTE — Progress Notes (Signed)
Attempted to see Mr Milby this morning but he was off the unit at the MRI scanner. I will return this afternoon to see him. In the interim, my PA Theresa Duty will see him this morning.

## 2020-10-01 NOTE — Progress Notes (Signed)
SLP Cancellation Note  Patient Details Name: Janes Colegrove MRN: [id number] DOB: 12/05/1933   Cancelled treatment:       Reason Eval/Treat Not Completed: Other (comment) Orders received for cognitive/communication evaluation. Chart reviewed and history taken. Will complete evaluation as schedule permits.   Sukari Grist B. Quentin Ore, [facility], [place] Speech Language Pathologist Office: [phone]  [place]ger [phone]  Shonna Chock 10/01/2020, 3:37 PM

## 2020-10-01 NOTE — Significant Event (Addendum)
Rapid Response Event Note   Reason for Call :  Increased right knee pain, new onset of chest pain, with severe headache.   Initial Focused Assessment:  Patient is alert to self,disoreinted to place, situation, time. Complaint of severe right knee pain, chest pain pain  and headache. Able to follow simple commands, raising of arm, grasp are equal but with decreased fine motor skills.  Facial movements are symmetrical. Patient having difficulty with complete sentences, word search, noted word salad, speech is  Garbled, with  incomprehensive words. Patient knows who his wife is [name], and the name of his children).   VSS HR 87, B/p 143/79, SpO2 @ 99 % with [place]@ 2L    Interventions:  Paged Dr. Linda Hedges to come to bedside,  [facility] coverage.  CT head Stat  EKG, Shows SR  Toradol 15 mg  M.Sharlet Salina NP to bedside @ [phone] Troponin stat x3  Transfer to Telemetry    Plan of Care: CT head NOW,  Results show Generalized atrophy and chronic microvascular ischemia without acute intracranial abnormality. Troponin x3, transfer to Telemetry  MRI in AM     Event Summary:  Patient with severe knee pain and headache.  CT of head to R/O bleed:  Scheduled  MRI in the AM. Patient also disoriented upon presentation, Pain has decreased and he is able to be redirected, remains disoriented to place and time. Patient denies chest pain at this time. Will continue to monitor.   MD Notified: DR. Linda HedgesArdith Dark NP @ [phone]  Call Time:  0107 Arrival Time: 0110  End Time: [place] Abdulla Pooley, RN

## 2020-10-01 NOTE — Progress Notes (Signed)
Echocardiogram 2D Echocardiogram has been performed.  Oneal Deputy Sachin Ferencz 10/01/2020, 12:58 PM

## 2020-10-01 NOTE — Progress Notes (Signed)
OT Cancellation Note  Patient Details Name: Silvino Selman MRN: [id number] DOB: 07-05-1934   Cancelled Treatment:    Reason Eval/Treat Not Completed: Other (comment) Upon arrival patient sleeping soundly, spouse request to hold OT eval "he needs his rest, he didn't sleep much last night." Will re-attempt as schedule permits.  Delbert Phenix OT OT pager: [place] 10/01/2020, 2:19 PM

## 2020-10-01 NOTE — Progress Notes (Signed)
Orthopedic Tech Progress Note Patient Details:  Justin Rose 01-13-1934 [id number] Patient getting discharged picked up cpm. Patient ID: Justin Rose, male   DOB: [birth year], 86 y.o.   MRN: [id number]   [name] 10/01/2020, 5:20 PM

## 2020-10-01 NOTE — Progress Notes (Signed)
Phone conversation note Called by Dr. Linda Hedges regarding this patient. Postop from right knee TKR, with headache discoordination and floaters in vision.  Per Dr. Linda Hedges, nonfocal exam. Recommended MRI if possible to rule out ischemic stroke versus bleed.  MRI not available at [facility] long emergently. Ordered stat CT-to rule out ICH. If stat CT head negative, can get routine MRI in the morning. Outside the window for TPA and based on nonfocal exam, not a interventional candidate. Discussed my plan with Dr. Linda Hedges over the phone. -- Amie Portland, MD On-Call Neurohospitalist Pager: [phone] If 7pm to 7am, please call on call as listed on AMION.

## 2020-10-01 NOTE — Progress Notes (Signed)
Patient transferred to room 1442.

## 2020-10-01 NOTE — Progress Notes (Addendum)
Full note to follow. Patient seen and examined. Neurology updated and investigations ordered. Patient currently alert oriented x4, no focal neurological deficits. No cranial nerve deficits, no upper or lower extremity motor or sensory deficits.  Acute multiple vascular territory stroke, suspect embolic stroke: Not interventional candidate, minimal or no neurological deficits. Admit to monitored unit because of severity of symptoms. Neurochecks and vital signs as per stroke protocol. Patient was not a TPA and vascular intervention candidate because of insignificant neurological findings. Diet, bedside swallow evaluation then start diet. Antiplatelets, already on aspirin.. Statin, check LDL. Blood pressure goals, normotensive. No history of blood pressure. Consultations, neurology, speech, PT OT CTA head neck ordered. 2D echocardiogram ordered. Hemoglobin A1c, TSH and fasting lipid panel ordered. Continue to mobilize with PT OT. Since patient does not need acute intervention, may not need to be transferred to [facility]. Will discuss with neurology.   PROGRESS NOTE    Justin Rose  MHD:[id number] DOB: 06/08/1934 DOA: 09/30/2020 PCP: Karlene Einstein, MD    Brief Narrative:  Patient with no significant cardiovascular problem who underwent elective right total knee on 11/15.  By evening he was feeling dizzy when sitting up and later night he had frontal headache, some visual disturbances like he was seeing squiggles but no visual field loss.  Also developed incoordination of hand movements as he was not able to dial his cell phone or answer it.  He felt tremulous. Rapid response called at night, stat CT scan head was normal.  Neurology was consulted.  With no significant neurological deficit, was not intervention candidate.  Subsequent MRI showed multifocal embolic stroke. TRH consulted after rapid response on 11/15 midnight.   Assessment & Plan:   Principal Problem:   Arterial  ischemic stroke, multifocal, multiple vascular territories, acute ([place]) Active Problems:   OA (osteoarthritis) of knee   Primary osteoarthritis of right knee   Discoordination   Primary osteoarthritis of knee  Acute multiple territory ischemic stroke, suspect embolic.   Not interventional candidate, minimal or no neurological deficits. Admit to monitored unit because of severity of symptoms. Neurochecks and vital signs as per stroke protocol. Patient was not a TPA and vascular intervention candidate because of insignificant neurological findings. Diet, bedside swallow evaluation then start diet. Antiplatelets, already on aspirin.  We will continue. Statin, check LDL. Blood pressure goals, normotensive. No history of blood pressure. Consultations, neurology, speech, PT OT CTA head neck, does not show any large vessel occlusion. 2D echocardiogram ordered. Hemoglobin A1c, TSH and fasting lipid panel ordered. Continue to mobilize with PT OT. Discussed with neurology, advised transfer to [facility].  Osteoarthritis status post right total knee: Postop management as per surgery.  Currently adequate pain management and therapies as above.   Thank you for involving [place] in the care of this patient.  We will continue to follow-up and coordinate with neurology to formulate a plan of care for this patient. Arrange transfer to neuro unit at [facility].  He will probably benefit with acute inpatient rehab.  DVT prophylaxis: SCDs Start: 09/30/20 1027 Place TED hose Start: 09/30/20 1027   Code Status: Full code Family Communication: Patient's wife on the phone Disposition Plan: Status is: Inpatient  Remains inpatient appropriate because:Developed acute a stroke after surgery.   Dispo: The patient is from: Home              Anticipated d/c is to: CIR              Anticipated  d/c date is: 2 days              Patient currently is not medically stable to d/c.          Consultants:   TRH  Procedures:   None  Antimicrobials:   None   Subjective: Patient seen and examined.  Overnight events noted.  Case discussed with patient's attending team.  Called and updated neurology team about acute stroke findings and MRI. On my morning evaluation, patient was alert and awake.  He was able to call his wife from the bedside phone.  He denies any symptoms, however he was worried how he will do when he gets up and walk.  He has some pain on the right knee.  He could not right away tell if his hands are weak, but observed some loss of coordination on right hand.  Objective: Vitals:   10/01/20 0130 10/01/20 0135 10/01/20 0200 10/01/20 0412  BP: (!) 152/103 (!) 126/95 (!) 146/87 126/83  Pulse:  79  85  Resp: (!) 21 20  18   Temp:  98.3 F (36.8 C) 98.7 F (37.1 C) 98.2 F (36.8 C)  TempSrc:  Oral Oral Oral  SpO2: (!) 86% 95% 97% 97%  Weight:      Height:        Intake/Output Summary (Last 24 hours) at 10/01/2020 1245 Last data filed at 10/01/2020 0405 Gross per 24 hour  Intake 2189.82 ml  Output 1800 ml  Net 389.82 ml   Filed Weights   09/30/20 0543  Weight: 85.5 kg    Examination:  General exam: Appears calm and comfortable  Slightly anxious.  Not in any distress.  On room air. Respiratory system: Clear to auscultation. Respiratory effort normal. Cardiovascular system: S1 & S2 heard, RRR. No JVD, murmurs, rubs, gallops or clicks. No pedal edema. Gastrointestinal system: Abdomen is nondistended, soft and nontender. No organomegaly or masses felt. Normal bowel sounds heard. Central nervous system: Alert and oriented x4.  He is moving all extremities.  Some limitation due to right knee surgery. Motor power and his strength is equal, however he had some difficulty coordinating with the right hand. Psychiatry: Judgement and insight appear normal. Mood & affect appropriate.  Right knee with immediate postop compression dressing.  Distal  neurovascular status intact.   Data Reviewed: I have personally reviewed following labs and imaging studies  CBC: Recent Labs  Lab 10/01/20 0113  WBC 15.7*  HGB 11.5*  HCT 35.1*  MCV 94.6  PLT 782   Basic Metabolic Panel: Recent Labs  Lab 10/01/20 0113  NA 137  K 4.1  CL 105  CO2 20*  GLUCOSE 173*  BUN 16  CREATININE 0.81  CALCIUM 9.0   GFR: Estimated Creatinine Clearance: 72.3 mL/min (by C-G formula based on SCr of 0.81 mg/dL). Liver Function Tests: No results for input(s): AST, ALT, ALKPHOS, BILITOT, PROT, ALBUMIN in the last 168 hours. No results for input(s): LIPASE, AMYLASE in the last 168 hours. No results for input(s): AMMONIA in the last 168 hours. Coagulation Profile: No results for input(s): INR, PROTIME in the last 168 hours. Cardiac Enzymes: No results for input(s): CKTOTAL, CKMB, CKMBINDEX, TROPONINI in the last 168 hours. BNP (last 3 results) No results for input(s): PROBNP in the last 8760 hours. HbA1C: Recent Labs    10/01/20 0113  HGBA1C 5.6   CBG: Recent Labs  Lab 10/01/20 0112  GLUCAP 167*   Lipid Profile: Recent Labs    10/01/20 0733  CHOL 115  HDL 26*  LDLCALC 74  TRIG 75  CHOLHDL 4.4   Thyroid Function Tests: Recent Labs    10/01/20 0940  TSH 0.397   Anemia Panel: No results for input(s): VITAMINB12, FOLATE, FERRITIN, TIBC, IRON, RETICCTPCT in the last 72 hours. Sepsis Labs: No results for input(s): PROCALCITON, LATICACIDVEN in the last 168 hours.  Recent Results (from the past 240 hour(s))  SARS CORONAVIRUS 2 (TAT 6-24 HRS) Nasopharyngeal Nasopharyngeal Swab     Status: None   Collection Time: 09/26/20  8:45 AM   Specimen: Nasopharyngeal Swab  Result Value Ref Range Status   SARS Coronavirus 2 NEGATIVE NEGATIVE Final    Comment: (NOTE) SARS-CoV-2 target nucleic acids are NOT DETECTED.  The SARS-CoV-2 RNA is generally detectable in upper and lower respiratory specimens during the acute phase of infection.  Negative results do not preclude SARS-CoV-2 infection, do not rule out co-infections with other pathogens, and should not be used as the sole basis for treatment or other patient management decisions. Negative results must be combined with clinical observations, patient history, and epidemiological information. The expected result is Negative.  Fact Sheet for Patients: SugarRoll.be  Fact Sheet for Healthcare Providers: https://www.woods-mathews.com/  This test is not yet approved or cleared by the [place] FDA and  has been authorized for detection and/or diagnosis of SARS-CoV-2 by FDA under an Emergency Use Authorization (EUA). This EUA will remain  in effect (meaning this test can be used) for the duration of the COVID-19 declaration under Se ction 564(b)(1) of the Act, 21 U.S.C. section 360bbb-3(b)(1), unless the authorization is terminated or revoked sooner.  Performed at [facility], [place] [address], [place], [place] [address]          Radiology Studies: CT ANGIO HEAD W OR WO CONTRAST  Result Date: 10/01/2020 CLINICAL DATA:  Acute neuro deficit.  Stroke. EXAM: CT ANGIOGRAPHY HEAD AND NECK TECHNIQUE: Multidetector CT imaging of the head and neck was performed using the standard protocol during bolus administration of intravenous contrast. Multiplanar CT image reconstructions and MIPs were obtained to evaluate the vascular anatomy. Carotid stenosis measurements (when applicable) are obtained utilizing NASCET criteria, using the distal internal carotid diameter as the denominator. CONTRAST:  117mL OMNIPAQUE IOHEXOL 350 MG/ML SOLN COMPARISON:  CT head and MRI head 10/01/2020 FINDINGS: CTA NECK FINDINGS Aortic arch: Standard branching. Imaged portion shows no evidence of aneurysm or dissection. No significant stenosis of the major arch vessel origins. Right carotid system: Atherosclerotic calcification right carotid bulb without  significant stenosis. Left carotid system: Atherosclerotic calcification left carotid bulb without significant stenosis. Vertebral arteries: Both vertebral arteries patent to the basilar without stenosis. Skeleton: Cervical spondylosis.  No acute skeletal abnormality. Other neck: 10 mm left thyroid nodule. No further imaging necessary. No enlarged lymph nodes in the neck. Upper chest: Lung apices clear bilaterally. Review of the MIP images confirms the above findings CTA HEAD FINDINGS Anterior circulation: Mild atherosclerotic calcification in the cavernous carotid bilaterally without stenosis. Anterior and middle cerebral arteries patent bilaterally without significant stenosis or large vessel occlusion. Posterior circulation: Both vertebral arteries patent to the basilar. Left PICA patent. Right PICA not visualized. Basilar widely patent. AICA, superior cerebellar, and posterior cerebral arteries patent bilaterally without stenosis or large vessel occlusion. The Venous sinuses: Normal venous enhancement Anatomic variants: None Review of the MIP images confirms the above findings IMPRESSION: 1. No significant carotid or vertebral artery stenosis in the neck 2. Negative for intracranial large vessel occlusion or flow  limiting stenosis 3. Findings support cerebral emboli based on diffusion-weighted imaging. Electronically Signed   By: Franchot Gallo M.D.   On: 10/01/2020 11:02   CT HEAD WO CONTRAST  Result Date: 10/01/2020 CLINICAL DATA:  Headache and encephalopathy EXAM: CT HEAD WITHOUT CONTRAST TECHNIQUE: Contiguous axial images were obtained from the base of the skull through the vertex without intravenous contrast. COMPARISON:  None. FINDINGS: Brain: There is no mass, hemorrhage or extra-axial collection. There is generalized atrophy without lobar predilection. Hypodensity of the white matter is most commonly associated with chronic microvascular disease. Vascular: No abnormal hyperdensity of the major  intracranial arteries or dural venous sinuses. No intracranial atherosclerosis. Skull: The visualized skull base, calvarium and extracranial soft tissues are normal. Sinuses/Orbits: No fluid levels or advanced mucosal thickening of the visualized paranasal sinuses. No mastoid or middle ear effusion. The orbits are normal. Examination mildly degraded by motion IMPRESSION: Generalized atrophy and chronic microvascular ischemia without acute intracranial abnormality. Electronically Signed   By: Ulyses Jarred M.D.   On: 10/01/2020 02:06   CT ANGIO NECK W OR WO CONTRAST  Result Date: 10/01/2020 CLINICAL DATA:  Acute neuro deficit.  Stroke. EXAM: CT ANGIOGRAPHY HEAD AND NECK TECHNIQUE: Multidetector CT imaging of the head and neck was performed using the standard protocol during bolus administration of intravenous contrast. Multiplanar CT image reconstructions and MIPs were obtained to evaluate the vascular anatomy. Carotid stenosis measurements (when applicable) are obtained utilizing NASCET criteria, using the distal internal carotid diameter as the denominator. CONTRAST:  139mL OMNIPAQUE IOHEXOL 350 MG/ML SOLN COMPARISON:  CT head and MRI head 10/01/2020 FINDINGS: CTA NECK FINDINGS Aortic arch: Standard branching. Imaged portion shows no evidence of aneurysm or dissection. No significant stenosis of the major arch vessel origins. Right carotid system: Atherosclerotic calcification right carotid bulb without significant stenosis. Left carotid system: Atherosclerotic calcification left carotid bulb without significant stenosis. Vertebral arteries: Both vertebral arteries patent to the basilar without stenosis. Skeleton: Cervical spondylosis.  No acute skeletal abnormality. Other neck: 10 mm left thyroid nodule. No further imaging necessary. No enlarged lymph nodes in the neck. Upper chest: Lung apices clear bilaterally. Review of the MIP images confirms the above findings CTA HEAD FINDINGS Anterior circulation: Mild  atherosclerotic calcification in the cavernous carotid bilaterally without stenosis. Anterior and middle cerebral arteries patent bilaterally without significant stenosis or large vessel occlusion. Posterior circulation: Both vertebral arteries patent to the basilar. Left PICA patent. Right PICA not visualized. Basilar widely patent. AICA, superior cerebellar, and posterior cerebral arteries patent bilaterally without stenosis or large vessel occlusion. The Venous sinuses: Normal venous enhancement Anatomic variants: None Review of the MIP images confirms the above findings IMPRESSION: 1. No significant carotid or vertebral artery stenosis in the neck 2. Negative for intracranial large vessel occlusion or flow limiting stenosis 3. Findings support cerebral emboli based on diffusion-weighted imaging. Electronically Signed   By: Franchot Gallo M.D.   On: 10/01/2020 11:02   MR BRAIN WO CONTRAST  Addendum Date: 10/01/2020   ADDENDUM REPORT: 10/01/2020 07:48 ADDENDUM: These results will be called to the ordering clinician or representative by the Radiologist Assistant, and communication documented in the PACS or [employer]. Electronically Signed   By: Kellie Simmering DO   On: 10/01/2020 07:48   Result Date: 10/01/2020 CLINICAL DATA:  Neuro deficit, acute, stroke suspected. Additional history provided: Status post total knee replacement with sudden onset of intermittent confusion, headaches, vision changes, evaluate for possible CVA versus bleed. EXAM: MRI HEAD WITHOUT CONTRAST TECHNIQUE:  Multiplanar, multiecho pulse sequences of the brain and surrounding structures were obtained without intravenous contrast. COMPARISON:  Noncontrast head CT performed earlier the same day 10/01/2020. FINDINGS: Brain: Mild generalized cerebral atrophy. There are numerous small scattered acute cortical and subcortical infarcts within the bilateral cerebral hemispheres, affecting the frontal, parietal and occipital lobes. Moderate  multifocal T2/FLAIR hyperintensity within the cerebral white matter is nonspecific, but compatible with chronic small vessel ischemic disease. No evidence of intracranial mass. No chronic intracranial blood products. No extra-axial fluid collection. No midline shift. Vascular: Expected proximal arterial flow voids. Skull and upper cervical spine: No focal marrow lesion. Sinuses/Orbits: Visualized orbits show no acute finding. Mild ethmoid sinus mucosal thickening. IMPRESSION: Numerous small scattered acute cortical and subcortical infarcts within the bilateral frontal, parietal and occipital lobes. Findings are likely secondary to an embolic process. Mild cerebral atrophy and moderate chronic small vessel ischemic disease. Mild ethmoid sinus mucosal thickening. Electronically Signed: By: Kellie Simmering DO On: 10/01/2020 07:40        Scheduled Meds: .  stroke: mapping our early stages of recovery book   Does not apply Once  . aspirin EC  325 mg Oral BID  . brimonidine  1 drop Left Eye Daily   And  . timolol  1 drop Left Eye Daily  . dexamethasone (DECADRON) injection  10 mg Intravenous Once  . docusate sodium  100 mg Oral BID  . ipratropium  2 spray Each Nare Daily  . loratadine  10 mg Oral Daily  . metoprolol tartrate  50 mg Oral Once   Continuous Infusions: . sodium chloride 75 mL/hr at 10/01/20 0126  . methocarbamol (ROBAXIN) IV       LOS: 0 days    Time spent: 35 minutes    Barb Merino, MD [facility] Pager [phone]

## 2020-10-01 NOTE — Progress Notes (Signed)
Re-Evaluation  Patient Details Name: Justin Rose MRN: [id number] DOB: 1934-08-23 Today's Date: 10/01/2020    History of Present Illness 86 yo male s/p R TKA 11/215/21. Post op events/symptoms documented MRI(+) Numerous small scattered acute cortical and subcortical infarcts within the bilateral frontal, parietal and occipital lobes. Findings are likely secondary to an embolic process.    PT Comments    Post op events/symptoms documented in chart reviewed. Noted some R UE impaired proprioception/coordination during session this a.m. Gait not assessed yet this session-pt assisted over to recliner. Moderate R knee pain with activity. Will continue to follow and progress activity as tolerated. Recommendation has been updated to CIR.   Follow Up Recommendations  CIR     Equipment Recommendations   (TBD at next venue)    Recommendations for Other Services Rehab consult;OT consult     Precautions / Restrictions Precautions Precautions: Fall Required Braces or Orthoses: Knee Immobilizer - Right Knee Immobilizer - Right: Discontinue once straight leg raise with < 10 degree lag Restrictions Weight Bearing Restrictions: No Other Position/Activity Restrictions: WBAT    Mobility  Bed Mobility Overal bed mobility: Needs Assistance Bed Mobility: Supine to Sit     Supine to sit: Mod assist;+2 for physical assistance;+2 for safety/equipment;HOB elevated     General bed mobility comments: Increased time. Assist for trunk and bil LEs. Utilized bedpad to assist with scooting, positioning.  Transfers Overall transfer level: Needs assistance Equipment used: Rolling walker (2 wheeled) Transfers: Sit to/from [facility] Sit to Stand: Mod assist;+2 physical assistance;+2 safety/equipment;From elevated surface Stand pivot transfers: Min assist;+2 physical assistance;+2 safety/equipment       General transfer comment: Assist to power up, stabilize, control descent. VCs  safety, technique, hand/LE placement. Assist to position R LE prior to sitting. Stand pivot, bed to recliner with RW.  Ambulation/Gait             General Gait Details: NT-not quite ready.   Stairs             Wheelchair Mobility    Modified Rankin (Stroke Patients Only)       Balance Overall balance assessment: Needs assistance Sitting-balance support: Bilateral upper extremity supported;Feet supported Sitting balance-Leahy Scale: Fair Sitting balance - Comments: Fair static sitting balance. Poor anterior weighshifting   Standing balance support: Bilateral upper extremity supported Standing balance-Leahy Scale: Poor                              Cognition Arousal/Alertness: Awake/alert Behavior During Therapy: WFL for tasks assessed/performed Overall Cognitive Status: Within Functional Limits for tasks assessed                                        Exercises      General Comments        Pertinent Vitals/Pain Pain Assessment: Faces Faces Pain Scale: Hurts even more Pain Location: R knee Pain Descriptors / Indicators: Discomfort;Sore;Aching;Sharp Pain Intervention(s): Limited activity within patient's tolerance;Monitored during session;Repositioned;Ice applied    Home Living                      Prior Function            PT Goals (current goals can now be found in the care plan section) Progress towards PT goals: Progressing toward goals    Frequency  7X/week      PT Plan Current plan remains appropriate    Co-evaluation              AM-PAC PT "6 Clicks" Mobility   Outcome Measure  Help needed turning from your back to your side while in a flat bed without using bedrails?: A Lot Help needed moving from lying on your back to sitting on the side of a flat bed without using bedrails?: A Lot Help needed moving to and from a bed to a chair (including a wheelchair)?: A Lot Help needed standing  up from a chair using your arms (e.g., wheelchair or bedside chair)?: A Lot Help needed to walk in hospital room?: A Lot Help needed climbing 3-5 steps with a railing? : Total 6 Click Score: 11    End of Session Equipment Utilized During Treatment: Gait belt Activity Tolerance: Patient tolerated treatment well Patient left: in chair;with call bell/phone within reach;with nursing/sitter in room   PT Visit Diagnosis: Muscle weakness (generalized) (M62.81);Difficulty in walking, not elsewhere classified (R26.2);Pain;Other symptoms and signs involving the nervous system (R29.898) Pain - Right/Left: Right Pain - part of body: Knee     Time: 1105-1120 PT Time Calculation (min) (ACUTE ONLY): 15 min  Charges:                            Doreatha Massed, PT Acute Rehabilitation  Office: [phone] Pager: [phone]

## 2020-10-01 NOTE — Progress Notes (Signed)
Patient off unit to MRI.

## 2020-10-01 NOTE — Progress Notes (Signed)
PA Theresa Duty made aware of MRI results.

## 2020-10-01 NOTE — Consult Note (Signed)
Referring Physician: Dr. Wynelle Link    Chief Complaint: Bilateral tingling of fingers and left hand weakness  HPI: Justin Rose is an 86 y.o. male who initially presented to [facility] for right TKR. Following the procedure, the patient complained of headache, incoordination and visual floaters. Per RN note at the time of first evaluation: "Patient is alert to self,disoreinted to place, situation, time. Complaint of severe right knee pain, chest pain pain  and headache. Able to follow simple commands, raising of arm, grasp are equal but with decreased fine motor skills.  Facial movements are symmetrical. Patient having difficulty with complete sentences, word search, noted word salad, speech is garbled, with  incomprehensive words. Patient knows who his wife is [name], and the name of his children). VSS HR 87, B/p 143/79, SpO2 @ 99 % with [place]@ 2L." Exam by the Orthopaedics attending was nonfocal at that time. STAT CT showed no acute changes.   MRI brain was then obtained, revealing numerous small scattered acute cortical and subcortical infarcts within the bilateral frontal, parietal and occipital lobes. The findings were felt to be likely secondary to an embolic process. Mild cerebral atrophy and moderate chronic small vessel ischemic changes were also noted.  The patient states that his symptoms are now resolved. He has been started on ASA. He is not on an antiplatelet agent or an anticoagulant at home.   LSN: Prior to total knee replacement tPA Given: No: Status-post major operative procedure  Past Medical History:  Diagnosis Date  . Arthritis   . Basal cell carcinoma (BCC) of head   . Basal cell carcinoma, ear    Left ear  . Glaucoma    Left eye    Past Surgical History:  Procedure Laterality Date  . BACK SURGERY     2021  . BASAL CELL CARCINOMA EXCISION    . EYE SURGERY     08/2020 left eye  . HAND SURGERY    . PARATHYROIDECTOMY    . TONSILLECTOMY      History reviewed. No pertinent family  history. Social History:  reports that he has quit smoking. He has a 4.00 pack-year smoking history. He has never used smokeless tobacco. He reports that he does not drink alcohol and does not use drugs.  Allergies: No Known Allergies  Medications:  Scheduled: .  stroke: mapping our early stages of recovery book   Does not apply Once  . aspirin EC  325 mg Oral BID  . brimonidine  1 drop Left Eye Daily   And  . timolol  1 drop Left Eye Daily  . dexamethasone (DECADRON) injection  10 mg Intravenous Once  . docusate sodium  100 mg Oral BID  . ipratropium  2 spray Each Nare Daily  . loratadine  10 mg Oral Daily  . metoprolol tartrate  50 mg Oral Once   Continuous: . sodium chloride 75 mL/hr at 10/01/20 0126  . methocarbamol (ROBAXIN) IV      ROS: Has right knee and leg pain. Headache is now resolved. No CP, SOB, abdominal symptoms, dysphagia, dysarthria, aphasia, leg weakness or disorientation. Other symptoms as per HPI with comprehensive ROS otherwise negative.   Physical Examination: Blood pressure (!) 132/92, pulse 68, temperature 98.4 F (36.9 C), temperature source Oral, resp. rate 14, height 5\' 11"  (1.803 m), weight 85.5 kg, SpO2 100 %.  HEENT: [place]/AT Lungs: Respirations unlabored Ext: Brace to right lower extremity.   Neurologic Examination: Mental Status: Alert, oriented x 5, thought content appropriate.  Speech  fluent with mild difficulty comprehending complex commands. Naming and repetition intact.   Cranial Nerves: II:  Visual fields intact bilaterally. PERRL.  III,IV, VI: ptosis not present, EOMI but had to be prompted several times to initiate smooth pursuits V,VII: Smile symmetric, facial temp sensation equal bilaterally VIII: hearing intact to voice IX,X: No hypophonia XI: Symmetric XII: Midline tongue extension  Motor: RUE 5/5 proximally and distally LUE 4+/5 proximally and distally, except for 4/5 deltoid RLE unable to assess in detail s/p TKN. Able to wiggle  toes but this elicits pain LLE with assessment limited by pain in contralateral leg. Able to flex at hip, knee and ankle but did not give full effort.  Sensory: Temp and light touch intact to proximal upper and lower extremities without asymmetry. No extinction to DSS.  Deep Tendon Reflexes:  Bilateral brachioradialis 2+ Left patellar 2+ Unable to test right patellar Achilles 0 bilaterally Plantars: Equivocal Cerebellar: No ataxia with FNF bilaterally, but with slow, tentative movements. No tremor noted.  Gait: Unable to assess   Results for orders placed or performed during the hospital encounter of 09/30/20 (from the past 48 hour(s))  Glucose, capillary     Status: Abnormal   Collection Time: 10/01/20  1:12 AM  Result Value Ref Range   Glucose-Capillary 167 (H) 70 - 99 mg/dL    Comment: Glucose reference range applies only to samples taken after fasting for at least 8 hours.  CBC     Status: Abnormal   Collection Time: 10/01/20  1:13 AM  Result Value Ref Range   WBC 15.7 (H) 4.0 - 10.5 K/uL   RBC 3.71 (L) 4.22 - 5.81 MIL/uL   Hemoglobin 11.5 (L) 13.0 - 17.0 g/dL   HCT 35.1 (L) 39 - 52 %   MCV 94.6 80.0 - 100.0 fL   MCH 31.0 26.0 - 34.0 pg   MCHC 32.8 30.0 - 36.0 g/dL   RDW 12.9 11.5 - 15.5 %   Platelets 203 150 - 400 K/uL   nRBC 0.0 0.0 - 0.2 %    Comment: Performed at [facility], [place] [address], [place], [place] [address]  Basic metabolic panel     Status: Abnormal   Collection Time: 10/01/20  1:13 AM  Result Value Ref Range   Sodium 137 135 - 145 mmol/L   Potassium 4.1 3.5 - 5.1 mmol/L   Chloride 105 98 - 111 mmol/L   CO2 20 (L) 22 - 32 mmol/L   Glucose, Bld 173 (H) 70 - 99 mg/dL    Comment: Glucose reference range applies only to samples taken after fasting for at least 8 hours.   BUN 16 8 - 23 mg/dL   Creatinine, Ser 0.81 0.61 - 1.24 mg/dL   Calcium 9.0 8.9 - 10.3 mg/dL   GFR, Estimated >60 >60 mL/min    Comment: (NOTE) Calculated using the  CKD-EPI Creatinine Equation (2021)    Anion gap 12 5 - 15    Comment: Performed at [facility], [place] [address], [place], [place] [address]  Troponin I (High Sensitivity)     Status: None   Collection Time: 10/01/20  1:13 AM  Result Value Ref Range   Troponin I (High Sensitivity) 4 <18 ng/L    Comment: (NOTE) Elevated high sensitivity troponin I (hsTnI) values and significant  changes across serial measurements may suggest ACS but many other  chronic and acute conditions are known to elevate hsTnI results.  Refer to the "Links" section for  chest pain algorithms and additional  guidance. Performed at [facility], [place] [address] [place], [place] [address]   Hemoglobin A1c     Status: None   Collection Time: 10/01/20  1:13 AM  Result Value Ref Range   Hgb A1c MFr Bld 5.6 4.8 - 5.6 %    Comment: (NOTE) Pre diabetes:          5.7%-6.4%  Diabetes:              >6.4%  Glycemic control for   <7.0% adults with diabetes    Mean Plasma Glucose 114.02 mg/dL    Comment: Performed at [place] [address] [place], [place] [address]  Troponin I (High Sensitivity)     Status: None   Collection Time: 10/01/20  4:55 AM  Result Value Ref Range   Troponin I (High Sensitivity) 6 <18 ng/L    Comment: (NOTE) Elevated high sensitivity troponin I (hsTnI) values and significant  changes across serial measurements may suggest ACS but many other  chronic and acute conditions are known to elevate hsTnI results.  Refer to the "Links" section for chest pain algorithms and additional  guidance. Performed at [facility], [place] [address], [place], [place] [address]   Troponin I (High Sensitivity)     Status: None   Collection Time: 10/01/20  7:33 AM  Result Value Ref Range   Troponin I (High Sensitivity) 10 <18 ng/L    Comment: (NOTE) Elevated high sensitivity troponin I (hsTnI) values and significant  changes across serial  measurements may suggest ACS but many other  chronic and acute conditions are known to elevate hsTnI results.  Refer to the "Links" section for chest pain algorithms and additional  guidance. Performed at [facility], [place] [address], [place], [place] [address]   Lipid panel     Status: Abnormal   Collection Time: 10/01/20  7:33 AM  Result Value Ref Range   Cholesterol 115 0 - 200 mg/dL   Triglycerides 75 <150 mg/dL   HDL 26 (L) >40 mg/dL   Total CHOL/HDL Ratio 4.4 RATIO   VLDL 15 0 - 40 mg/dL   LDL Cholesterol 74 0 - 99 mg/dL    Comment:        Total Cholesterol/HDL:CHD Risk Coronary Heart Disease Risk Table                     Men   Women  1/2 Average Risk   3.4   3.3  Average Risk       5.0   4.4  2 X Average Risk   9.6   7.1  3 X Average Risk  23.4   11.0        Use the calculated Patient Ratio above and the CHD Risk Table to determine the patient's CHD Risk.        ATP III CLASSIFICATION (LDL):  <100     mg/dL   Optimal  100-129  mg/dL   Near or Above                    Optimal  130-159  mg/dL   Borderline  160-189  mg/dL   High  >190     mg/dL   Very High Performed at [place] [address] [place], [place] [address]   TSH     Status: None   Collection Time: 10/01/20  9:40 AM  Result Value Ref Range   TSH 0.397 0.350 - 4.500 uIU/mL    Comment: Performed by a 3rd Generation assay with a functional sensitivity of <=0.01 uIU/mL. Performed at [facility], [place] [address], [place], [place] [address]    CT ANGIO HEAD W OR WO CONTRAST  Result Date: 10/01/2020 CLINICAL DATA:  Acute neuro deficit.  Stroke. EXAM: CT ANGIOGRAPHY HEAD AND NECK TECHNIQUE: Multidetector CT imaging of the head and neck was performed using the standard protocol during bolus administration of intravenous contrast. Multiplanar CT image reconstructions and MIPs were obtained to evaluate the vascular anatomy. Carotid stenosis  measurements (when applicable) are obtained utilizing NASCET criteria, using the distal internal carotid diameter as the denominator. CONTRAST:  134mL OMNIPAQUE IOHEXOL 350 MG/ML SOLN COMPARISON:  CT head and MRI head 10/01/2020 FINDINGS: CTA NECK FINDINGS Aortic arch: Standard branching. Imaged portion shows no evidence of aneurysm or dissection. No significant stenosis of the major arch vessel origins. Right carotid system: Atherosclerotic calcification right carotid bulb without significant stenosis. Left carotid system: Atherosclerotic calcification left carotid bulb without significant stenosis. Vertebral arteries: Both vertebral arteries patent to the basilar without stenosis. Skeleton: Cervical spondylosis.  No acute skeletal abnormality. Other neck: 10 mm left thyroid nodule. No further imaging necessary. No enlarged lymph nodes in the neck. Upper chest: Lung apices clear bilaterally. Review of the MIP images confirms the above findings CTA HEAD FINDINGS Anterior circulation: Mild atherosclerotic calcification in the cavernous carotid bilaterally without stenosis. Anterior and middle cerebral arteries patent bilaterally without significant stenosis or large vessel occlusion. Posterior circulation: Both vertebral arteries patent to the basilar. Left PICA patent. Right PICA not visualized. Basilar widely patent. AICA, superior cerebellar, and posterior cerebral arteries patent bilaterally without stenosis or large vessel occlusion. The Venous sinuses: Normal venous enhancement Anatomic variants: None Review of the MIP images confirms the above findings IMPRESSION: 1. No significant carotid or vertebral artery stenosis in the neck 2. Negative for intracranial large vessel occlusion or flow limiting stenosis 3. Findings support cerebral emboli based on diffusion-weighted imaging. Electronically Signed   By: Franchot Gallo M.D.   On: 10/01/2020 11:02   CT HEAD WO CONTRAST  Result Date: 10/01/2020 CLINICAL  DATA:  Headache and encephalopathy EXAM: CT HEAD WITHOUT CONTRAST TECHNIQUE: Contiguous axial images were obtained from the base of the skull through the vertex without intravenous contrast. COMPARISON:  None. FINDINGS: Brain: There is no mass, hemorrhage or extra-axial collection. There is generalized atrophy without lobar predilection. Hypodensity of the white matter is most commonly associated with chronic microvascular disease. Vascular: No abnormal hyperdensity of the major intracranial arteries or dural venous sinuses. No intracranial atherosclerosis. Skull: The visualized skull base, calvarium and extracranial soft tissues are normal. Sinuses/Orbits: No fluid levels or advanced mucosal thickening of the visualized paranasal sinuses. No mastoid or middle ear effusion. The orbits are normal. Examination mildly degraded by motion IMPRESSION: Generalized atrophy and chronic microvascular ischemia without acute intracranial abnormality. Electronically Signed   By: Ulyses Jarred M.D.   On: 10/01/2020 02:06   CT ANGIO NECK W OR WO CONTRAST  Result Date: 10/01/2020 CLINICAL DATA:  Acute neuro deficit.  Stroke. EXAM: CT ANGIOGRAPHY HEAD AND NECK TECHNIQUE: Multidetector CT imaging of the head and neck was performed using the standard protocol during bolus administration of intravenous contrast. Multiplanar CT image reconstructions and MIPs were obtained to evaluate the vascular anatomy. Carotid stenosis measurements (when applicable) are obtained utilizing NASCET criteria, using the distal internal carotid diameter as the  denominator. CONTRAST:  182mL OMNIPAQUE IOHEXOL 350 MG/ML SOLN COMPARISON:  CT head and MRI head 10/01/2020 FINDINGS: CTA NECK FINDINGS Aortic arch: Standard branching. Imaged portion shows no evidence of aneurysm or dissection. No significant stenosis of the major arch vessel origins. Right carotid system: Atherosclerotic calcification right carotid bulb without significant stenosis. Left  carotid system: Atherosclerotic calcification left carotid bulb without significant stenosis. Vertebral arteries: Both vertebral arteries patent to the basilar without stenosis. Skeleton: Cervical spondylosis.  No acute skeletal abnormality. Other neck: 10 mm left thyroid nodule. No further imaging necessary. No enlarged lymph nodes in the neck. Upper chest: Lung apices clear bilaterally. Review of the MIP images confirms the above findings CTA HEAD FINDINGS Anterior circulation: Mild atherosclerotic calcification in the cavernous carotid bilaterally without stenosis. Anterior and middle cerebral arteries patent bilaterally without significant stenosis or large vessel occlusion. Posterior circulation: Both vertebral arteries patent to the basilar. Left PICA patent. Right PICA not visualized. Basilar widely patent. AICA, superior cerebellar, and posterior cerebral arteries patent bilaterally without stenosis or large vessel occlusion. The Venous sinuses: Normal venous enhancement Anatomic variants: None Review of the MIP images confirms the above findings IMPRESSION: 1. No significant carotid or vertebral artery stenosis in the neck 2. Negative for intracranial large vessel occlusion or flow limiting stenosis 3. Findings support cerebral emboli based on diffusion-weighted imaging. Electronically Signed   By: Franchot Gallo M.D.   On: 10/01/2020 11:02   MR BRAIN WO CONTRAST  Addendum Date: 10/01/2020   ADDENDUM REPORT: 10/01/2020 07:48 ADDENDUM: These results will be called to the ordering clinician or representative by the Radiologist Assistant, and communication documented in the PACS or [employer]. Electronically Signed   By: Kellie Simmering DO   On: 10/01/2020 07:48   Result Date: 10/01/2020 CLINICAL DATA:  Neuro deficit, acute, stroke suspected. Additional history provided: Status post total knee replacement with sudden onset of intermittent confusion, headaches, vision changes, evaluate for possible  CVA versus bleed. EXAM: MRI HEAD WITHOUT CONTRAST TECHNIQUE: Multiplanar, multiecho pulse sequences of the brain and surrounding structures were obtained without intravenous contrast. COMPARISON:  Noncontrast head CT performed earlier the same day 10/01/2020. FINDINGS: Brain: Mild generalized cerebral atrophy. There are numerous small scattered acute cortical and subcortical infarcts within the bilateral cerebral hemispheres, affecting the frontal, parietal and occipital lobes. Moderate multifocal T2/FLAIR hyperintensity within the cerebral white matter is nonspecific, but compatible with chronic small vessel ischemic disease. No evidence of intracranial mass. No chronic intracranial blood products. No extra-axial fluid collection. No midline shift. Vascular: Expected proximal arterial flow voids. Skull and upper cervical spine: No focal marrow lesion. Sinuses/Orbits: Visualized orbits show no acute finding. Mild ethmoid sinus mucosal thickening. IMPRESSION: Numerous small scattered acute cortical and subcortical infarcts within the bilateral frontal, parietal and occipital lobes. Findings are likely secondary to an embolic process. Mild cerebral atrophy and moderate chronic small vessel ischemic disease. Mild ethmoid sinus mucosal thickening. Electronically Signed: By: Kellie Simmering DO On: 10/01/2020 07:40   ECHOCARDIOGRAM COMPLETE  Result Date: 10/01/2020    ECHOCARDIOGRAM REPORT   Patient Name:   Keean Esselman  Date of Exam: 10/01/2020 Medical Rec #:  [id number]  Height:       71.0 in Accession #:    [id number] Weight:       188.6 lb Date of Birth:  October 26, 1934  BSA:          2.057 m Patient Age:    71 years   BP:  126/83 mmHg Patient Gender: M          HR:           70 bpm. Exam Location:  Inpatient Procedure: 2D Echo, Color Doppler and Cardiac Doppler Indications:    Stroke i163.9  History:        Patient has no prior history of Echocardiogram examinations.  Sonographer:    Raquel Sarna Senior RDCS Referring  Phys: [id number] [facility]  Sonographer Comments: Technically difficult study due to poor echo windows. Scanned supine; 1 day post TKA IMPRESSIONS  1. Left ventricular ejection fraction, by estimation, is 60 to 65%. The left ventricle has normal function. Left ventricular endocardial border not optimally defined to evaluate regional wall motion. Left ventricular diastolic parameters are consistent with Grade I diastolic dysfunction (impaired relaxation).  2. Right ventricular systolic function is mildly reduced. The right ventricular size is mildly enlarged. There is normal pulmonary artery systolic pressure. The estimated right ventricular systolic pressure is 13.0 mmHg.  3. The mitral valve is grossly normal. No evidence of mitral valve regurgitation. No evidence of mitral stenosis.  4. The aortic valve is abnormal. There is mild calcification of the aortic valve. Aortic valve regurgitation is not visualized. No aortic valve stenosis noted on this exam, however gradient is likely underestimated due to suboptimal Doppler alignment.  5. The inferior vena cava is dilated in size with >50% respiratory variability, suggesting right atrial pressure of 8 mmHg. Conclusion(s)/Recommendation(s): No intracardiac source of embolism detected on this transthoracic study. Image quality limits sensitivity to detect small lesions. FINDINGS  Left Ventricle: Left ventricular ejection fraction, by estimation, is 60 to 65%. The left ventricle has normal function. Left ventricular endocardial border not optimally defined to evaluate regional wall motion. The left ventricular internal cavity size was normal in size. There is no left ventricular hypertrophy. Left ventricular diastolic parameters are consistent with Grade I diastolic dysfunction (impaired relaxation). Right Ventricle: The right ventricular size is mildly enlarged. No increase in right ventricular wall thickness. Right ventricular systolic function is mildly reduced.  There is normal pulmonary artery systolic pressure. The tricuspid regurgitant velocity  is 2.31 m/s, and with an assumed right atrial pressure of 8 mmHg, the estimated right ventricular systolic pressure is 86.5 mmHg. Left Atrium: Left atrial size was normal in size. Right Atrium: Right atrial size was normal in size. Pericardium: There is no evidence of pericardial effusion. Mitral Valve: The mitral valve is grossly normal. No evidence of mitral valve regurgitation. No evidence of mitral valve stenosis. Tricuspid Valve: The tricuspid valve is normal in structure. Tricuspid valve regurgitation is not demonstrated. No evidence of tricuspid stenosis. Aortic Valve: The aortic valve is abnormal. There is moderate calcification of the aortic valve. Aortic valve regurgitation is not visualized. Pulmonic Valve: The pulmonic valve was not well visualized. Pulmonic valve regurgitation is not visualized. No evidence of pulmonic stenosis. Aorta: The aortic root is normal in size and structure. Venous: The inferior vena cava is dilated in size with greater than 50% respiratory variability, suggesting right atrial pressure of 8 mmHg. IAS/Shunts: No atrial level shunt detected by color flow Doppler.  LEFT VENTRICLE PLAX 2D LVIDd:         4.30 cm  Diastology LVIDs:         2.86 cm  LV e' medial:    8.16 cm/s LV PW:         0.85 cm  LV E/e' medial:  7.9 LV IVS:  0.92 cm  LV e' lateral:   10.80 cm/s LVOT diam:     2.20 cm  LV E/e' lateral: 6.0 LV SV:         73 LV SV Index:   35 LVOT Area:     3.80 cm  RIGHT VENTRICLE RV S prime:     8.92 cm/s TAPSE (M-mode): 1.9 cm LEFT ATRIUM           Index       RIGHT ATRIUM           Index LA diam:      3.10 cm 1.51 cm/m  RA Area:     15.00 cm LA Vol (A4C): 49.2 ml 23.92 ml/m RA Volume:   39.60 ml  19.25 ml/m  AORTIC VALVE LVOT Vmax:   90.90 cm/s LVOT Vmean:  73.900 cm/s LVOT VTI:    0.192 m  AORTA Ao Root diam: 3.60 cm MITRAL VALVE               TRICUSPID VALVE MV Area (PHT): 2.73  cm    TR Peak grad:   21.3 mmHg MV Decel Time: 278 msec    TR Vmax:        231.00 cm/s MV E velocity: 64.50 cm/s MV A velocity: 76.20 cm/s  SHUNTS MV E/A ratio:  0.85        Systemic VTI:  0.19 m                            Systemic Diam: 2.20 cm Cherlynn Kaiser MD Electronically signed by Cherlynn Kaiser MD Signature Date/Time: 10/01/2020/3:34:11 PM    Final     Assessment: 86 y.o. male with multifocal sub-centimeter acute ischemic infarctions on MRI obtained for bilateral hand weakness and numbness first noted after right TKN.  1. Neurological exam is nonfocal. 2. CTA of head and neck: No significant carotid or vertebral artery stenosis in the neck. Negative for intracranial large vessel occlusion or flow limiting stenosis.  3. MRI brain: Numerous small scattered acute cortical and subcortical infarcts within the bilateral frontal, parietal and occipital lobes. The findings are felt to be likely secondary to an embolic process. Mild cerebral atrophy and moderate chronic small vessel ischemic changes are also noted. 4. TTE: Left ventricular ejection fraction, by estimation, is 60 to 65%. The left ventricle has normal function. Left ventricular diastolic parameters are consistent with Grade I diastolic dysfunction (impaired relaxation).  Right ventricular systolic function is mildly reduced. The right ventricular size is mildly enlarged. There is normal pulmonary artery systolic pressure. No evidence of mitral stenosis. There is mild calcification of the aortic valve. Aortic valve regurgitation is not visualized. No aortic valve stenosis noted on this exam, however gradient is likely underestimated due to suboptimal Doppler alignment.  The inferior vena cava is dilated in size with >50% respiratory variability, suggesting right atrial pressure of 8 mmHg. No intracardiac source of embolism detected on this transthoracic study. Image quality limits sensitivity to detect small lesions. 5. Stroke Risk Factors -  None noted in his PMHx  6. DDx for the strokes includes embolization from cardiac source, but given negative TTE, this is now significantly less likely. Fat emboli are felt to be the more likely etiology. Per the literature, fat embolism is a relatively common complication after pelvic and long bone fracture, and is commonly seen after orthopaedic surgery, due to direct entry of fat globules into the systemic circulation.  Recommendations: 1. HgbA1c, fasting lipid panel 2. TEE. If TEE is negative, then the most likely etiology for the patient's microembolic perioperative strokes would be fat emboli given that the operation was a TKR.  3. PT consult, OT consult, Speech consult 4. Cardiac telemetry 5. Transferring to [facility].  6. Prophylactic therapy- Continue ASA 7. Frequent neuro checks 8. BP management. The strokes are most likely too small to have a significant penumbra and there is therefore no strong indication for permissive HTN.    @Electronically  signed: Dr. Kerney Elbe  10/01/2020, 3:50 PM

## 2020-10-01 NOTE — Progress Notes (Signed)
Paged Emerge.

## 2020-10-02 ENCOUNTER — Other Ambulatory Visit: Payer: Self-pay | Admitting: Cardiology

## 2020-10-02 ENCOUNTER — Encounter (HOSPITAL_COMMUNITY): Payer: Self-pay | Admitting: Orthopedic Surgery

## 2020-10-02 ENCOUNTER — Inpatient Hospital Stay (HOSPITAL_COMMUNITY): Payer: Medicare HMO

## 2020-10-02 DIAGNOSIS — I639 Cerebral infarction, unspecified: Secondary | ICD-10-CM | POA: Diagnosis not present

## 2020-10-02 DIAGNOSIS — I6389 Other cerebral infarction: Secondary | ICD-10-CM | POA: Diagnosis not present

## 2020-10-02 DIAGNOSIS — R279 Unspecified lack of coordination: Secondary | ICD-10-CM | POA: Diagnosis not present

## 2020-10-02 MED ORDER — ATORVASTATIN CALCIUM 10 MG PO TABS
20.0000 mg | ORAL_TABLET | Freq: Every day | ORAL | Status: DC
  Administered 2020-10-04 – 2020-10-06 (×3): 20 mg via ORAL
  Filled 2020-10-02 (×4): qty 2

## 2020-10-02 MED ORDER — ASPIRIN 81 MG PO CHEW
81.0000 mg | CHEWABLE_TABLET | Freq: Every day | ORAL | Status: DC
  Administered 2020-10-03 – 2020-10-09 (×7): 81 mg via ORAL
  Filled 2020-10-02 (×7): qty 1

## 2020-10-02 MED ORDER — CLOPIDOGREL BISULFATE 75 MG PO TABS
75.0000 mg | ORAL_TABLET | Freq: Every day | ORAL | Status: DC
  Administered 2020-10-03 – 2020-10-09 (×7): 75 mg via ORAL
  Filled 2020-10-02 (×7): qty 1

## 2020-10-02 NOTE — Progress Notes (Signed)
Inpatient Rehabilitation-Admissions Coordinator   CIR consult received. Met with patient and his wife bedside for IP Rehab assessment.. Feel pt is an appropriate candidate for CIR at this time. Reviewed program details, expected LOS, and anticipated outcomes. Pt's wife confirmed support at DC. AC will begin insurance auth request for possible CIR admission. Will update as soon as I have a determination.   Raechel Ache, OTR/L  Rehab Admissions Coordinator  [phone] 10/02/2020 11:18 AM

## 2020-10-02 NOTE — PMR Pre-admission (Signed)
PMR Admission Coordinator Pre-Admission Assessment  Patient: Justin Rose is an 86 y.o., male MRN: [id number] DOB: 11-Jan-1934 Height: 5' 11" (180.3 cm) Weight: 85.5 kg  Insurance Information HMO:     PPO:yes      PCP:      IPA:      80/20:      OTHER:  PRIMARY: [employer] Medicare      Policy#: [id number]      Subscriber: patient CM Name: Tammy      Phone#: [phone]    Fax#: [phone] Pre-Cert#: [id number]      Employer:  Josem Kaufmann provided by Lynelle Smoke with [employer] appeals for admit to CIR. Pt is approved from 11/23-11/27 with recommendation to send updates on 11/26 (as 27th is Saturday). Anticipate clinicals updates will go to (f): [phone]  Benefits:  Phone #: online at [email or web address] provider portal, transaction ID [id number]     Name:  Eff. Date: 11/16/2014-11/15/2020     Deduct: $0 (does not have deductible)      Out of Pocket Max: $7,000 ($1,187.80 met)      Life Max: NA CIR: $275/day co-pay with a max-co-pay of $1,500/admission (4 days)      SNF: $0/day co-pay for days 1-20, $184/day co-pay for days 21-100; limited to 100 days  Outpatient: $35/visit co-pay; limited by medical necessity   Home Health: 100% coverage, 0% co-insurance; limited by medical necessity      DME: 80% coverage, 20% co-insruance     Providers:  SECONDARY: None      Policy#:      Phone#:   Development worker, community:       Phone#:   The Therapist, art Information Summary" for patients in Inpatient Rehabilitation Facilities with attached "Privacy Act [place] Records" was provided and verbally reviewed with: Patient  Emergency Contact Information Contact Information    Name Relation Home Work Mobile   Schrupp,BETTY Spouse [phone]  [place] Daughter   [phone]      Current Medical History  Patient Admitting Diagnosis:  R TKA complicated by numerous small scattered acute cortical and subcortical infarcts within the bilateral frontal, parietal and occipital lobes following  operation.   History of Present Illness: Pt is an 86 yo male with diagnosis of right knee osteoarthritis.Pt was admitted to [facility] on 11/15 for scheduled right total knee arthroplasty with Dr. Wynelle Link. Post op, pt developed onset of progressive frontal HA, floaters in his vision and discoordination. 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 The findings were felt to be likely secondary to an embolic process. Pt evaluated by therapies with recommendation for CIR. Pt is to admit to CIR on 10/09/20  Complete NIHSS TOTAL: 0  Patient's medical record from [facility]  has been reviewed by the rehabilitation admission coordinator and physician.  Past Medical History  Past Medical History:  Diagnosis Date  . Arthritis   . Basal cell carcinoma (BCC) of head   . Basal cell carcinoma, ear    Left ear  . Glaucoma    Left eye    Family History   family history is not on file.  Prior Rehab/Hospitalizations Has the patient had prior rehab or hospitalizations prior to admission? No  Has the patient had major surgery during 100 days prior to admission? Yes   Current Medications  Current Facility-Administered Medications:  .  acetaminophen (TYLENOL) tablet 325-650 mg, 325-650 mg, Oral, Q6H PRN, Edmisten, Kristie L, PA .  aspirin chewable tablet 81 mg, 81 mg, Oral, Daily, Einar Pheasant, NP, 81 mg at 10/09/20 0815 .  bisacodyl (DULCOLAX) suppository 10 mg, 10 mg, Rectal, Daily PRN, Edmisten, Kristie L, PA .  brimonidine (ALPHAGAN) 0.2 % ophthalmic solution 1 drop, 1 drop, Left Eye, QHS, 1 drop at 10/08/20 2219 **AND** timolol (TIMOPTIC) 0.5 % ophthalmic solution 1 drop, 1 drop, Left Eye, QHS, Aluisio, Frank, MD .  clopidogrel (PLAVIX) tablet 75 mg, 75 mg, Oral, Daily, Heard, Courtney S, NP, 75 mg at 10/09/20 0815 .  diphenhydrAMINE (BENADRYL) 12.5 MG/5ML elixir 12.5-25 mg, 12.5-25 mg,  Oral, Q4H PRN, Edmisten, Kristie L, PA .  ipratropium (ATROVENT) 0.06 % nasal spray 2 spray, 2 spray, Each Nare, Daily, Edmisten, Kristie L, PA, 2 spray at 10/09/20 0817 .  lidocaine (XYLOCAINE) 2 % jelly 1 application, 1 application, Urethral, Once, Opyd, Ilene Qua, MD .  lip balm (CARMEX) ointment, , Topical, PRN, Gaynelle Arabian, MD, Given at 10/09/20 [phone] .  loratadine (CLARITIN) tablet 10 mg, 10 mg, Oral, Daily, Edmisten, Kristie L, PA, 10 mg at 10/09/20 0816 .  menthol-cetylpyridinium (CEPACOL) lozenge 3 mg, 1 lozenge, Oral, PRN **OR** phenol (CHLORASEPTIC) mouth spray 1 spray, 1 spray, Mouth/Throat, PRN, Edmisten, Kristie L, PA .  methocarbamol (ROBAXIN) tablet 500 mg, 500 mg, Oral, Q6H PRN, 500 mg at 10/09/20 0815 **OR** methocarbamol (ROBAXIN) 500 mg in dextrose 5 % 50 mL IVPB, 500 mg, Intravenous, Q6H PRN, Edmisten, Kristie L, PA .  metoCLOPramide (REGLAN) tablet 5-10 mg, 5-10 mg, Oral, Q8H PRN, 10 mg at 10/08/20 1006 **OR** metoCLOPramide (REGLAN) injection 5-10 mg, 5-10 mg, Intravenous, Q8H PRN, Edmisten, Kristie L, PA .  metoprolol tartrate (LOPRESSOR) tablet 50 mg, 50 mg, Oral, Once, Norins, Michael E, MD .  morphine 2 MG/ML injection 0.5-1 mg, 0.5-1 mg, Intravenous, Q2H PRN, Edmisten, Kristie L, PA, 1 mg at 10/03/20 2253 .  ondansetron (ZOFRAN) tablet 4 mg, 4 mg, Oral, Q6H PRN **OR** ondansetron (ZOFRAN) injection 4 mg, 4 mg, Intravenous, Q6H PRN, Edmisten, Kristie L, PA, 4 mg at 10/01/20 0320 .  oxyCODONE (Oxy IR/ROXICODONE) immediate release tablet 5-10 mg, 5-10 mg, Oral, Q4H PRN, Edmisten, Kristie L, PA, 10 mg at 10/09/20 0815 .  pantoprazole (PROTONIX) EC tablet 40 mg, 40 mg, Oral, Daily, Maurice March, PA-C, 40 mg at 10/09/20 0816 .  polyethylene glycol (MIRALAX / GLYCOLAX) packet 17 g, 17 g, Oral, Daily PRN, Edmisten, Kristie L, PA, 17 g at 10/06/20 0959 .  polyethylene glycol (MIRALAX / GLYCOLAX) packet 17 g, 17 g, Oral, BID, Paralee Cancel, MD, 17 g at 10/08/20 1006 .   psyllium (HYDROCIL/METAMUCIL) 1 packet, 1 packet, Oral, Daily, Dahal, Binaya, MD, 1 packet at 10/08/20 1006 .  senna-docusate (Senokot-S) tablet 1 tablet, 1 tablet, Oral, QHS, Dahal, Binaya, MD, 1 tablet at 10/07/20 2136 .  sodium phosphate (FLEET) 7-19 GM/118ML enema 1 enema, 1 enema, Rectal, Once PRN, Edmisten, Kristie L, PA .  tamsulosin (FLOMAX) capsule 0.4 mg, 0.4 mg, Oral, Daily, Dahal, Binaya, MD, 0.4 mg at 10/09/20 0815 .  traMADol (ULTRAM) tablet 50-100 mg, 50-100 mg, Oral, Q6H PRN, Edmisten, Kristie L, PA, 50 mg at 10/08/20 2218  Patients Current Diet:  Diet Order            Diet - low sodium heart healthy           Diet regular Room service appropriate? Yes; Fluid consistency: Thin  Diet effective now  Precautions / Restrictions Precautions Precautions: Fall Restrictions Weight Bearing Restrictions: No RLE Weight Bearing: Weight bearing as tolerated Other Position/Activity Restrictions: WBAT   Has the patient had 2 or more falls or a fall with injury in the past year? No  Prior Activity Level Community (5-7x/wk): [place], drove, Independent PTA  Prior Functional Level Self Care: Did the patient need help bathing, dressing, using the toilet or eating? Independent  Indoor Mobility: Did the patient need assistance with walking from room to room (with or without device)? Independent  Stairs: Did the patient need assistance with internal or external stairs (with or without device)? Independent  Functional Cognition: Did the patient need help planning regular tasks such as shopping or remembering to take medications? Independent  Home Assistive Devices / Equipment Home Assistive Devices/Equipment: (P) Eyeglasses, Wheelchair, Crutches, Environmental consultant (specify type), Hand-held shower hose, Blood pressure cuff Home Equipment: Walker - 2 wheels, Crutches, [place] - single point, Wheelchair - manual  Prior Device Use: Indicate devices/aids used by the patient  prior to current illness, exacerbation or injury? None of the above  Current Functional Level Cognition  Arousal/Alertness: Awake/alert Overall Cognitive Status: Impaired/Different from baseline Orientation Level: Oriented X4 Following Commands: Follows one step commands with increased time Safety/Judgement: Decreased awareness of safety, Decreased awareness of deficits General Comments: pt having difficulty recalling sequence of day. When asked if he is ready to get out of chair, he states, "you tell me if it's time". Encouraged to advocate for himself when asked and when he feels uncomfortable. RN had this comversation with him this morning as well in regard to his PRN meds Attention: Selective Selective Attention: Impaired Selective Attention Impairment: Verbal basic Memory: Impaired Memory Impairment: Storage deficit Problem Solving: Impaired Problem Solving Impairment: Verbal complex    Extremity Assessment (includes Sensation/Coordination)  Upper Extremity Assessment: Generalized weakness RUE Deficits / Details: pt able to hold cup and take medication from cup but when holding RW tends to have hands sliding foward toward front of RW RUE Sensation: WNL RUE Coordination: WNL LUE Deficits / Details: unable to release from RW on command with transitional transfer LUE Sensation: WNL LUE Coordination: decreased gross motor (pt able to complete finger<>nose but not as quickly as Rt UE)  Lower Extremity Assessment: Defer to PT evaluation RLE Deficits / Details: no extensor lag with SLR RLE Sensation: WNL RLE Coordination: WNL LLE Deficits / Details: WNL's LLE Sensation: WNL LLE Coordination: WNL    ADLs  Overall ADL's : Needs assistance/impaired Eating/Feeding: Set up, Sitting Eating/Feeding Details (indicate cue type and reason): pt insistign wife place egg salad sandwich on window for later even with education that we can get him a fresh one for dinner. pt with poor intake of  lunch Grooming: Oral care, Standing, Moderate assistance (eva walker) Grooming Details (indicate cue type and reason): OT helping hand pt all needed items and one step commands to complete oral task. OT holding basin for spitting Lower Body Dressing: Total assistance Toilet Transfer: Moderate assistance Toilet Transfer Details (indicate cue type and reason): simulated from chair Functional mobility during ADLs: Moderate assistance (eva walker) General ADL Comments: pt progressed with mod cues with eva walker to sink surface and back.     Mobility  Overal bed mobility: Needs Assistance Bed Mobility: Sit to Supine Supine to sit: Min assist Sit to supine: Mod assist General bed mobility comments: mod A for RLE and positioning in CPM    Transfers  Overall transfer level: Needs assistance Equipment used: Rolling walker (  2 wheeled) Transfers: Sit to/from Stand Sit to Stand: Mod assist Stand pivot transfers: Mod assist General transfer comment: needed mod A for power up and fwd wt shift from recliner. Had difficulty stepping feet to bed with use of RW.     Ambulation / Gait / Stairs / Wheelchair Mobility  Ambulation/Gait Ambulation/Gait assistance: Min assist, Mod assist Gait Distance (Feet): 6 Feet Assistive device: Rolling walker (2 wheeled) Gait Pattern/deviations: Step-to pattern, Decreased weight shift to right, Trunk flexed General Gait Details: pt became light headed at 46' and needed to return to sitting, vc's for posture Gait velocity: decreased Gait velocity interpretation: <1.31 ft/sec, indicative of household ambulator    Posture / Balance Dynamic Sitting Balance Sitting balance - Comments: fair with fatigue Balance Overall balance assessment: Needs assistance Sitting-balance support: Feet supported, No upper extremity supported Sitting balance-Leahy Scale: Fair Sitting balance - Comments: fair with fatigue Postural control: Posterior lean Standing balance support:  Bilateral upper extremity supported, During functional activity Standing balance-Leahy Scale: Fair Standing balance comment: reliant on UE support. Worked on erect posture and abdominal activation in standing. Pt has been having neck discomfort, noted forward head posture    Special needs/care consideration Skin: surgical incision to RLE (TKA).  CPM orders in acute chart.    Previous Home Environment (from acute therapy documentation) Living Arrangements: Spouse/significant other Available Help at Discharge: Family Type of Home: House Home Layout: One level Home Access: Stairs to enter Entrance Stairs-Rails: Left Entrance Stairs-Number of Steps: 3 Bathroom Shower/Tub: Multimedia programmer: Handicapped height Bathroom Accessibility: Yes [place]: (P) No Additional Comments: wife named Environmental education officer  Discharge Living Setting Plans for Discharge Living Setting: House, Lives with (comment) (wife) Type of Home at Discharge: House Discharge Home Layout: One level Discharge Home Access: Stairs to enter Entrance Stairs-Rails: Left (grab bar) Entrance Stairs-Number of Steps: 3 steps then 1 step Discharge Bathroom Shower/Tub: Tub/shower unit, Walk-in shower Discharge Bathroom Toilet: Handicapped height Discharge Bathroom Accessibility: Yes How Accessible: Accessible via walker Does the patient have any problems obtaining your medications?: No  Social/Family/Support Systems Patient Roles: Spouse, Other (Comment) Licensed conveyancer) Contact Information: spouse: [name] [phone] Anticipated Caregiver: betty  Anticipated Caregiver's Contact Information: see above Ability/Limitations of Caregiver: Min A Caregiver Availability: 24/7 Discharge Plan Discussed with Primary Caregiver: Yes (pt and Betty) Is Caregiver In Agreement with Plan?: Yes Does Caregiver/Family have Issues with Lodging/Transportation while Pt is in Rehab?: No  Goals Patient/Family Goal for Rehab: PT/OT:  Supervision; SLP: NA Expected length of stay: 6-9 days Pt/Family Agrees to Admission and willing to participate: Yes Program Orientation Provided & Reviewed with Pt/Caregiver Including Roles  & Responsibilities: Yes (pt and his wife)  Barriers to Discharge: Home environment access/layout  Barriers to Discharge Comments: steps to enter home  Decrease burden of Care through IP rehab admission: OtherNA  Possible need for SNF placement upon discharge: Not anticipated. Pt and family aware of plan for DC home after CIR stay. Anticipate Min A goals; family able to support Min A level at DC.   Patient Condition: I have reviewed medical records from [facility], spoken with RN, [facility] team, and patient and spouse. I met with patient at the bedside for inpatient rehabilitation assessment.  Patient will benefit from ongoing PT and OT, can actively participate in 3 hours of therapy a day 5 days of the week, and can make measurable gains during the admission.  Patient will also benefit from the coordinated team approach during an Inpatient Acute  Rehabilitation admission.  The patient will receive intensive therapy as well as Rehabilitation physician, nursing, social worker, and care management interventions.  Due to safety, skin/wound care, disease management, medication administration, pain management and patient education the patient requires 24 hour a day rehabilitation nursing.  The patient is currently Mod A +2 for tranfers with mobility and Min/Mod A 6 feet for set up A to Mod A for basic ADLs.  Discharge setting and therapy post discharge at home with home health is anticipated.  Patient has agreed to participate in the Acute Inpatient Rehabilitation Program and will admit 10/09/20.  Preadmission Screen Completed By:  Raechel Ache, 10/09/2020 12:17 PM ______________________________________________________________________   Discussed status with Dr. Posey Pronto  on 10/09/20 at 12:24PM and received  approval for admission today.  Admission Coordinator:  Raechel Ache, OT, time 12:24PM/Date 10/09/20   Assessment/Plan:  Diagnosis: Right TKA  1. Does the need for close, 24 hr/day Medical supervision in concert with the patient's rehab needs make it unreasonable for this patient to be served in a less intensive setting? Yes 2. Co-Morbidities requiring supervision/potential complications: multifocal infarcts, transaminitis (repeat labs, avoid hepatotoxic meds), ABLA (repeat labs, consider transfusion if necessary to ensure appropriate perfusion for increased activity tolerance), post-op pain (Biofeedback training with therapies to help reduce reliance on opiate pain medications, monitor pain control during therapies, and sedation at rest and titrate to maximum efficacy to ensure participation and gains in therapies) 3. Due to bowel management, safety, skin/wound care, disease management, pain management and patient education, does the patient require 24 hr/day rehab nursing? Yes 4. Does the patient require coordinated care of a physician, rehab nurse, PT, OT to address physical and functional deficits in the context of the above medical diagnosis(es)? Yes Addressing deficits in the following areas: balance, endurance, locomotion, strength, transferring, bathing, dressing, toileting and psychosocial support 5. Can the patient actively participate in an intensive therapy program of at least 3 hrs of therapy 5 days a week? Yes 6. The potential for patient to make measurable gains while on inpatient rehab is excellent 7. Anticipated functional outcomes upon discharge from inpatient rehab: supervision PT, supervision OT, n/a SLP 8. Estimated rehab length of stay to reach the above functional goals is: 12-16 days. 9. Anticipated discharge destination: Home 10. Overall Rehab/Functional Prognosis: excellent   MD Signature: Delice Lesch, MD, ABPMR

## 2020-10-02 NOTE — Progress Notes (Signed)
Lower extremity venous has been completed.   Preliminary results in CV Proc.   Abram Sander 10/02/2020 10:56 AM

## 2020-10-02 NOTE — Progress Notes (Signed)
   Subjective: 2 Days Post-Op Procedure(s) (LRB): TOTAL KNEE ARTHROPLASTY (Right) Patient reports pain as mild.   Patient seen in rounds for Dr. Wynelle Link. Patient is feeling well this AM, seems more like himself. Denies headache, chest pain, SOB, or calf pain. Knee looks great, reports his pain is well controlled with the PO oxycodone.  Plan is to go to inpatient rehab after hospital stay.  Objective: Vital signs in last 24 hours: Temp:  [97.9 F (36.6 C)-98.6 F (37 C)] 97.9 F (36.6 C) (11/17 0809) Pulse Rate:  [68-89] 89 (11/17 0809) Resp:  [14-18] 18 (11/17 0809) BP: (115-144)/(82-92) 115/82 (11/17 0809) SpO2:  [97 %-100 %] 100 % (11/17 0809)  Intake/Output from previous day:  Intake/Output Summary (Last 24 hours) at 10/02/2020 0814 Last data filed at 10/02/2020 0318 Gross per 24 hour  Intake 240 ml  Output 2050 ml  Net -1810 ml    Intake/Output this shift: No intake/output data recorded.  Labs: Recent Labs    10/01/20 0113  HGB 11.5*   Recent Labs    10/01/20 0113  WBC 15.7*  RBC 3.71*  HCT 35.1*  PLT 203   Recent Labs    10/01/20 0113  NA 137  K 4.1  CL 105  CO2 20*  BUN 16  CREATININE 0.81  GLUCOSE 173*  CALCIUM 9.0   No results for input(s): LABPT, INR in the last 72 hours.  Exam: General - Patient is Alert and Oriented Extremity - Neurologically intact Neurovascular intact Sensation intact distally Dorsiflexion/Plantar flexion intact Dressing/Incision - Clean, dry, intact. Aquacell with minimal bloody drainage. Motor Function - intact, moving foot and toes well on exam.   Past Medical History:  Diagnosis Date  . Arthritis   . Basal cell carcinoma (BCC) of head   . Basal cell carcinoma, ear    Left ear  . Glaucoma    Left eye    Assessment/Plan: 2 Days Post-Op Procedure(s) (LRB): TOTAL KNEE ARTHROPLASTY (Right) Principal Problem:   Arterial ischemic stroke, multifocal, multiple vascular territories, acute (HCC) Active  Problems:   OA (osteoarthritis) of knee   Primary osteoarthritis of right knee   Discoordination   Primary osteoarthritis of knee  Estimated body mass index is 26.3 kg/m as calculated from the following:   Height as of this encounter: 5\' 11"  (1.803 m).   Weight as of this encounter: 85.5 kg. Up with therapy  DVT Prophylaxis - Aspirin Weight-bearing as tolerated  Knee looks great today, discussed exercises he can work on in bed to work on range of motion. Will continue PT. Social work is working on Armed forces operational officer for [employer]  Theresa Duty, PA-C Orthopedic Surgery [phone] 10/02/2020, 8:14 AM

## 2020-10-02 NOTE — Evaluation (Signed)
Occupational Therapy Evaluation Patient Details Name: Justin Rose MRN: [id number] DOB: 09/02/1934 Today's Date: 10/02/2020    History of Present Illness 86 yo male s/p R TKA 11/215/21. Post op events/symptoms documented MRI(+) Numerous small scattered acute cortical and subcortical infarcts within the bilateral frontal, parietal and occipital lobes. Findings are likely secondary to an embolic process.   Clinical Impression   Patient is s/p R TKA  surgery with scans revealing CVA resulting in functional limitations due to the deficits listed below (see OT problem list). Pt currently requires total +2 max (A) for basic transfers affecting all adls. Pt reports inability to void bladder properly despite feeling the need to void. RN made aware. Pt voiding less than 75cc static standing with urinal this session.  Patient will benefit from skilled OT acutely to increase independence and safety with ADLS to allow discharge CIR.     Follow Up Recommendations  CIR    Equipment Recommendations  3 in 1 bedside commode    Recommendations for Other Services Rehab consult     Precautions / Restrictions Precautions Precautions: Fall Required Braces or Orthoses: Knee Immobilizer - Right Knee Immobilizer - Right: Discontinue once straight leg raise with < 10 degree lag Restrictions Weight Bearing Restrictions: No Other Position/Activity Restrictions: WBAT      Mobility Bed Mobility Overal bed mobility: Needs Assistance Bed Mobility: Supine to Sit     Supine to sit: +2 for physical assistance;Mod assist;HOB elevated     General bed mobility comments: pt requires total (A) of R LE toward EOB. pt able to adduct LLE toward Right side pt able to reach with L UE for bed rail but requires (A) to rotate onto R elbow and begin to push up into static sitting. pt initially requires (A) for static sitting. pt demonstrates L lateral lean    Transfers Overall transfer level: Needs assistance Equipment  used: Rolling walker (2 wheeled) Transfers: Sit to/from Stand Sit to Stand: Mod assist;+2 physical assistance;+2 safety/equipment;From elevated surface Stand pivot transfers: Min assist;+2 physical assistance;+2 safety/equipment       General transfer comment: pt able to power up into standing with cues for weight shifting toward R and trunk/ neck extension for elongated posture. pt with a very flexed posture and looking at feet. recommend next session possible need to use eva walker to help with trunk elongation.     Balance Overall balance assessment: Needs assistance Sitting-balance support: Bilateral upper extremity supported;Feet supported Sitting balance-Leahy Scale: Fair   Postural control: Left lateral lean Standing balance support: Bilateral upper extremity supported;During functional activity Standing balance-Leahy Scale: Poor Standing balance comment: reliant on staff for safety                           ADL either performed or assessed with clinical judgement   ADL Overall ADL's : Needs assistance/impaired Eating/Feeding: Modified independent   Grooming: Wash/dry hands;Wash/dry face;Set up;Bed level               Lower Body Dressing: Total assistance   Toilet Transfer: +2 for physical assistance;Maximal assistance           Functional mobility during ADLs: +2 for physical assistance;Maximal assistance General ADL Comments: pt requires cues throughout session for safety and hand placement     Vision Baseline Vision/History: Wears glasses Wears Glasses: At all times Patient Visual Report: No change from baseline       Perception     Praxis  Pertinent Vitals/Pain Pain Assessment: Faces Faces Pain Scale: Hurts whole lot Pain Location: R knee Pain Descriptors / Indicators: Discomfort;Sore;Aching;Sharp;Dull;Grimacing;Guarding;Operative site guarding Pain Intervention(s): Monitored during session;Repositioned;Patient requesting pain  meds-RN notified;RN gave pain meds during session;Ice applied     Hand Dominance Right   Extremity/Trunk Assessment Upper Extremity Assessment Upper Extremity Assessment: Generalized weakness RUE Deficits / Details: pt able to hold cup and take medication from cup but when holding RW tends to have hands sliding foward toward front of RW LUE Deficits / Details: unable to release from RW on command with transitional transfer   Lower Extremity Assessment Lower Extremity Assessment: Defer to PT evaluation   Cervical / Trunk Assessment Cervical / Trunk Assessment: Kyphotic   Communication Communication Communication: HOH   Cognition Arousal/Alertness: Awake/alert Behavior During Therapy: WFL for tasks assessed/performed Overall Cognitive Status: Within Functional Limits for tasks assessed                                     General Comments  dressing dry and intact    Exercises     Shoulder Instructions      Home Living Family/patient expects to be discharged to:: Private residence Living Arrangements: Spouse/significant other Available Help at Discharge: Family Type of Home: House Home Access: Stairs to enter Technical brewer of Steps: 3 Entrance Stairs-Rails: Left Home Layout: One level     Bathroom Shower/Tub: Occupational psychologist: Handicapped height Bathroom Accessibility: Yes   Home Equipment: Environmental consultant - 2 wheels;Crutches;Cane - single point;Wheelchair - manual   Additional Comments: wife named Justin Rose      Prior Functioning/Environment Level of Independence: Independent        Comments: reports enjoys playing golf and does all the yard work        OT Problem List: Decreased activity tolerance;Impaired balance (sitting and/or standing);Decreased safety awareness;Decreased knowledge of use of DME or AE;Decreased knowledge of precautions;Pain      OT Treatment/Interventions: Self-care/ADL training;Therapeutic  exercise;Neuromuscular education;DME and/or AE instruction;Therapeutic activities;Cognitive remediation/compensation;Patient/family education;Balance training;Manual therapy;Modalities;Energy conservation    OT Goals(Current goals can be found in the care plan section) Acute Rehab OT Goals Patient Stated Goal: to be able to play golf again OT Goal Formulation: With patient/family Time For Goal Achievement: 10/16/20 Potential to Achieve Goals: Good  OT Frequency: Min 2X/week   Barriers to D/C:            Co-evaluation PT/OT/SLP Co-Evaluation/Treatment: Yes Reason for Co-Treatment: For patient/therapist safety;To address functional/ADL transfers   OT goals addressed during session: Proper use of Adaptive equipment and DME;ADL's and self-care      AM-PAC OT "6 Clicks" Daily Activity     Outcome Measure Help from another person eating meals?: A Little Help from another person taking care of personal grooming?: A Little Help from another person toileting, which includes using toliet, bedpan, or urinal?: A Lot Help from another person bathing (including washing, rinsing, drying)?: A Lot Help from another person to put on and taking off regular upper body clothing?: A Little Help from another person to put on and taking off regular lower body clothing?: A Lot 6 Click Score: 15   End of Session Equipment Utilized During Treatment: Rolling walker;Right knee immobilizer;Gait belt CPM Right Knee CPM Right Knee: Off Nurse Communication: Mobility status;Precautions;Weight bearing status  Activity Tolerance: Patient tolerated treatment well Patient left: in chair;with call bell/phone within reach;with chair alarm set  OT  Visit Diagnosis: Unsteadiness on feet (R26.81);Muscle weakness (generalized) (M62.81);Pain Pain - Right/Left: Right Pain - part of body: Knee                Time: 1435-1454 OT Time Calculation (min): 19 min Charges:  OT General Charges $OT Visit: 1 Visit OT  Evaluation $OT Eval Moderate Complexity: 1 Mod   Brynn, OTR/L  Acute Rehabilitation Services Pager: [phone] Office: [phone] .   Jeri Modena 10/02/2020, 3:09 PM

## 2020-10-02 NOTE — Progress Notes (Addendum)
STROKE TEAM PROGRESS NOTE    Interval History   Overnight patient was transferred to [facility] for stroke work up. He states he feels fine this morning and states that his neuro deficits have resolved. Wife entered the room and both she and patient were given a medical update.   Pertinent Lab Work and Imaging    CT Head WO IV Contrast 10/01/20 Generalized atrophy and chronic microvascular ischemia without acute intracranial abnormality.  CT Angio Head and Neck W WO IV Contrast 10/01/20 1. No significant carotid or vertebral artery stenosis in the neck 2. Negative for intracranial large vessel occlusion or flow limiting stenosis 3. Findings support cerebral emboli based on diffusion-weighted Imaging.  MRI Brain WO IV Contrast 10/01/20 Numerous small scattered acute cortical and subcortical infarcts within the bilateral frontal, parietal and occipital lobes. Findings are likely secondary to an embolic process. Mild cerebral atrophy and moderate chronic small vessel ischemic disease. Mild ethmoid sinus mucosal thickening.  Echocardiogram Complete 10/01/20 Left ventricular ejection fraction, by estimation, is 60 to 65%. The left ventricle has normal function. Left ventricular diastolic parameters are consistent with Grade I diastolic dysfunction (impaired relaxation).  Right ventricular systolic function is mildly reduced. The right ventricular size is mildly enlarged. There is normal pulmonary artery systolic pressure. No evidence of mitral stenosis. There is mild calcification of the aortic valve. Aortic valve regurgitation is not visualized. No aortic valve stenosis noted on this exam, however gradient is likely underestimated due to suboptimal Doppler alignment.  The inferior vena cava is dilated in size with >50% respiratory variability, suggesting right atrial pressure of 8 mmHg. No intracardiac source of embolism detected on this transthoracic study. Image quality limits sensitivity to  detect small lesions.  Physical Examination   Constitutional: Calm, appropriate for condition  Cardiovascular: Normal RR Respiratory: No increased WOB   Mental status: AAOx4 Speech: Fluent with repetition and naming intact, no dysarthria  Cranial nerves: EOMI, VFF, Face symmetric, Shoulder shrug intact, Tongue midline  Motor: Normal bulk and tone. No drift. 5/5 BUE. RLE did not do full strength testing due to recent TKR.  Had him bend the right knee slightly which he was able to do and wiggled the toes on his right foot. LLE +4/5 strength- pain in right lower extremity when moving the left limited movement in the LLE  Sensory: Intact to light tough throughout  Coordination: FNF intact, HTS deferred due to pain from TKR  Reflexes: Not assessed Gait: Deferred   NIHSS: 0   Assessment and Plan   Mr. Justin Rose is a 86 y.o. 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 MRI Brain obtained post procedure due to neurological sx showed numerous small scattered acute cortical and subcortical infarcts within the bilateral frontal, parietal and occipital lobes. He was started on ASA and subsequently transferred to [facility] for stroke work up.    #Embolic Appearing Stroke  Patient presented with the symptoms described above. At this time, stroke work up is complete. His TTE revealed EF 60 to 65 %, normal LV function, Left atrium normal in size, no atrial level shunt and no intracardiac source identified(view full report above). MRI w/numerous small scattered acute cortical and subcortical infarcts within the bilateral frontal, parietal and occipital lobes. CTA Head and Neck showed no significant carotid or vertebral  artery stenosis in the neck and was negative for flow limiting stenosis intracranially. Stroke labs  were completed including Lipid panel w/LDL 74 and Hemoglobin A1C 5.6. He has an embolic stroke of undetermined origin. Differential etiologies include atrial fibrillation. Also possible that his strokes are in the setting of hypoperfusion due to soft blood pressures during his procedure in the 90-100 range per chart review. He was started on Aspirin this admission for stroke prevention. Given small stroke size and low NIHSS he qualifies for DAPT. He will discharge with a 30 day cardiac event monitor to evaluate for atrial fibrillation.  - DAPT for 21 days followed by Aspirin monotherapy. Will initiate this tomorrow on 10/03/20 given he is currently on ASA 325 mg BID. Will start ASA 81 mg + Plavix 75 mg tomorrow. Have discussed this with ortho and they are ok with this plan.  - Continue Atorvastatin 20 mg for secondary stroke prevention - Patient amenable to 30 day cardiac event monitor, Cards Master contacted and this will be delivered to his house. He was educated about an ILR but he did not want to have this placed.  -At discharge please place ambulatory referral to neurology for stroke follow up   #Blood Pressure Goal  Given acute stroke, acute SBP goal is < 180 and it is recommended to slowly bring blood pressure down to normal limits without inducing any acute drops. Currently his pressure are trending normotensive in the 110-130 range, at goal.   #Hyperlipidemia From a stroke prevention stand point, the LDL goal is < 70. His LDL is at 74, close to goal and he is to continue on Atorvastatin 20 mg that was initiated for secondary stroke prevention purposes   Neurology will sign off given neurological work up is complete. Please contact with any questions.   Hospital day # 1  Ruta Hinds, NP  [facility] Nurse Practitioner Patient seen and discussed with attending physician Dr. Erlinda Hong   ATTENDING NOTE: I reviewed above note and agree with the assessment and plan. Pt was seen and  examined.   Wife at bedside.  Patient lying in bed, awake alert, orientated, stated that he had right TKR surgery 7:30 AM yesterday and his symptoms started at 1 PM yesterday.  Currently his symptoms are resolved.  MRI showed bilateral embolic stroke, however, also more watershed distribution.  CTA head and neck unremarkable.  EF 60 to 65%.  LE venous Doppler negative for DVT.  LDL 74, A1c 5.6.  Creatinine 0.81.  Neuro examination unremarkable.  Etiology for patient symptoms uncertain.  Paradoxical emboli less likely given no DVT.  He did have hypotension during the surgery with anesthesia, and MRI strokes bilaterally more watershed distribution, however CT head negative no significance and vascular stenosis. Occult A. fib cannot be ruled out.  Discussed with patient about loop recorder, he declined, but willing to do so today CardioNet monitor as outpatient.  Will recommend DAPT if okay with orthopedic service and continue Lipitor 20.    Neurology will sign off. Please call with questions. Pt will follow up with stroke clinic NP at [facility] in about 4 weeks. Thanks for the consult.   Rosalin Hawking, MD PhD Stroke Neurology 10/02/2020 5:53 PM     To contact Stroke Continuity provider, please refer to [email or web address] After hours, contact General Neurology

## 2020-10-02 NOTE — Progress Notes (Signed)
Physical Therapy Treatment Patient Details Name: Justin Rose MRN: [id number] DOB: 1934-01-11 Today's Date: 10/02/2020    History of Present Illness Pt is 86 yo male s/p R TKA 11/215/21. Post op events/symptoms documented MRI(+) Numerous small scattered acute cortical and subcortical infarcts within the bilateral frontal, parietal and occipital lobes. Findings are likely secondary to an embolic process.    PT Comments    Pt with increased R LE pain and significant edema that limited.  Limited quad activation and ROM on the R knee.  Pt requiring repeated and increased cues for sequencing with transfers and gait - question if distracted by pain vs changes with stroke.  He did ambulate 3'x2 today with RW.  Continue to advance as able.     Follow Up Recommendations  CIR     Equipment Recommendations  Rolling walker with 5" wheels;3in1 (PT);Other (comment) (further assessment next venue)    Recommendations for Other Services       Precautions / Restrictions Precautions Precautions: Fall Required Braces or Orthoses: Knee Immobilizer - Right Knee Immobilizer - Right: Discontinue once straight leg raise with < 10 degree lag Restrictions Weight Bearing Restrictions: No Other Position/Activity Restrictions: WBAT    Mobility  Bed Mobility Overal bed mobility: Needs Assistance Bed Mobility: Supine to Sit     Supine to sit: +2 for physical assistance;Mod assist;HOB elevated     General bed mobility comments: pt requires total (A) of R LE toward EOB. pt able to adduct LLE toward Right side pt able to reach with L UE for bed rail but requires (A) to rotate onto R elbow and begin to push up into static sitting  Transfers Overall transfer level: Needs assistance Equipment used: Rolling walker (2 wheeled) Transfers: Sit to/from Stand Sit to Stand: Mod assist;+2 physical assistance;+2 safety/equipment;From elevated surface Stand pivot transfers: Min assist;+2 physical assistance;+2  safety/equipment       General transfer comment: Sit to stand x 2; pt able to power up into standing with cues for weight shifting toward R and trunk/ neck extension for elongated posture. pt with a very flexed posture and looking at feet.; cues and assist to extend R LE with sitting; cues for safe hand placement for standing and cues to reach back to sit -pt continually had difficulty releasing walker to sit  Ambulation/Gait Ambulation/Gait assistance: Mod assist;+2 physical assistance Gait Distance (Feet): 3 Feet (3'x2) Assistive device: Rolling walker (2 wheeled) Gait Pattern/deviations: Trunk flexed;Decreased stance time - right;Shuffle Gait velocity: decreased   General Gait Details: Pt required repeated cues for standing upright/straightening trunk, cues for sequencing and pushing through arms, adjusted walker to lower than expected height due to trunk flexed and long arm length   Stairs             Wheelchair Mobility    Modified Rankin (Stroke Patients Only) Modified Rankin (Stroke Patients Only) Pre-Morbid Rankin Score: No symptoms Modified Rankin: Moderately severe disability     Balance Overall balance assessment: Needs assistance Sitting-balance support: Bilateral upper extremity supported;Feet supported Sitting balance-Leahy Scale: Poor Sitting balance - Comments: Pt leaning to the left - question if balance vs offloading R leg Postural control: Left lateral lean Standing balance support: Bilateral upper extremity supported;During functional activity Standing balance-Leahy Scale: Poor Standing balance comment: reliant on staff and RW                            Cognition Arousal/Alertness: Awake/alert Behavior During Therapy: [facility]  for tasks assessed/performed Overall Cognitive Status: Impaired/Different from baseline Area of Impairment: Problem solving;Following commands                       Following Commands: Follows multi-step  commands inconsistently     Problem Solving: Slow processing;Requires verbal cues;Requires tactile cues General Comments: Pt had difficulty following sequencing commands for transfers - seems to be distracted by pain      Exercises Total Joint Exercises Ankle Circles/Pumps: AROM;Both;10 reps;Supine Quad Sets: AROM;Right;Supine;10 reps Short Arc QuadSinclair Ship;Right;10 reps;Supine Heel Slides:  (attempted but painful/unable) Hip ABduction/ADduction: AAROM;Both;Supine;10 reps Knee Flexion: AAROM;Right;5 reps;Seated (gentle with towel under foot to slide) Goniometric ROM: R knee lacking 5 ext; only  20-30 degrees flexion    General Comments General comments (skin integrity, edema, etc.): Pt was on 3 LPM O2 with sats 95%; placed on RA and maintained >90% throughout session      Pertinent Vitals/Pain Pain Assessment: 0-10 Pain Score: 5  (5/10 rest; 9/10 activity) Faces Pain Scale: Hurts whole lot Pain Location: R knee Pain Descriptors / Indicators: Discomfort;Sore;Aching;Sharp;Dull;Grimacing;Guarding;Operative site guarding Pain Intervention(s): Limited activity within patient's tolerance;Monitored during session;RN gave pain meds during session;Repositioned;Ice applied    Home Living Family/patient expects to be discharged to:: Private residence Living Arrangements: Spouse/significant other Available Help at Discharge: Family Type of Home: House Home Access: Stairs to enter Entrance Stairs-Rails: Left Home Layout: One level Home Equipment: Walker - 2 wheels;Crutches;Cane - single point;Wheelchair - manual Additional Comments: wife named [name]    Prior Function Level of Independence: Independent      Comments: reports enjoys playing golf and does all the yard work   PT Goals (current goals can now be found in the care plan section) Acute Rehab PT Goals Patient Stated Goal: to be able to play golf again PT Goal Formulation: With patient Time For Goal Achievement:  10/07/20 Potential to Achieve Goals: Good Progress towards PT goals: Progressing toward goals    Frequency    7X/week      PT Plan Current plan remains appropriate    Co-evaluation PT/OT/SLP Co-Evaluation/Treatment: Yes Reason for Co-Treatment: Complexity of the patient's impairments (multi-system involvement);For patient/therapist safety PT goals addressed during session: Mobility/safety with mobility;Strengthening/ROM OT goals addressed during session: Proper use of Adaptive equipment and DME;ADL's and self-care      AM-PAC PT "6 Clicks" Mobility   Outcome Measure  Help needed turning from your back to your side while in a flat bed without using bedrails?: A Lot Help needed moving from lying on your back to sitting on the side of a flat bed without using bedrails?: A Lot Help needed moving to and from a bed to a chair (including a wheelchair)?: A Lot Help needed standing up from a chair using your arms (e.g., wheelchair or bedside chair)?: A Lot Help needed to walk in hospital room?: A Lot Help needed climbing 3-5 steps with a railing? : Total 6 Click Score: 11    End of Session Equipment Utilized During Treatment: Gait belt;Right knee immobilizer Activity Tolerance: Patient limited by pain Patient left: in chair;with call bell/phone within reach;with chair alarm set;with family/visitor present;Other (comment) (Pillow under R leg to elevate , knee straight in immobilizer with ice) Nurse Communication: Mobility status PT Visit Diagnosis: Muscle weakness (generalized) (M62.81);Difficulty in walking, not elsewhere classified (R26.2);Pain;Other symptoms and signs involving the nervous system (R29.898) Pain - Right/Left: Right Pain - part of body: Knee     Time: 1601-0932 PT Time Calculation (  min) (ACUTE ONLY): 38 min  Charges:  $Gait Training: 8-22 mins $Therapeutic Exercise: 8-22 mins                     Abran Richard, PT Acute Rehab Services Pager [phone] [facility]  [facility] [place] 10/02/2020, 4:20 PM

## 2020-10-02 NOTE — Progress Notes (Signed)
PROGRESS NOTE  Justin Rose  DOB: 07-14-1934  PCP: Karlene Einstein, MD FMB:[id number]  DOA: 09/30/2020  LOS: 1 day   Chief complaint: Stroke after TKA  Brief narrative: Justin Rose is a 86 y.o. male with no significant cardiovascular problem who underwent elective right total knee on 11/15.    Later that night, patient complained of frontal headache, some visual disturbances like he was seeing squiggles but no visual field loss.  Also developed incoordination of hand movements as he was not able to dial his cell phone or answer it.  He felt tremulous. Rapid response called at night, stat CT scan head was normal.  Neurology was consulted.  With no significant neurological deficit, was not intervention candidate.  Subsequent MRI showed multifocal embolic stroke. TRH consulted after rapid response on 11/15 midnight.  Patient was transferred to was low to [facility] for stroke work-up.  Subjective: Patient was seen and examined this morning. Pleasant elderly Caucasian male. Lying down in bed.  Not in distress. Wife at bedside. Ongoing stroke work-up. Patient feels better.  Assessment/Plan: Acute multiple territory ischemic stroke -suspect embolic related to long bone fracture.   -Did not get TPA because of minimal or no neurological deficits -MRI with numerous small scattered acute cortical and subcortical infarcts within the bilateral frontal, parietal and occipital lobes. CTA head and neck did not show any significant carotid or vertebral artery stenosis. -LDL 74, A1c 5.6. -Unclear etiology of stroke, likely embolic stroke of undetermined origin. -Patient did not want implantable loop recorder. Plan is to discharge him with 30-day cardiac event monitor which will be delivered to his house per neurology. -TTE with EF 60 to 65%, normal LV function, no atrial level shunt and no intracardiac source. -PT/OT evaluation obtained.  CIR recommended.   -Currently on aspirin 325 mg twice  daily as part of DVT prophylaxis postsurgically.  May need adjustment because of stroke. -Continue Lipitor 20 mg daily.  Osteoarthritis status post right total knee:  -Postop management as per surgery.   -Currently adequate pain management and therapies as above.  Mobility: PT eval appreciated Code Status:   Code Status: Full Code  Nutritional status: Body mass index is 26.3 kg/m.     Diet Order            Diet regular Room service appropriate? Yes; Fluid consistency: Thin  Diet effective now                 DVT prophylaxis: SCDs Start: 09/30/20 1027 Place TED hose Start: 09/30/20 1027   Antimicrobials:  None Fluid: Okay to stop IV fluid. Consultants: Neurology, orthopedics Family Communication:  Wife at bedside  Status is: Inpatient  Remains inpatient appropriate because: Ongoing stroke work-up   Dispo: The patient is from: Home              Anticipated d/c is to: Home versus CIR              Anticipated d/c date is: 2 days              Patient currently is not medically stable to d/c.       Infusions:  . methocarbamol (ROBAXIN) IV      Scheduled Meds: . aspirin EC  325 mg Oral BID  . atorvastatin  20 mg Oral Daily  . brimonidine  1 drop Left Eye Daily   And  . timolol  1 drop Left Eye Daily  . docusate sodium  100 mg  Oral BID  . ipratropium  2 spray Each Nare Daily  . loratadine  10 mg Oral Daily  . metoprolol tartrate  50 mg Oral Once    Antimicrobials: Anti-infectives (From admission, onward)   Start     Dose/Rate Route Frequency Ordered Stop   09/30/20 1400  ceFAZolin (ANCEF) IVPB 2g/100 mL premix        2 g 200 mL/hr over 30 Minutes Intravenous Every 6 hours 09/30/20 1026 09/30/20 2232   09/30/20 0600  ceFAZolin (ANCEF) IVPB 2g/100 mL premix        2 g 200 mL/hr over 30 Minutes Intravenous On call to O.R. 09/30/20 0530 09/30/20 0714      PRN meds: acetaminophen, bisacodyl, diphenhydrAMINE, lip balm, menthol-cetylpyridinium **OR**  phenol, methocarbamol **OR** methocarbamol (ROBAXIN) IV, metoCLOPramide **OR** metoCLOPramide (REGLAN) injection, morphine injection, ondansetron **OR** ondansetron (ZOFRAN) IV, oxyCODONE, polyethylene glycol, sodium phosphate, traMADol   Objective: Vitals:   10/02/20 1226 10/02/20 1529  BP: (!) 116/93 119/73  Pulse: 89 81  Resp: 18 18  Temp: 98.1 F (36.7 C) 98.4 F (36.9 C)  SpO2: 98% 95%    Intake/Output Summary (Last 24 hours) at 10/02/2020 1629 Last data filed at 10/02/2020 0318 Gross per 24 hour  Intake --  Output 1500 ml  Net -1500 ml   Filed Weights   09/30/20 0543  Weight: 85.5 kg   Weight change:  Body mass index is 26.3 kg/m.   Physical Exam: General exam: Appears calm and comfortable.  Not in physical distress Skin: No rashes, lesions or ulcers. HEENT: Atraumatic, normocephalic, supple neck, no obvious bleeding Lungs: Clear to auscultation bilaterally CVS: Regular rate and rhythm, no murmur GI/Abd soft, nontender, nondistended, bowel sound present CNS: , Alert, awake, oriented x3 Psychiatry: Mood appropriate Extremities: No pedal edema, no calf tenderness  Data Review: I have personally reviewed the laboratory data and studies available.  Recent Labs  Lab 10/01/20 0113  WBC 15.7*  HGB 11.5*  HCT 35.1*  MCV 94.6  PLT 203   Recent Labs  Lab 10/01/20 0113  NA 137  K 4.1  CL 105  CO2 20*  GLUCOSE 173*  BUN 16  CREATININE 0.81  CALCIUM 9.0    F/u labs ordered  Signed, Terrilee Croak, MD [facility] 10/02/2020

## 2020-10-03 DIAGNOSIS — R279 Unspecified lack of coordination: Secondary | ICD-10-CM | POA: Diagnosis not present

## 2020-10-03 LAB — CBC
HCT: 30.4 % — ABNORMAL LOW (ref 39.0–52.0)
Hemoglobin: 10.1 g/dL — ABNORMAL LOW (ref 13.0–17.0)
MCH: 31.9 pg (ref 26.0–34.0)
MCHC: 33.2 g/dL (ref 30.0–36.0)
MCV: 95.9 fL (ref 80.0–100.0)
Platelets: 168 10*3/uL (ref 150–400)
RBC: 3.17 MIL/uL — ABNORMAL LOW (ref 4.22–5.81)
RDW: 13.3 % (ref 11.5–15.5)
WBC: 10.7 10*3/uL — ABNORMAL HIGH (ref 4.0–10.5)
nRBC: 0 % (ref 0.0–0.2)

## 2020-10-03 LAB — BASIC METABOLIC PANEL
Anion gap: 10 (ref 5–15)
BUN: 17 mg/dL (ref 8–23)
CO2: 25 mmol/L (ref 22–32)
Calcium: 8.9 mg/dL (ref 8.9–10.3)
Chloride: 105 mmol/L (ref 98–111)
Creatinine, Ser: 0.69 mg/dL (ref 0.61–1.24)
GFR, Estimated: >60 mL/min (ref >60)
Glucose, Bld: 136 mg/dL — ABNORMAL HIGH (ref 70–99)
Potassium: 4 mmol/L (ref 3.5–5.1)
Sodium: 140 mmol/L (ref 135–145)

## 2020-10-03 NOTE — Progress Notes (Signed)
Physical Therapy Treatment Patient Details Name: Justin Rose MRN: [id number] DOB: 02/19/1934 Today's Date: 10/03/2020    History of Present Illness Pt is 86 yo male s/p R TKA 11/215/21. Post op events/symptoms documented MRI(+) Numerous small scattered acute cortical and subcortical infarcts within the bilateral frontal, parietal and occipital lobes. Findings are likely secondary to an embolic process.    PT Comments    Treated pt in conjunction with OT this date for max pt safety and to advance mobility. Pt has made significant progress towards his goals as he was able to ambulate ~20 ft this date with a RW and modAx2. The RW was switched out and adjusted to pt's height by end of session as pt tends to flex his trunk during any standing tasks. The pt was initially resistive to shift his weight to his R leg, thus practiced static weight shifting between legs on 1st standing bout then provided verbal and extensive tactile cues/assistance to shift his weight appropriately bilaterally during gait on 2nd standing bout to allow for increased feet clearance and step length, with success as distance progressed. He continues to be primarily limited by his pain in his R leg. Also, upon arrival his R knee immobilizer was donned primarily on the lower leg, thus corrected and educated nursing on necessity to have it donned to cover knee appropriately. Will continue to follow acutely. Pt would benefit greatly from intensive therapy services in the CIR setting to increase his independence and safety with functional mobility through addressing his deficits mentioned below.    Follow Up Recommendations  CIR;Supervision/Assistance - 24 hour     Equipment Recommendations  Rolling walker with 5" wheels;3in1 (PT);Other (comment) (further assessment next venue)    Recommendations for Other Services Rehab consult     Precautions / Restrictions Precautions Precautions: Fall Required Braces or Orthoses: Knee  Immobilizer - Right Knee Immobilizer - Right: Discontinue once straight leg raise with < 10 degree lag Restrictions RLE Weight Bearing: Weight bearing as tolerated Other Position/Activity Restrictions: WBAT    Mobility  Bed Mobility               General bed mobility comments: Pt up in recliner upon arrival.  Transfers Overall transfer level: Needs assistance Equipment used: Rolling walker (2 wheeled) Transfers: Sit to/from Stand Sit to Stand: Mod assist;+2 physical assistance         General transfer comment: Sit > stand to RW from recliner x2 reps, providing verbal and tactile cues to scoot anteriorly and place R foot anterior to L to improve quality of transfers, with success. ModAx2 to power up to stand and cues provided to pull R leg posteriorly as he rises to improve upright posture.  Ambulation/Gait Ambulation/Gait assistance: Mod assist;+2 physical assistance Gait Distance (Feet): 20 Feet Assistive device: Rolling walker (2 wheeled) Gait Pattern/deviations: Step-to pattern;Decreased stance time - right;Decreased stance time - left;Decreased stride length;Decreased weight shift to right;Antalgic;Narrow base of support;Trunk flexed Gait velocity: decreased Gait velocity interpretation: <1.31 ft/sec, indicative of household ambulator General Gait Details: Pt ambulates with trunk flexed, requiring verbal and tactile cues at chest and sacrum repeatedly to improve upright posture. Switched RW and corrected to appropriate height for pt to dec trunk felxion, with min success. Pt demonstrates decreased R leg weight shift due to pain thus provided assistance and continual cues to shift weight appropriately bilaterally to improve bilat step length, with mod success as distance progressed as pt was initially resistive physically but reports trying to follow cues.  Stairs             Wheelchair Mobility    Modified Rankin (Stroke Patients Only) Modified Rankin (Stroke  Patients Only) Pre-Morbid Rankin Score: No symptoms Modified Rankin: Moderately severe disability     Balance Overall balance assessment: Needs assistance Sitting-balance support: No upper extremity supported;Feet supported Sitting balance-Leahy Scale: Fair Sitting balance - Comments: Pt sitting in recliner without LOB. Postural control: Left lateral lean Standing balance support: Bilateral upper extremity supported;During functional activity;No upper extremity supported Standing balance-Leahy Scale: Fair Standing balance comment: 1-2 UE support majority of time (~95% of time), but pt able to reach off BOS minimally at sink to perform tasks with OT with 1-no UE support and minA-min guard assist to maintain balance.                            Cognition Arousal/Alertness: Awake/alert Behavior During Therapy: WFL for tasks assessed/performed Overall Cognitive Status: Impaired/Different from baseline Area of Impairment: Following commands;Problem solving                       Following Commands: Follows multi-step commands inconsistently     Problem Solving: Slow processing;Requires verbal cues;Requires tactile cues;Difficulty sequencing General Comments: Pt A&Ox4. Difficulty noted with sequencing proper weight shifting necessary to ambulate, requiring extensive verbal and tactile cues. Repeated cues provided for proper hand placement with transfers.      Exercises      General Comments        Pertinent Vitals/Pain Pain Assessment: Faces Faces Pain Scale: Hurts whole lot Pain Location: R knee with mobility Pain Descriptors / Indicators: Discomfort;Sore;Grimacing;Guarding;Operative site guarding Pain Intervention(s): Limited activity within patient's tolerance;Monitored during session;Repositioned;Patient requesting pain meds-RN notified    Home Living                      Prior Function            PT Goals (current goals can now be found in  the care plan section) Acute Rehab PT Goals Patient Stated Goal: to be able to stand up and walk PT Goal Formulation: With patient Time For Goal Achievement: 10/07/20 Potential to Achieve Goals: Good Progress towards PT goals: Progressing toward goals    Frequency    7X/week      PT Plan Current plan remains appropriate    Co-evaluation PT/OT/SLP Co-Evaluation/Treatment: Yes Reason for Co-Treatment: For patient/therapist safety;To address functional/ADL transfers PT goals addressed during session: Mobility/safety with mobility;Balance        AM-PAC PT "6 Clicks" Mobility   Outcome Measure  Help needed turning from your back to your side while in a flat bed without using bedrails?: A Lot Help needed moving from lying on your back to sitting on the side of a flat bed without using bedrails?: A Lot Help needed moving to and from a bed to a chair (including a wheelchair)?: A Lot Help needed standing up from a chair using your arms (e.g., wheelchair or bedside chair)?: A Lot Help needed to walk in hospital room?: A Lot Help needed climbing 3-5 steps with a railing? : Total 6 Click Score: 11    End of Session Equipment Utilized During Treatment: Gait belt;Right knee immobilizer Activity Tolerance: Patient limited by pain;Patient tolerated treatment well Patient left: in chair;with call bell/phone within reach;with chair alarm set Nurse Communication: Mobility status;Patient requests pain meds PT Visit Diagnosis: Muscle weakness (generalized) (M62.81);Difficulty  in walking, not elsewhere classified (R26.2);Pain;Other symptoms and signs involving the nervous system (R29.898);Unsteadiness on feet (R26.81);Other abnormalities of gait and mobility (R26.89) Pain - Right/Left: Right Pain - part of body: Knee     Time: 0926-1012 PT Time Calculation (min) (ACUTE ONLY): 46 min  Charges:  $Gait Training: 23-37 mins                     Moishe Spice, PT, DPT Acute Rehabilitation  Services  Pager: [phone] Office: [place] 10/03/2020, 10:26 AM

## 2020-10-03 NOTE — Plan of Care (Signed)
  Problem: Pain Management: Goal: Pain level will decrease with appropriate interventions Outcome: Progressing   Problem: Nutrition: Goal: Adequate nutrition will be maintained Outcome: Progressing   Problem: Safety: Goal: Ability to remain free from injury will improve Outcome: Progressing   

## 2020-10-03 NOTE — Progress Notes (Signed)
PROGRESS NOTE  Justin Rose  DOB: 02-10-1934  PCP: Karlene Einstein, MD ZSW:[id number]  DOA: 09/30/2020  LOS: 2 days   Chief complaint: Stroke after TKA  Brief narrative: Justin Rose is a 86 y.o. male with no significant cardiovascular problem who underwent elective right total knee on 11/15.    Later that night, patient complained of frontal headache, some visual disturbances like he was seeing squiggles but no visual field loss.  Also developed incoordination of hand movements as he was not able to dial his cell phone or answer it.  He felt tremulous. Rapid response called at night, stat CT scan head was normal.  Neurology was consulted.  With no significant neurological deficit, was not intervention candidate.  Subsequent MRI showed multifocal embolic stroke. TRH consulted after rapid response on 11/15 midnight.  Patient was transferred to was low to [facility] for stroke work-up.  Subjective: Patient was seen and examined this morning.  Working with physical therapy.  Really struggling to make few steps.  Patient understands that he is not strong enough to go home.  Pending CIR placement.    Assessment/Plan: Acute multiple territory ischemic stroke -suspect embolic related to long bone fracture.   -Did not get TPA because of minimal or no neurological deficits -MRI with numerous small scattered acute cortical and subcortical infarcts within the bilateral frontal, parietal and occipital lobes. CTA head and neck did not show any significant carotid or vertebral artery stenosis. -LDL 74, A1c 5.6. -Unclear etiology of stroke, likely embolic stroke of undetermined origin. -Patient did not want implantable loop recorder. Plan is to discharge him with 30-day cardiac event monitor -TTE with EF 60 to 65%, normal LV function, no atrial level shunt and no intracardiac source. -PT/OT evaluation obtained.  CIR recommended.   -Per neurology recommendation, patient is on aspirin 81 mg daily  and Plavix 75 mg daily for 3 weeks afterwards he will be on aspirin monotherapy. -Continue Lipitor 20 mg daily.  Osteoarthritis status post right total knee:  -Postop management as per surgery.   -Currently adequate pain management and therapies as above. -PT following.  CIR recommended.  Mobility: PT eval appreciated Code Status:   Code Status: Full Code  Nutritional status: Body mass index is 26.3 kg/m.     Diet Order            Diet regular Room service appropriate? Yes; Fluid consistency: Thin  Diet effective now                 DVT prophylaxis: SCDs Start: 09/30/20 1027 Place TED hose Start: 09/30/20 1027   Antimicrobials:  None Fluid: Okay to stop IV fluid. Consultants: Neurology, orthopedics Family Communication:  Family not at bedside  Status is: Inpatient  Remains inpatient appropriate because: Pending CIR placement   Dispo: The patient is from: Home              Anticipated d/c is to: CIR              Anticipated d/c date is: Whenever bed is available              Patient currently is medically stable to d/c.    Infusions:  . methocarbamol (ROBAXIN) IV      Scheduled Meds: . aspirin  81 mg Oral Daily  . atorvastatin  20 mg Oral Daily  . brimonidine  1 drop Left Eye Daily   And  . timolol  1 drop Left Eye Daily  .  clopidogrel  75 mg Oral Daily  . docusate sodium  100 mg Oral BID  . ipratropium  2 spray Each Nare Daily  . loratadine  10 mg Oral Daily  . metoprolol tartrate  50 mg Oral Once    Antimicrobials: Anti-infectives (From admission, onward)   Start     Dose/Rate Route Frequency Ordered Stop   09/30/20 1400  ceFAZolin (ANCEF) IVPB 2g/100 mL premix        2 g 200 mL/hr over 30 Minutes Intravenous Every 6 hours 09/30/20 1026 09/30/20 2232   09/30/20 0600  ceFAZolin (ANCEF) IVPB 2g/100 mL premix        2 g 200 mL/hr over 30 Minutes Intravenous On call to O.R. 09/30/20 0530 09/30/20 0714      PRN meds: acetaminophen, bisacodyl,  diphenhydrAMINE, lip balm, menthol-cetylpyridinium **OR** phenol, methocarbamol **OR** methocarbamol (ROBAXIN) IV, metoCLOPramide **OR** metoCLOPramide (REGLAN) injection, morphine injection, ondansetron **OR** ondansetron (ZOFRAN) IV, oxyCODONE, polyethylene glycol, sodium phosphate, traMADol   Objective: Vitals:   10/03/20 0346 10/03/20 0813  BP: 131/78 136/81  Pulse: 93 86  Resp: 18 18  Temp: 98.5 F (36.9 C) 98.5 F (36.9 C)  SpO2: 96% 97%    Intake/Output Summary (Last 24 hours) at 10/03/2020 1027 Last data filed at 10/03/2020 1009 Gross per 24 hour  Intake 240 ml  Output 1125 ml  Net -885 ml   Filed Weights   09/30/20 0543  Weight: 85.5 kg   Weight change:  Body mass index is 26.3 kg/m.   Physical Exam: General exam: Appears calm and comfortable.  Participating with physical therapy with difficulty Skin: No rashes, lesions or ulcers. HEENT: Atraumatic, normocephalic, supple neck, no obvious bleeding Lungs: Clear to auscultation bilaterally CVS: Regular rate and rhythm, no murmur GI/Abd soft, nontender, nondistended, bowel sound present CNS: , Alert, awake, oriented x3 Psychiatry: Mood appropriate Extremities: No pedal edema, no calf tenderness  Data Review: I have personally reviewed the laboratory data and studies available.  Recent Labs  Lab 10/01/20 0113 10/03/20 0256  WBC 15.7* 10.7*  HGB 11.5* 10.1*  HCT 35.1* 30.4*  MCV 94.6 95.9  PLT 203 168   Recent Labs  Lab 10/01/20 0113 10/03/20 0256  NA 137 140  K 4.1 4.0  CL 105 105  CO2 20* 25  GLUCOSE 173* 136*  BUN 16 17  CREATININE 0.81 0.69  CALCIUM 9.0 8.9    F/u labs ordered  Signed, Terrilee Croak, MD [facility] 10/03/2020

## 2020-10-03 NOTE — Evaluation (Signed)
Speech Language Pathology Evaluation Patient Details Name: Abdulhadi Stopa MRN: [id number] DOB: 1933-12-09 Today's Date: 10/03/2020 Time: 7903-8333 SLP Time Calculation (min) (ACUTE ONLY): 15 min  Problem List:  Patient Active Problem List   Diagnosis Date Noted  . Primary osteoarthritis of knee 10/01/2020  . Arterial ischemic stroke, multifocal, multiple vascular territories, acute ([place]) 10/01/2020  . OA (osteoarthritis) of knee 09/30/2020  . Primary osteoarthritis of right knee 09/30/2020  . Discoordination 09/30/2020   Past Medical History:  Past Medical History:  Diagnosis Date  . Arthritis   . Basal cell carcinoma (BCC) of head   . Basal cell carcinoma, ear    Left ear  . Glaucoma    Left eye   Past Surgical History:  Past Surgical History:  Procedure Laterality Date  . BACK SURGERY     2021  . BASAL CELL CARCINOMA EXCISION    . EYE SURGERY     08/2020 left eye  . HAND SURGERY    . PARATHYROIDECTOMY    . TONSILLECTOMY    . TOTAL KNEE ARTHROPLASTY Right 09/30/2020   Procedure: TOTAL KNEE ARTHROPLASTY;  Surgeon: Gaynelle Arabian, MD;  Location: [facility]S;  Service: Orthopedics;  Laterality: Right;   HPI:  Josyah Achor, 86 y.o. male presented 11/15 for total knee arthroplasty. Postop from right knee TKR, with headache discoordination and floaters in vision. MRI brain revealed numerous small scattered acute cortical and subcortical infarcts within the bilateral frontal, parietal and occipital lobes- likely secondary to an embolic process.   Assessment / Plan / Recommendation Clinical Impression   Pt scored a 25/30 on the SLUMS, suggestive of mild cognitive impairment. Throughout testing he also needed additional time and at times repetition of instructions for adequate comprehension and selective attention. Question if this is an acute change from his baseline, but he is adamant that it is not and that he does not need speech therapy. Although pt does not want SLP in the acute  setting, I would still recommend SLP f/u when he reaches CIR level for follow up.     SLP Assessment  SLP Recommendation/Assessment: All further Speech Lanaguage Pathology  needs can be addressed in the next venue of care SLP Visit Diagnosis: Cognitive communication deficit (R41.841)    Follow Up Recommendations  Inpatient Rehab    Frequency and Duration           SLP Evaluation Cognition  Overall Cognitive Status: No family/caregiver present to determine baseline cognitive functioning Arousal/Alertness: Awake/alert Orientation Level: Oriented X4 Attention: Selective Selective Attention: Impaired Selective Attention Impairment: Verbal basic Memory: Impaired Memory Impairment: Storage deficit Problem Solving: Impaired Problem Solving Impairment: Verbal complex       Comprehension  Auditory Comprehension Overall Auditory Comprehension: Impaired Commands: Impaired Complex Commands: 75-100% accurate    Expression Expression Primary Mode of Expression: Verbal Verbal Expression Overall Verbal Expression: Appears within functional limits for tasks assessed   Oral / Motor  Motor Speech Overall Motor Speech: Appears within functional limits for tasks assessed   GO                    Osie Bond., M.A. [place] Acute Rehabilitation Services Pager [phone] Office [phone]  10/03/2020, 3:48 PM

## 2020-10-03 NOTE — Progress Notes (Signed)
Occupational Therapy Treatment Patient Details Name: Justin Rose MRN: [id number] DOB: 12-12-1933 Today's Date: 10/03/2020    History of present illness Pt is 86 yo male s/p R TKA 11/215/21. Post op events/symptoms documented MRI(+) Numerous small scattered acute cortical and subcortical infarcts within the bilateral frontal, parietal and occipital lobes. Findings are likely secondary to an embolic process.   OT comments  Patient continues to make steady progress towards goals in skilled OT session. Patient's session encompassed co-treat with PT in order to advance gait, address motor planning deficits, and address ADLs in standing. Pt continues to be limited by pain especially in transitions from sit<>stand, requiring max A of 2. Pt requires constant multi-modal cues in order to maintain upright posture, and constant cuing to place weight on R leg to advance L left forward. Pt also demonstrates difficulty in walker management, despite having a walker at home. Pt currently requiring increased time for standing ADLs at sink, and min A for sequencing. Pt continues to greatly benefit from CIR; will continue to follow acutely.     CIR    Equipment Recommendations  3 in 1 bedside commode    Recommendations for Other Services      Precautions / Restrictions Precautions Precautions: Fall Required Braces or Orthoses: Knee Immobilizer - Right Knee Immobilizer - Right: Discontinue once straight leg raise with < 10 degree lag Restrictions Weight Bearing Restrictions: No RLE Weight Bearing: Weight bearing as tolerated Other Position/Activity Restrictions: WBAT       Mobility Bed Mobility               General bed mobility comments: Pt up in recliner upon arrival.  Transfers Overall transfer level: Needs assistance Equipment used: Rolling walker (2 wheeled) Transfers: Sit to/from Stand Sit to Stand: Mod assist;+2 physical assistance         General transfer comment: Sit > stand to  RW from recliner x2 reps, providing verbal and tactile cues to scoot anteriorly and place R foot anterior to L to improve quality of transfers, with success. ModAx2 to power up to stand and cues provided to pull R leg posteriorly as he rises to improve upright posture.    Balance Overall balance assessment: Needs assistance Sitting-balance support: No upper extremity supported;Feet supported Sitting balance-Leahy Scale: Fair Sitting balance - Comments: Pt sitting in recliner without LOB. Postural control: Left lateral lean Standing balance support: Bilateral upper extremity supported;During functional activity;No upper extremity supported Standing balance-Leahy Scale: Fair Standing balance comment: 1-2 UE support majority of time (~95% of time), but pt able to reach off BOS minimally at sink to perform tasks with OT with 1-no UE support and minA-min guard assist to maintain balance.                           ADL either performed or assessed with clinical judgement   ADL       Grooming: Wash/dry hands;Wash/dry face;Oral care;Minimal assistance;Cueing for safety;Cueing for sequencing                   Toilet Transfer: +2 for physical assistance;Maximal assistance           Functional mobility during ADLs: +2 for physical assistance;Maximal assistance;Cueing for safety;Cueing for sequencing General ADL Comments: pt requires cues throughout session for safety and hand placement     Vision       Perception     Praxis      Cognition Arousal/Alertness: Awake/alert  Behavior During Therapy: WFL for tasks assessed/performed Overall Cognitive Status: Impaired/Different from baseline Area of Impairment: Following commands;Problem solving;Safety/judgement                       Following Commands: Follows multi-step commands inconsistently Safety/Judgement: Decreased awareness of safety   Problem Solving: Slow processing;Requires verbal cues;Requires  tactile cues;Difficulty sequencing General Comments: Pt A&Ox4. Difficulty noted with sequencing proper weight shifting necessary to ambulate, requiring extensive verbal and tactile cues. Repeated cues provided for proper hand placement with transfers.        Exercises     Shoulder Instructions       General Comments      Pertinent Vitals/ Pain       Pain Assessment: Faces Faces Pain Scale: Hurts whole lot Pain Location: R knee with mobility Pain Descriptors / Indicators: Discomfort;Sore;Grimacing;Guarding;Operative site guarding Pain Intervention(s): Limited activity within patient's tolerance;Monitored during session;Repositioned;Patient requesting pain meds-RN notified  Home Living                                          Prior Functioning/Environment              Frequency  Min 2X/week        Progress Toward Goals  OT Goals(current goals can now be found in the care plan section)  Progress towards OT goals: Progressing toward goals  Acute Rehab OT Goals Patient Stated Goal: to be able to stand up and walk OT Goal Formulation: With patient/family Time For Goal Achievement: 10/16/20 Potential to Achieve Goals: Good  Plan Discharge plan remains appropriate    Co-evaluation      Reason for Co-Treatment: For patient/therapist safety;To address functional/ADL transfers PT goals addressed during session: Mobility/safety with mobility;Balance OT goals addressed during session: ADL's and self-care;Proper use of Adaptive equipment and DME      AM-PAC OT "6 Clicks" Daily Activity     Outcome Measure   Help from another person eating meals?: A Little Help from another person taking care of personal grooming?: A Little Help from another person toileting, which includes using toliet, bedpan, or urinal?: A Lot Help from another person bathing (including washing, rinsing, drying)?: A Lot Help from another person to put on and taking off regular  upper body clothing?: A Little Help from another person to put on and taking off regular lower body clothing?: A Lot 6 Click Score: 15    End of Session Equipment Utilized During Treatment: Rolling walker;Right knee immobilizer;Gait belt  OT Visit Diagnosis: Unsteadiness on feet (R26.81);Muscle weakness (generalized) (M62.81);Pain Pain - Right/Left: Right Pain - part of body: Knee   Activity Tolerance Patient tolerated treatment well   Patient Left in chair;with call bell/phone within reach;with chair alarm set   Nurse Communication Mobility status;Precautions;Weight bearing status        Time: 1610-9604 OT Time Calculation (min): 46 min  Charges: OT General Charges $OT Visit: 1 Visit OT Treatments $Self Care/Home Management : 8-22 mins  Corinne Ports E. Naylea Wigington, COTA/L Acute Rehabilitation Services [phone] [place] 10/03/2020, 12:25 PM

## 2020-10-03 NOTE — Progress Notes (Signed)
   Subjective: 3 Days Post-Op Procedure(s) (LRB): TOTAL KNEE ARTHROPLASTY (Right) Patient reports pain as mild.   Plan is to go Rehab after hospital stay.  Objective: Vital signs in last 24 hours: Temp:  [97.9 F (36.6 C)-98.8 F (37.1 C)] 98.5 F (36.9 C) (11/18 0346) Pulse Rate:  [81-93] 93 (11/18 0346) Resp:  [18-20] 18 (11/18 0346) BP: (103-131)/(73-93) 131/78 (11/18 0346) SpO2:  [95 %-100 %] 96 % (11/18 0346)  Intake/Output from previous day:  Intake/Output Summary (Last 24 hours) at 10/03/2020 0713 Last data filed at 10/03/2020 0536 Gross per 24 hour  Intake 120 ml  Output 1050 ml  Net -930 ml    Intake/Output this shift: No intake/output data recorded.  Labs: Recent Labs    10/01/20 0113 10/03/20 0256  HGB 11.5* 10.1*   Recent Labs    10/01/20 0113 10/03/20 0256  WBC 15.7* 10.7*  RBC 3.71* 3.17*  HCT 35.1* 30.4*  PLT 203 168   Recent Labs    10/01/20 0113 10/03/20 0256  NA 137 140  K 4.1 4.0  CL 105 105  CO2 20* 25  BUN 16 17  CREATININE 0.81 0.69  GLUCOSE 173* 136*  CALCIUM 9.0 8.9   No results for input(s): LABPT, INR in the last 72 hours.  EXAM General - Patient is Alert, Appropriate and Oriented Extremity - Intact pulses distally Dorsiflexion/Plantar flexion intact No cellulitis present Compartment soft Dressing/Incision - clean, dry, no drainage Motor Function - intact, moving foot and toes well on exam.   Past Medical History:  Diagnosis Date  . Arthritis   . Basal cell carcinoma (BCC) of head   . Basal cell carcinoma, ear    Left ear  . Glaucoma    Left eye    Assessment/Plan: 3 Days Post-Op Procedure(s) (LRB): TOTAL KNEE ARTHROPLASTY (Right) Principal Problem:   Arterial ischemic stroke, multifocal, multiple vascular territories, acute (HCC) Active Problems:   OA (osteoarthritis) of knee   Primary osteoarthritis of right knee   Discoordination   Primary osteoarthritis of knee   Up with therapy  OK to use  plavix from our standpoint  DVT Prophylaxis - Aspirin and plavix Weight-Bearing as tolerated to right leg Await CIR  Gaynelle Arabian 10/03/2020, 7:13 AM

## 2020-10-04 DIAGNOSIS — R279 Unspecified lack of coordination: Secondary | ICD-10-CM | POA: Diagnosis not present

## 2020-10-04 MED ORDER — TAMSULOSIN HCL 0.4 MG PO CAPS
0.4000 mg | ORAL_CAPSULE | Freq: Every day | ORAL | Status: DC
  Administered 2020-10-04 – 2020-10-09 (×6): 0.4 mg via ORAL
  Filled 2020-10-04 (×6): qty 1

## 2020-10-04 MED ORDER — PSYLLIUM 95 % PO PACK
1.0000 | PACK | Freq: Every day | ORAL | Status: DC
  Administered 2020-10-04 – 2020-10-08 (×4): 1 via ORAL
  Filled 2020-10-04 (×7): qty 1

## 2020-10-04 MED ORDER — PANTOPRAZOLE SODIUM 40 MG PO TBEC
40.0000 mg | DELAYED_RELEASE_TABLET | Freq: Every day | ORAL | Status: DC
  Administered 2020-10-04 – 2020-10-09 (×6): 40 mg via ORAL
  Filled 2020-10-04 (×6): qty 1

## 2020-10-04 MED ORDER — SENNOSIDES-DOCUSATE SODIUM 8.6-50 MG PO TABS
1.0000 | ORAL_TABLET | Freq: Every day | ORAL | Status: DC
  Administered 2020-10-04 – 2020-10-07 (×4): 1 via ORAL
  Filled 2020-10-04 (×5): qty 1

## 2020-10-04 NOTE — TOC Progression Note (Addendum)
Transition of Care [facility]) - Progression Note    Patient Details  Name: Jerral Mccauley MRN: [id number] Date of Birth: [birth year]  [facility]) CM/SW Contact  Pollie Friar, RN Phone Number: 10/04/2020, 2:53 PM  Clinical Narrative:    Pt denies for CIR per [facility]. CM met with the patient and he is agreeable to being faxed out to the facilities that accept [employer].  TOC following and will provide bed offers. The facility will have to start insurance auth once selection made.   1623: received word that patient has decided to appeal the denial for CIR. Cm verified this with the patient. Cm has updated Claiborne Billings with CIR.   Expected Discharge Plan: [place] Barriers to Discharge: Continued Medical Work up, SNF Pending bed offer, Ship broker  Expected Discharge Plan and Services Expected Discharge Plan: [place] Choice: [place] arrangements for the past 2 months: Single Family Home                 DME Arranged: N/A DME Agency: NA       HH Arranged: NA HH Agency: NA         Social Determinants of Health (SDOH) Interventions    Readmission Risk Interventions No flowsheet data found.

## 2020-10-04 NOTE — Progress Notes (Signed)
Physical Therapy Treatment Patient Details Name: Justin Rose MRN: [id number] DOB: Sep 01, 1934 Today's Date: 10/04/2020    History of Present Illness Pt is 86 yo male s/p R TKA 11/215/21. Post op events/symptoms documented MRI(+) Numerous small scattered acute cortical and subcortical infarcts within the bilateral frontal, parietal and occipital lobes. Findings are likely secondary to an embolic process.    PT Comments    Focused session on improving R knee ROM and R leg muscular strength. Pt demonstrates progress towards his goals as he has improved R knee flexion AAROM this date of 52 degrees. He still lacks 10 degrees of R knee extension though. Pt requires extra time and step-by-step cues to sequence all functional mobility tasks. He required minA with the [facility] elevated to come to sit this date. He continues to require modAx2 to come to stand and step to the L to transfer with a RW to his recliner, demonstrating deficits in properly weight shifting between his legs, clearing his feet, and maintaining an upright posture. Will continue to follow acutely. Pt would benefit greatly from intensive therapy services in the CIR setting. He is very motivated to improve and participate.   Follow Up Recommendations  CIR;Supervision/Assistance - 24 hour     Equipment Recommendations  Rolling walker with 5" wheels;3in1 (PT);Other (comment) (further assessment next venue)    Recommendations for Other Services Rehab consult     Precautions / Restrictions Precautions Precautions: Fall Required Braces or Orthoses: Knee Immobilizer - Right Knee Immobilizer - Right: Discontinue once straight leg raise with < 10 degree lag Restrictions Weight Bearing Restrictions: No RLE Weight Bearing: Weight bearing as tolerated Other Position/Activity Restrictions: WBAT    Mobility  Bed Mobility Overal bed mobility: Needs Assistance Bed Mobility: Supine to Sit     Supine to sit: Min assist;HOB elevated      General bed mobility comments: HOB elevated, extra time and verbal cues provided to bring LEs off L EOB then pull with R hand on L bed rail to transition onto L elbow > hand to ascend trunk. MinA to square hips with EOB.  Transfers Overall transfer level: Needs assistance Equipment used: Rolling walker (2 wheeled) Transfers: Stand Pivot Transfers   Stand pivot transfers: Mod assist;+2 physical assistance       General transfer comment: Stand step to L EOB to recliner with use of RW and assistance from RN, cuing pt to push up from bed then sequence hand placement on RW while bringing R leg posteriorly as it was placed anteriorly with L posteriorly prior to coming to stand. Cues provided to shift weight to improve leg clearance and step length to move towards L and turn with RW.  Ambulation/Gait Ambulation/Gait assistance: Mod assist;+2 physical assistance Gait Distance (Feet): 3 Feet Assistive device: Rolling walker (2 wheeled) Gait Pattern/deviations: Step-to pattern;Decreased stance time - right;Decreased stance time - left;Decreased stride length;Decreased weight shift to right;Antalgic;Narrow base of support;Trunk flexed Gait velocity: decreased Gait velocity interpretation: <1.31 ft/sec, indicative of household ambulator General Gait Details: Required verbal and tactile cues at hips to weight shift along with visual cues for feet placement to step to L and turn with transfer.   Stairs             Wheelchair Mobility    Modified Rankin (Stroke Patients Only) Modified Rankin (Stroke Patients Only) Pre-Morbid Rankin Score: No symptoms Modified Rankin: Moderately severe disability     Balance Overall balance assessment: Needs assistance Sitting-balance support: Bilateral upper extremity supported;Feet supported Sitting balance-Leahy  Scale: Poor Sitting balance - Comments: UE support to sit statically with min guard assist for safety.   Standing balance support:  Bilateral upper extremity supported;During functional activity Standing balance-Leahy Scale: Poor Standing balance comment: Reliant on UE support on RW to stand and transfer to chair, modAx2.                            Cognition Arousal/Alertness: Awake/alert Behavior During Therapy: WFL for tasks assessed/performed Overall Cognitive Status: Within Functional Limits for tasks assessed                                 General Comments: WFL cognition for tasks performed this date, not formally assessed      Exercises Total Joint Exercises Quad Sets: AROM;Right;Supine;10 reps Heel Slides: Right;5 reps;Supine;AAROM Straight Leg Raises: Right;AAROM;5 reps;Supine Goniometric ROM: R knee goniometric measurements following stretches = -10 degrees extension, +52 degrees flexion Other Exercises Other Exercises: Prolonged gentle stretch to R knee into extension with lower leg resting on towel rolls and pt cued to allow posterior knee to sink towards bed, x~2 min hold. Other Exercises: Gentle, prolonged stretch to R knee into flexion x5 reps of ~10-45 sec each    General Comments General comments (skin integrity, edema, etc.): R knee goniometric measurements following stretches = -10 degrees extension, +52 degrees flexion      Pertinent Vitals/Pain Pain Assessment: Faces Faces Pain Scale: Hurts even more Pain Location: R knee with mobility Pain Descriptors / Indicators: Discomfort;Sore;Grimacing;Guarding;Operative site guarding Pain Intervention(s): Limited activity within patient's tolerance;Monitored during session;Repositioned;Patient requesting pain meds-RN notified;Ice applied    Home Living                      Prior Function            PT Goals (current goals can now be found in the care plan section) Acute Rehab PT Goals Patient Stated Goal: to improve PT Goal Formulation: With patient Time For Goal Achievement: 10/07/20 Potential to Achieve  Goals: Good Progress towards PT goals: Progressing toward goals    Frequency    7X/week      PT Plan Current plan remains appropriate    Co-evaluation              AM-PAC PT "6 Clicks" Mobility   Outcome Measure  Help needed turning from your back to your side while in a flat bed without using bedrails?: A Lot Help needed moving from lying on your back to sitting on the side of a flat bed without using bedrails?: A Lot Help needed moving to and from a bed to a chair (including a wheelchair)?: A Lot Help needed standing up from a chair using your arms (e.g., wheelchair or bedside chair)?: A Lot Help needed to walk in hospital room?: A Lot Help needed climbing 3-5 steps with a railing? : Total 6 Click Score: 11    End of Session Equipment Utilized During Treatment: Gait belt;Right knee immobilizer Activity Tolerance: Patient limited by pain;Patient tolerated treatment well Patient left: in chair;with call bell/phone within reach;with chair alarm set;with nursing/sitter in room;with family/visitor present Nurse Communication: Mobility status;Patient requests pain meds;Other (comment) (urinating difficulties, acid-reflux) PT Visit Diagnosis: Muscle weakness (generalized) (M62.81);Difficulty in walking, not elsewhere classified (R26.2);Pain;Other symptoms and signs involving the nervous system (R29.898);Unsteadiness on feet (R26.81);Other abnormalities of gait and mobility (R26.89) Pain -  Right/Left: Right Pain - part of body: Knee     Time: 0917-0959 PT Time Calculation (min) (ACUTE ONLY): 42 min  Charges:  $Therapeutic Exercise: 23-37 mins $Therapeutic Activity: 8-22 mins                     Moishe Spice, PT, DPT Acute Rehabilitation Services  Pager: [phone] Office: [phone]    Orvan Falconer 10/04/2020, 10:19 AM

## 2020-10-04 NOTE — NC FL2 (Signed)
[place] LEVEL OF CARE SCREENING TOOL     IDENTIFICATION  Patient Name: Justin Rose Birthdate: October 28, 1934 Sex: male Admission Date (Current Location): 09/30/2020  [facility] and [place] Number:   Monia Pouch)   Facility and Address:  [facility], [place] [address], [place], [place] [address]      Provider Number: [id number]  Attending Physician Name and Address:  Gaynelle Arabian, MD  Relative Name and Phone Number:       Current Level of Care: Hospital Recommended Level of Care: [place] Prior Approval Number:    Date Approved/Denied:   PASRR Number: [id number]  Discharge Plan: SNF    Current Diagnoses: Patient Active Problem List   Diagnosis Date Noted  . Primary osteoarthritis of knee 10/01/2020  . Arterial ischemic stroke, multifocal, multiple vascular territories, acute ([place]) 10/01/2020  . OA (osteoarthritis) of knee 09/30/2020  . Primary osteoarthritis of right knee 09/30/2020  . Discoordination 09/30/2020    Orientation RESPIRATION BLADDER Height & Weight     Self, Time, Situation, Place  Normal Continent Weight: 188 lb 9.6 oz (85.5 kg) Height:  5\' 11"  (180.3 cm)  BEHAVIORAL SYMPTOMS/MOOD NEUROLOGICAL BOWEL NUTRITION STATUS      Continent Diet (regular)  AMBULATORY STATUS COMMUNICATION OF NEEDS Skin   Extensive Assist Verbally Surgical wounds (closed right knee, no dressing)                       Personal Care Assistance Level of Assistance  Bathing, Feeding, Dressing Bathing Assistance: Maximum assistance Feeding assistance: Independent Dressing Assistance: Maximum assistance     Functional Limitations Info  Hearing, Sight Sight Info: Impaired (blurred vision) Hearing Info: Impaired (hard of hearing)      SPECIAL CARE FACTORS FREQUENCY  PT (By licensed PT), OT (By licensed OT), Speech therapy     PT Frequency: 5x/wk OT Frequency: 5x/wk     Speech Therapy Frequency: 5x/wk       Contractures Contractures Info: Not present    Additional Factors Info  Code Status, Allergies Code Status Info: Full Allergies Info: NKA           Current Medications (10/04/2020):  This is the current hospital active medication list Current Facility-Administered Medications  Medication Dose Route Frequency Provider Last Rate Last Admin  . acetaminophen (TYLENOL) tablet 325-650 mg  325-650 mg Oral Q6H PRN Edmisten, Kristie L, PA      . aspirin chewable tablet 81 mg  81 mg Oral Daily Ruta Hinds S, NP   81 mg at 10/04/20 0936  . atorvastatin (LIPITOR) tablet 20 mg  20 mg Oral Daily Rosalin Hawking, MD   20 mg at 10/04/20 0945  . bisacodyl (DULCOLAX) suppository 10 mg  10 mg Rectal Daily PRN Edmisten, Kristie L, PA      . brimonidine (ALPHAGAN) 0.2 % ophthalmic solution 1 drop  1 drop Left Eye Daily Gaynelle Arabian, MD   1 drop at 10/04/20 0946   And  . timolol (TIMOPTIC) 0.5 % ophthalmic solution 1 drop  1 drop Left Eye Daily Gaynelle Arabian, MD   1 drop at 10/04/20 0946  . clopidogrel (PLAVIX) tablet 75 mg  75 mg Oral Daily Einar Pheasant, NP   75 mg at 10/04/20 6759  . diphenhydrAMINE (BENADRYL) 12.5 MG/5ML elixir 12.5-25 mg  12.5-25 mg Oral Q4H PRN Edmisten, Kristie L, PA      . docusate sodium (COLACE) capsule 100 mg  100 mg Oral BID Edmisten,  Kristie L, PA   100 mg at 10/04/20 0945  . ipratropium (ATROVENT) 0.06 % nasal spray 2 spray  2 spray Each Nare Daily Edmisten, Kristie L, PA   2 spray at 10/04/20 0945  . lip balm (CARMEX) ointment   Topical PRN Aluisio, Pilar Plate, MD      . loratadine (CLARITIN) tablet 10 mg  10 mg Oral Daily Edmisten, Kristie L, PA   10 mg at 10/04/20 0947  . menthol-cetylpyridinium (CEPACOL) lozenge 3 mg  1 lozenge Oral PRN Edmisten, Kristie L, PA       Or  . phenol (CHLORASEPTIC) mouth spray 1 spray  1 spray Mouth/Throat PRN Edmisten, Kristie L, PA      . methocarbamol (ROBAXIN) tablet 500 mg  500 mg Oral Q6H PRN Edmisten, Kristie L, PA   500 mg at  10/03/20 2022   Or  . methocarbamol (ROBAXIN) 500 mg in dextrose 5 % 50 mL IVPB  500 mg Intravenous Q6H PRN Edmisten, Kristie L, PA      . metoCLOPramide (REGLAN) tablet 5-10 mg  5-10 mg Oral Q8H PRN Edmisten, Kristie L, PA       Or  . metoCLOPramide (REGLAN) injection 5-10 mg  5-10 mg Intravenous Q8H PRN Edmisten, Kristie L, PA      . metoprolol tartrate (LOPRESSOR) tablet 50 mg  50 mg Oral Once Norins, Michael E, MD      . morphine 2 MG/ML injection 0.5-1 mg  0.5-1 mg Intravenous Q2H PRN Edmisten, Kristie L, PA   1 mg at 10/03/20 2253  . ondansetron (ZOFRAN) tablet 4 mg  4 mg Oral Q6H PRN Edmisten, Kristie L, PA       Or  . ondansetron (ZOFRAN) injection 4 mg  4 mg Intravenous Q6H PRN Edmisten, Kristie L, PA   4 mg at 10/01/20 0320  . oxyCODONE (Oxy IR/ROXICODONE) immediate release tablet 5-10 mg  5-10 mg Oral Q4H PRN Edmisten, Kristie L, PA   5 mg at 10/03/20 2022  . pantoprazole (PROTONIX) EC tablet 40 mg  40 mg Oral Daily Maurice March, PA-C   40 mg at 10/04/20 0946  . polyethylene glycol (MIRALAX / GLYCOLAX) packet 17 g  17 g Oral Daily PRN Edmisten, Kristie L, PA      . sodium phosphate (FLEET) 7-19 GM/118ML enema 1 enema  1 enema Rectal Once PRN Edmisten, Kristie L, PA      . tamsulosin (FLOMAX) capsule 0.4 mg  0.4 mg Oral Daily Dahal, Binaya, MD      . traMADol (ULTRAM) tablet 50-100 mg  50-100 mg Oral Q6H PRN Edmisten, Kristie L, PA   100 mg at 10/04/20 0945     Discharge Medications: Please see discharge summary for a list of discharge medications.  Relevant Imaging Results:  Relevant Lab Results:   Additional Information SS#: [id number]  Geralynn Ochs, LCSW

## 2020-10-04 NOTE — Progress Notes (Signed)
   Subjective: 4 Days Post-Op Procedure(s) (LRB): TOTAL KNEE ARTHROPLASTY (Right) Patient reports pain as mild.   Patient seen in rounds for Dr. Wynelle Link. Patient is well, and has had no acute complaints or problems other than dyspepsia/reflux symptoms. He reports he typically takes Nexium at home but has not had it since surgery. Ambulated with PT yesterday. Denies CP, SHOB, N/V. Voiding without difficulty, positive flatus.  Plan is to go Rehab after hospital stay.  Objective: Vital signs in last 24 hours: Temp:  [98.3 F (36.8 C)-99 F (37.2 C)] 98.3 F (36.8 C) (11/19 0800) Pulse Rate:  [92-108] 94 (11/19 0800) Resp:  [18-19] 18 (11/19 0800) BP: (116-134)/(77-93) 116/79 (11/19 0800) SpO2:  [94 %-97 %] 95 % (11/19 0800)  Intake/Output from previous day:  Intake/Output Summary (Last 24 hours) at 10/04/2020 0833 Last data filed at 10/04/2020 0421 Gross per 24 hour  Intake --  Output 825 ml  Net -825 ml    Intake/Output this shift: No intake/output data recorded.  Labs: Recent Labs    10/03/20 0256  HGB 10.1*   Recent Labs    10/03/20 0256  WBC 10.7*  RBC 3.17*  HCT 30.4*  PLT 168   Recent Labs    10/03/20 0256  NA 140  K 4.0  CL 105  CO2 25  BUN 17  CREATININE 0.69  GLUCOSE 136*  CALCIUM 8.9   No results for input(s): LABPT, INR in the last 72 hours.  Exam: General - Patient is Alert and Oriented Extremity - Neurologically intact Sensation intact distally Intact pulses distally Dorsiflexion/Plantar flexion intact Dressing/Incision - Mild blood spotting at the distal dressing. May change to new aquacel dressing if needed. Otherwise clean and intact.  Motor Function - intact, moving foot and toes well on exam.   Past Medical History:  Diagnosis Date  . Arthritis   . Basal cell carcinoma (BCC) of head   . Basal cell carcinoma, ear    Left ear  . Glaucoma    Left eye    Assessment/Plan: 4 Days Post-Op Procedure(s) (LRB): TOTAL KNEE  ARTHROPLASTY (Right) Principal Problem:   Arterial ischemic stroke, multifocal, multiple vascular territories, acute (HCC) Active Problems:   OA (osteoarthritis) of knee   Primary osteoarthritis of right knee   Discoordination   Primary osteoarthritis of knee  Estimated body mass index is 26.3 kg/m as calculated from the following:   Height as of this encounter: 5\' 11"  (1.803 m).   Weight as of this encounter: 85.5 kg. Advance diet Up with therapy  DVT Prophylaxis - Plavix and aspirin Weight-bearing as tolerated  Mr. Woodcox is doing well this morning. We will order Protonix to help with his reflux symptoms. Continue working with PT. Plan for discharge to CIR when placement obtained and medically stable.   Griffith Citron, PA-C Orthopedic Surgery [phone] 10/04/2020, 8:33 AM

## 2020-10-04 NOTE — Progress Notes (Signed)
PROGRESS NOTE  Justin Rose  DOB: 1934/08/24  PCP: Karlene Einstein, MD FFM:[id number]  DOA: 09/30/2020  LOS: 3 days   Chief complaint: Stroke after TKA  Brief narrative: Justin Rose is a 86 y.o. male with no significant cardiovascular problem who underwent elective right total knee on 11/15.    Later that night, patient complained of frontal headache, some visual disturbances like he was seeing squiggles but no visual field loss.  Also developed incoordination of hand movements as he was not able to dial his cell phone or answer it.  He felt tremulous. Rapid response called at night, stat CT scan head was normal.  Neurology was consulted.  With no significant neurological deficit, was not intervention candidate.  Subsequent MRI showed multifocal embolic stroke. TRH consulted after rapid response on 11/15 midnight.  Patient was transferred to was low to [facility] for stroke work-up.  Subjective: Patient was seen and examined this morning.  Working with physical therapy.   Not in distress.  No complaint.  Pending CIR placement.   Per RN, he required in and out catheterization one time last night.  Assessment/Plan: Acute multiple territory ischemic stroke -suspect embolic related to long bone fracture.   -Did not get TPA because of minimal or no neurological deficits -MRI with numerous small scattered acute cortical and subcortical infarcts within the bilateral frontal, parietal and occipital lobes. CTA head and neck did not show any significant carotid or vertebral artery stenosis. -LDL 74, A1c 5.6. -Unclear etiology of stroke, likely embolic stroke of undetermined origin. -Patient did not want implantable loop recorder. Plan is to discharge him with 30-day cardiac event monitor -TTE with EF 60 to 65%, normal LV function, no atrial level shunt and no intracardiac source. -PT/OT evaluation obtained.  CIR recommended.   -Per neurology recommendation, patient is on aspirin 81 mg  daily and Plavix 75 mg daily for 3 weeks afterwards he will be on aspirin monotherapy. -Continue Lipitor 20 mg daily.  Osteoarthritis status post right total knee:  -Postop management as per surgery.   -Currently adequate pain management and therapies as above. -PT following.  CIR recommended.  Acute urinary retention -required in and out cath once last night.  -Based on his age, probably has underlying BPH, obtain renal ultrasound.  Start on Flomax.  Mobility: PT eval appreciated Code Status:   Code Status: Full Code  Nutritional status: Body mass index is 26.3 kg/m.     Diet Order            Diet regular Room service appropriate? Yes; Fluid consistency: Thin  Diet effective now                 DVT prophylaxis: SCDs Start: 09/30/20 1027 Place TED hose Start: 09/30/20 1027   Antimicrobials:  None Fluid: Okay to stop IV fluid. Consultants: Neurology, orthopedics Family Communication:  Family not at bedside  Status is: Inpatient  Remains inpatient appropriate because: Pending CIR placement   Dispo: The patient is from: Home              Anticipated d/c is to: CIR              Anticipated d/c date is: Whenever bed is available              Patient currently is medically stable to d/c.    Infusions:  . methocarbamol (ROBAXIN) IV      Scheduled Meds: . aspirin  81 mg Oral Daily  .  atorvastatin  20 mg Oral Daily  . brimonidine  1 drop Left Eye Daily   And  . timolol  1 drop Left Eye Daily  . clopidogrel  75 mg Oral Daily  . docusate sodium  100 mg Oral BID  . ipratropium  2 spray Each Nare Daily  . loratadine  10 mg Oral Daily  . metoprolol tartrate  50 mg Oral Once  . pantoprazole  40 mg Oral Daily    Antimicrobials: Anti-infectives (From admission, onward)   Start     Dose/Rate Route Frequency Ordered Stop   09/30/20 1400  ceFAZolin (ANCEF) IVPB 2g/100 mL premix        2 g 200 mL/hr over 30 Minutes Intravenous Every 6 hours 09/30/20 1026 09/30/20  2232   09/30/20 0600  ceFAZolin (ANCEF) IVPB 2g/100 mL premix        2 g 200 mL/hr over 30 Minutes Intravenous On call to O.R. 09/30/20 0530 09/30/20 0714      PRN meds: acetaminophen, bisacodyl, diphenhydrAMINE, lip balm, menthol-cetylpyridinium **OR** phenol, methocarbamol **OR** methocarbamol (ROBAXIN) IV, metoCLOPramide **OR** metoCLOPramide (REGLAN) injection, morphine injection, ondansetron **OR** ondansetron (ZOFRAN) IV, oxyCODONE, polyethylene glycol, sodium phosphate, traMADol   Objective: Vitals:   10/04/20 0800 10/04/20 1157  BP: 116/79 120/83  Pulse: 94 96  Resp: 18 16  Temp: 98.3 F (36.8 C) 99.1 F (37.3 C)  SpO2: 95% 98%    Intake/Output Summary (Last 24 hours) at 10/04/2020 1340 Last data filed at 10/04/2020 0421 Gross per 24 hour  Intake --  Output 750 ml  Net -750 ml   Filed Weights   09/30/20 0543  Weight: 85.5 kg   Weight change:  Body mass index is 26.3 kg/m.   Physical Exam: General exam: Appears calm and comfortable.  Participating with physical therapy with difficulty Skin: No rashes, lesions or ulcers. HEENT: Atraumatic, normocephalic, supple neck, no obvious bleeding Lungs: Clear to auscultation bilaterally CVS: Regular rate and rhythm, no murmur GI/Abd soft, nontender, nondistended, bowel sound present CNS: , Alert, awake, oriented x3 Psychiatry: Frustrated because of not having insurance authorization for CIR. Extremities: No pedal edema, no calf tenderness  Data Review: I have personally reviewed the laboratory data and studies available.  Recent Labs  Lab 10/01/20 0113 10/03/20 0256  WBC 15.7* 10.7*  HGB 11.5* 10.1*  HCT 35.1* 30.4*  MCV 94.6 95.9  PLT 203 168   Recent Labs  Lab 10/01/20 0113 10/03/20 0256  NA 137 140  K 4.1 4.0  CL 105 105  CO2 20* 25  GLUCOSE 173* 136*  BUN 16 17  CREATININE 0.81 0.69  CALCIUM 9.0 8.9    F/u labs ordered  Signed, Terrilee Croak, MD [facility] 10/04/2020

## 2020-10-04 NOTE — Progress Notes (Signed)
Inpatient Rehabilitation-Admissions Coordinator   Was notified by pt's insurance company that his request for CIR has been denied. AC will notify pt and TOC team of need for new dispo plan.   AC will sign off.   Raechel Ache, OTR/L  Rehab Admissions Coordinator  [phone] 10/04/2020 1:34 PM

## 2020-10-04 NOTE — Care Management Important Message (Signed)
Important Message  Patient Details  Name: Justin Rose MRN: [id number] Date of Birth: [birth year]   Medicare Important Message Given:  Yes     Lashonna Rieke [address] Circle 10/04/2020, 3:29 PM

## 2020-10-05 ENCOUNTER — Inpatient Hospital Stay (HOSPITAL_COMMUNITY): Payer: Medicare HMO

## 2020-10-05 DIAGNOSIS — R279 Unspecified lack of coordination: Secondary | ICD-10-CM | POA: Diagnosis not present

## 2020-10-05 LAB — URINALYSIS, ROUTINE W REFLEX MICROSCOPIC
Bilirubin Urine: NEGATIVE
Glucose, UA: NEGATIVE mg/dL
Hgb urine dipstick: NEGATIVE
Ketones, ur: 5 mg/dL — AB
Nitrite: NEGATIVE
Protein, ur: >=300 mg/dL — AB
Specific Gravity, Urine: 1.015 (ref 1.005–1.030)
pH: 9 — ABNORMAL HIGH (ref 5.0–8.0)

## 2020-10-05 MED ORDER — LIDOCAINE HCL URETHRAL/MUCOSAL 2 % EX GEL
1.0000 "application " | Freq: Once | CUTANEOUS | Status: DC
  Filled 2020-10-05: qty 11

## 2020-10-05 MED ORDER — SODIUM CHLORIDE 0.9 % IV SOLN
1.0000 g | INTRAVENOUS | Status: DC
  Administered 2020-10-05 – 2020-10-08 (×4): 1 g via INTRAVENOUS
  Filled 2020-10-05 (×4): qty 10

## 2020-10-05 NOTE — Progress Notes (Signed)
Pt c/o pain 8 out of 10 on urination with burning sensation. Temp 99.1, axillary. Urine sample sent to lab per MD order. In and out catheterization attempt X 3 unsuccessful. Pt voiding 60 mL with post residual bladder scan reveals 424 mL at this time. On call notified.

## 2020-10-05 NOTE — Progress Notes (Signed)
Physical Therapy Treatment Patient Details Name: Justin Rose MRN: [id number] DOB: 07/23/1934 Today's Date: 10/05/2020    History of Present Illness Pt is 86 yo male s/p R TKA 11/215/21. Post op events/symptoms documented MRI(+) Numerous small scattered acute cortical and subcortical infarcts within the bilateral frontal, parietal and occipital lobes. Findings are likely secondary to an embolic process.    PT Comments    Pt limited by R knee and R flank pain. Pt perseverating over pain in R flank which he reports began yesterday with difficulty urinating. Pt continues to requires significant physical assistance to perform all functional mobility. Pt with increased time for all mobility tasks and requires frequent PT cues for DME management and transfer sequencing. Pt will benefit from continued acute PT POC to improve LE strength and transfer quality. PT recommends SNF placement a this time as the pt was denied CIR by insurance.   Follow Up Recommendations  SNF;Supervision/Assistance - 24 hour (denied CIR)     Equipment Recommendations  Rolling walker with 5" wheels;3in1 (PT);Other (comment)    Recommendations for Other Services       Precautions / Restrictions Precautions Precautions: Fall Required Braces or Orthoses: Knee Immobilizer - Right Knee Immobilizer - Right: Discontinue once straight leg raise with < 10 degree lag Restrictions Weight Bearing Restrictions: No RLE Weight Bearing: Weight bearing as tolerated    Mobility  Bed Mobility Overal bed mobility: Needs Assistance Bed Mobility: Supine to Sit     Supine to sit: Mod assist;HOB elevated     General bed mobility comments: assist to rotate hips and cues for technique  Transfers Overall transfer level: Needs assistance Equipment used: Rolling walker (2 wheeled) Transfers: Sit to/from Stand Sit to Stand: Mod assist;From elevated surface Stand pivot transfers: Mod assist       General transfer comment: pt  requires verbal and tactile cues for transfer sequencing and DME management. Physical assistance to power up, prolonged time with increased hip flexion  Ambulation/Gait Ambulation/Gait assistance: Mod assist Gait Distance (Feet): 2 Feet Assistive device: Rolling walker (2 wheeled) Gait Pattern/deviations: Shuffle Gait velocity: reduced Gait velocity interpretation: <1.31 ft/sec, indicative of household ambulator General Gait Details: shuffling steps from bed to recliner, no foot clearance bilaterally   Stairs             Wheelchair Mobility    Modified Rankin (Stroke Patients Only) Modified Rankin (Stroke Patients Only) Pre-Morbid Rankin Score: No symptoms Modified Rankin: Moderately severe disability     Balance Overall balance assessment: Needs assistance Sitting-balance support: Bilateral upper extremity supported Sitting balance-Leahy Scale: Poor Sitting balance - Comments: posterior of L lateral lean, minA or UE support dependent   Standing balance support: Bilateral upper extremity supported Standing balance-Leahy Scale: Poor Standing balance comment: modA initially, minA with increased time in standing                            Cognition Arousal/Alertness: Awake/alert Behavior During Therapy: WFL for tasks assessed/performed Overall Cognitive Status: Impaired/Different from baseline Area of Impairment: Safety/judgement;Awareness;Problem solving                         Safety/Judgement: Decreased awareness of safety Awareness: Emergent Problem Solving: Requires verbal cues;Requires tactile cues;Difficulty sequencing        Exercises Total Joint Exercises Ankle Circles/Pumps: AROM;Both;20 reps Goniometric ROM: R knee extension -10 active, -8 passive. R knee flexion 30 active, 35 passive  General Comments General comments (skin integrity, edema, etc.): pt limited by pain, reports lightheadedness upon completion of transfer which  improves once sitting      Pertinent Vitals/Pain Pain Assessment: Faces Faces Pain Scale: Hurts even more Pain Location: R knee and R flank Pain Descriptors / Indicators: Grimacing Pain Intervention(s): RN gave pain meds during session    Home Living                      Prior Function            PT Goals (current goals can now be found in the care plan section) Acute Rehab PT Goals Patient Stated Goal: to improve mobility Time For Goal Achievement: 10/19/20 Potential to Achieve Goals: Fair Progress towards PT goals: Not progressing toward goals - comment (limited by pain and weakness)    Frequency    7X/week      PT Plan Discharge plan needs to be updated    Co-evaluation              AM-PAC PT "6 Clicks" Mobility   Outcome Measure  Help needed turning from your back to your side while in a flat bed without using bedrails?: A Lot Help needed moving from lying on your back to sitting on the side of a flat bed without using bedrails?: A Lot Help needed moving to and from a bed to a chair (including a wheelchair)?: A Lot Help needed standing up from a chair using your arms (e.g., wheelchair or bedside chair)?: A Lot Help needed to walk in hospital room?: A Lot Help needed climbing 3-5 steps with a railing? : Total 6 Click Score: 11    End of Session Equipment Utilized During Treatment: Gait belt;Right knee immobilizer Activity Tolerance: Patient limited by pain Patient left: in chair;with call bell/phone within reach;with chair alarm set Nurse Communication: Mobility status PT Visit Diagnosis: Muscle weakness (generalized) (M62.81);Difficulty in walking, not elsewhere classified (R26.2);Pain;Other symptoms and signs involving the nervous system (R29.898);Unsteadiness on feet (R26.81);Other abnormalities of gait and mobility (R26.89) Pain - Right/Left: Right Pain - part of body: Knee     Time: 1140-1220 PT Time Calculation (min) (ACUTE ONLY): 40  min  Charges:  $Therapeutic Activity: 38-52 mins                     Zenaida Niece, PT, DPT Acute Rehabilitation Pager: [phone]    Zenaida Niece 10/05/2020, 2:02 PM

## 2020-10-05 NOTE — Progress Notes (Signed)
PROGRESS NOTE  Justin Rose  DOB: 04/24/1934  PCP: Karlene Einstein, MD HYW:[id number]  DOA: 09/30/2020  LOS: 4 days   Chief complaint: Stroke after TKA  Brief narrative: Justin Rose is a 86 y.o. male with no significant cardiovascular problem who underwent elective right total knee on 11/15.    Later that night, patient complained of frontal headache, some visual disturbances like he was seeing squiggles but no visual field loss.  Also developed incoordination of hand movements as he was not able to dial his cell phone or answer it.  He felt tremulous. Rapid response called at night, stat CT scan head was normal.  Neurology was consulted.  With no significant neurological deficit, was not intervention candidate.  Subsequent MRI showed multifocal embolic stroke. TRH consulted after rapid response on 11/15 midnight.  Patient was transferred to was low to [facility] for stroke work-up.  Subjective: Patient was seen and examined this morning.  Lying on bed.  Not in distress.  Looks weak to me compared to other days. Event from last 24 hours noted.  Patient had urinary retention and burning, required coud catheter placement.  Assessment/Plan: Acute multiple territory ischemic stroke -suspect embolic related to long bone fracture.   -Did not get TPA because of minimal or no neurological deficits -MRI with numerous small scattered acute cortical and subcortical infarcts within the bilateral frontal, parietal and occipital lobes. CTA head and neck did not show any significant carotid or vertebral artery stenosis. -LDL 74, A1c 5.6. -Unclear etiology of stroke, likely embolic stroke of undetermined origin. -Patient did not want implantable loop recorder. Plan is to discharge him with 30-day cardiac event monitor -TTE with EF 60 to 65%, normal LV function, no atrial level shunt and no intracardiac source. -PT/OT evaluation obtained.  CIR recommended.   -Per neurology recommendation,  patient is on aspirin 81 mg daily and Plavix 75 mg daily for 3 weeks afterwards he will be on aspirin monotherapy. -Continue Lipitor 20 mg daily.  Osteoarthritis status post right total knee:  -Postop management as per surgery.   -Currently adequate pain management and therapies as above. -PT following.  CIR denied by insurance.  Family has appealed.  UTI Acute urinary retention -11/19, complaint of burning urination.  Was found to have urinary retention.   -Coud catheter was placed.   -I also started him on IV Rocephin for 3 to 5-day course.   -Patient denies history of BPH but says he drinks little water at nighttime to avoid nocturia.  Based on his age, he probably has underlying BPH.  I have started him on Flomax.  Mobility: PT eval appreciated Code Status:   Code Status: Full Code  Nutritional status: Body mass index is 26.3 kg/m.     Diet Order            Diet regular Room service appropriate? Yes; Fluid consistency: Thin  Diet effective now                 DVT prophylaxis: SCDs Start: 09/30/20 1027 Place TED hose Start: 09/30/20 1027   Antimicrobials:  None Fluid: Not on IV fluid Family Communication:  Wife at bedside  Status is: Inpatient  Remains inpatient appropriate because: Weak.  CIR denied by insurance.  Family appealed.   Dispo: The patient is from: Home              Anticipated d/c is to: CIR versus home  Anticipated d/c date is: Unclear at this time.              Patient currently is medically stable to d/c.    Infusions:  . cefTRIAXone (ROCEPHIN)  IV 1 g (10/05/20 0809)  . methocarbamol (ROBAXIN) IV      Scheduled Meds: . aspirin  81 mg Oral Daily  . atorvastatin  20 mg Oral Daily  . brimonidine  1 drop Left Eye Daily   And  . timolol  1 drop Left Eye Daily  . clopidogrel  75 mg Oral Daily  . ipratropium  2 spray Each Nare Daily  . lidocaine  1 application Urethral Once  . loratadine  10 mg Oral Daily  . metoprolol  tartrate  50 mg Oral Once  . pantoprazole  40 mg Oral Daily  . psyllium  1 packet Oral Daily  . senna-docusate  1 tablet Oral QHS  . tamsulosin  0.4 mg Oral Daily    Antimicrobials: Anti-infectives (From admission, onward)   Start     Dose/Rate Route Frequency Ordered Stop   10/05/20 0800  cefTRIAXone (ROCEPHIN) 1 g in sodium chloride 0.9 % 100 mL IVPB        1 g 200 mL/hr over 30 Minutes Intravenous Every 24 hours 10/05/20 0727     09/30/20 1400  ceFAZolin (ANCEF) IVPB 2g/100 mL premix        2 g 200 mL/hr over 30 Minutes Intravenous Every 6 hours 09/30/20 1026 09/30/20 2232   09/30/20 0600  ceFAZolin (ANCEF) IVPB 2g/100 mL premix        2 g 200 mL/hr over 30 Minutes Intravenous On call to O.R. 09/30/20 0530 09/30/20 0714      PRN meds: acetaminophen, bisacodyl, diphenhydrAMINE, lip balm, menthol-cetylpyridinium **OR** phenol, methocarbamol **OR** methocarbamol (ROBAXIN) IV, metoCLOPramide **OR** metoCLOPramide (REGLAN) injection, morphine injection, ondansetron **OR** ondansetron (ZOFRAN) IV, oxyCODONE, polyethylene glycol, sodium phosphate, traMADol   Objective: Vitals:   10/05/20 0455 10/05/20 0745  BP: 121/82 113/78  Pulse: (!) 108 98  Resp:  18  Temp: 98.7 F (37.1 C) 99.3 F (37.4 C)  SpO2: 96% 95%    Intake/Output Summary (Last 24 hours) at 10/05/2020 1124 Last data filed at 10/05/2020 0200 Gross per 24 hour  Intake --  Output 700 ml  Net -700 ml   Filed Weights   09/30/20 0543  Weight: 85.5 kg   Weight change:  Body mass index is 26.3 kg/m.   Physical Exam: General exam: Appears calm and comfortable.  Participating with physical therapy with difficulty Skin: No rashes, lesions or ulcers. HEENT: Atraumatic, normocephalic, supple neck, no obvious bleeding Lungs: Clear to auscultation bilaterally CVS: Regular rate and rhythm, no murmur GI/Abd soft, nontender, nondistended, bowel sound present CNS: , Alert, awake, oriented x3 Psychiatry:  Frustrated. Extremities: No pedal edema, no calf tenderness  Data Review: I have personally reviewed the laboratory data and studies available.  Recent Labs  Lab 10/01/20 0113 10/03/20 0256  WBC 15.7* 10.7*  HGB 11.5* 10.1*  HCT 35.1* 30.4*  MCV 94.6 95.9  PLT 203 168   Recent Labs  Lab 10/01/20 0113 10/03/20 0256  NA 137 140  K 4.1 4.0  CL 105 105  CO2 20* 25  GLUCOSE 173* 136*  BUN 16 17  CREATININE 0.81 0.69  CALCIUM 9.0 8.9    F/u labs ordered  Signed, Terrilee Croak, MD [facility] 10/05/2020

## 2020-10-05 NOTE — Progress Notes (Signed)
37fr. Coude cath placed per orders. 50cc's of urine in urinal before placing cath. C/O burning when attempting to void. Urine return after cath placed, +/- 400cc's. Patient tolerated with minimal discomfort. Bed alarm set, call light within reach, bedside table within patient's reach. Justin Rose A

## 2020-10-05 NOTE — Progress Notes (Signed)
    Subjective:  Patient reports pain as mild to moderate.  Denies N/V/CP/SOB. CIR admit was denied. Foley placed for urinary retention.  Objective:   VITALS:   Vitals:   10/04/20 2044 10/05/20 0001 10/05/20 0455 10/05/20 0745  BP: 135/89 128/83 121/82 113/78  Pulse: 100 (!) 101 (!) 108 98  Resp: 17   18  Temp: 97.8 F (36.6 C) 99.1 F (37.3 C) 98.7 F (37.1 C) 99.3 F (37.4 C)  TempSrc: Oral Oral Oral Oral  SpO2: 96% 97% 96% 95%  Weight:      Height:        NAD ABD soft Sensation intact distally Intact pulses distally Dorsiflexion/Plantar flexion intact Incision: scant drainage Compartment soft   Lab Results  Component Value Date   WBC 10.7 (H) 10/03/2020   HGB 10.1 (L) 10/03/2020   HCT 30.4 (L) 10/03/2020   MCV 95.9 10/03/2020   PLT 168 10/03/2020   BMET    Component Value Date/Time   NA 140 10/03/2020 0256   K 4.0 10/03/2020 0256   CL 105 10/03/2020 0256   CO2 25 10/03/2020 0256   GLUCOSE 136 (H) 10/03/2020 0256   BUN 17 10/03/2020 0256   CREATININE 0.69 10/03/2020 0256   CALCIUM 8.9 10/03/2020 0256   GFRNONAA >60 10/03/2020 0256     Assessment/Plan: 5 Days Post-Op   Principal Problem:   Arterial ischemic stroke, multifocal, multiple vascular territories, acute (HCC) Active Problems:   OA (osteoarthritis) of knee   Primary osteoarthritis of right knee   Discoordination   Primary osteoarthritis of knee   WBAT with walker DVT ppx: Aspirin, SCDs, TEDS PO pain control PT/OT Dispo: leave foley for 24 hrs, cont PT until able to d/c home   [place] 10/05/2020, 8:43 AM   Rod Can, MD [phone] [place] [facility] now [facility]  [facility] [address], [place], [place] [address] Phone: [phone] [email or web address] Facebook  [employer]

## 2020-10-06 DIAGNOSIS — R279 Unspecified lack of coordination: Secondary | ICD-10-CM | POA: Diagnosis not present

## 2020-10-06 LAB — HEPATIC FUNCTION PANEL
ALT: 43 U/L (ref 0–44)
AST: 62 U/L — ABNORMAL HIGH (ref 15–41)
Albumin: 2.5 g/dL — ABNORMAL LOW (ref 3.5–5.0)
Alkaline Phosphatase: 60 U/L (ref 38–126)
Bilirubin, Direct: 0.3 mg/dL — ABNORMAL HIGH (ref 0.0–0.2)
Indirect Bilirubin: 0.8 mg/dL (ref 0.3–0.9)
Total Bilirubin: 1.1 mg/dL (ref 0.3–1.2)
Total Protein: 5.8 g/dL — ABNORMAL LOW (ref 6.5–8.1)

## 2020-10-06 LAB — CBC WITH DIFFERENTIAL/PLATELET
Abs Immature Granulocytes: 0.07 10*3/uL (ref 0.00–0.07)
Basophils Absolute: 0 10*3/uL (ref 0.0–0.1)
Basophils Relative: 0 %
Eosinophils Absolute: 0.3 10*3/uL (ref 0.0–0.5)
Eosinophils Relative: 3 %
HCT: 28.1 % — ABNORMAL LOW (ref 39.0–52.0)
Hemoglobin: 9.4 g/dL — ABNORMAL LOW (ref 13.0–17.0)
Immature Granulocytes: 1 %
Lymphocytes Relative: 11 %
Lymphs Abs: 1 10*3/uL (ref 0.7–4.0)
MCH: 31.4 pg (ref 26.0–34.0)
MCHC: 33.5 g/dL (ref 30.0–36.0)
MCV: 94 fL (ref 80.0–100.0)
Monocytes Absolute: 1 10*3/uL (ref 0.1–1.0)
Monocytes Relative: 10 %
Neutro Abs: 7.2 10*3/uL (ref 1.7–7.7)
Neutrophils Relative %: 75 %
Platelets: 247 10*3/uL (ref 150–400)
RBC: 2.99 MIL/uL — ABNORMAL LOW (ref 4.22–5.81)
RDW: 13 % (ref 11.5–15.5)
WBC: 9.6 10*3/uL (ref 4.0–10.5)
nRBC: 0 % (ref 0.0–0.2)

## 2020-10-06 LAB — BASIC METABOLIC PANEL
Anion gap: 10 (ref 5–15)
BUN: 20 mg/dL (ref 8–23)
CO2: 25 mmol/L (ref 22–32)
Calcium: 8.4 mg/dL — ABNORMAL LOW (ref 8.9–10.3)
Chloride: 101 mmol/L (ref 98–111)
Creatinine, Ser: 0.72 mg/dL (ref 0.61–1.24)
GFR, Estimated: >60 mL/min (ref >60)
Glucose, Bld: 129 mg/dL — ABNORMAL HIGH (ref 70–99)
Potassium: 3.6 mmol/L (ref 3.5–5.1)
Sodium: 136 mmol/L (ref 135–145)

## 2020-10-06 LAB — PHOSPHORUS: Phosphorus: 3.2 mg/dL (ref 2.5–4.6)

## 2020-10-06 LAB — LACTATE DEHYDROGENASE: LDH: 198 U/L — ABNORMAL HIGH (ref 98–192)

## 2020-10-06 LAB — MAGNESIUM: Magnesium: 2.1 mg/dL (ref 1.7–2.4)

## 2020-10-06 LAB — CK: Total CK: 990 U/L — ABNORMAL HIGH (ref 49–397)

## 2020-10-06 MED ORDER — TIMOLOL MALEATE 0.5 % OP SOLN: 1.0000 [drp] | Freq: Every day | OPHTHALMIC | Status: DC

## 2020-10-06 MED ORDER — BRIMONIDINE TARTRATE 0.2 % OP SOLN: 1.0000 [drp] | Freq: Every day | OPHTHALMIC | Status: DC

## 2020-10-06 MED ORDER — BRIMONIDINE TARTRATE 0.2 % OP SOLN
1.0000 [drp] | Freq: Every day | OPHTHALMIC | Status: DC
  Administered 2020-10-06 – 2020-10-08 (×3): 1 [drp] via OPHTHALMIC

## 2020-10-06 MED ORDER — POLYETHYLENE GLYCOL 3350 17 G PO PACK
17.0000 g | PACK | Freq: Two times a day (BID) | ORAL | Status: DC
Start: 2020-10-06 — End: 2020-10-09
  Administered 2020-10-06 – 2020-10-08 (×4): 17 g via ORAL
  Filled 2020-10-06 (×6): qty 1

## 2020-10-06 MED ORDER — SODIUM CHLORIDE 0.9 % IV SOLN: INTRAVENOUS | Status: DC

## 2020-10-06 NOTE — Progress Notes (Signed)
Physical Therapy Treatment Patient Details Name: Justin Rose MRN: [id number] DOB: 11/26/1933 Today's Date: 10/06/2020    History of Present Illness Pt is 86 yo male s/p R TKA 11/215/21. Post op events/symptoms documented MRI(+) Numerous small scattered acute cortical and subcortical infarcts within the bilateral frontal, parietal and occipital lobes. Findings are likely secondary to an embolic process.    PT Comments    Pt limited by joint pain in R knee, L leg, and bilateral hands. Pt will benefit from pre-medication prior to PT sessions. Pt continues to require physical assistance for all functional mobility tasks. Pt with difficulty extending hips and knees, resulting in impaired balance at this time. Pt demonstrates reduced activity tolerance due to pain in multiple joints. Pt will benefit from continued acute PT services to improve mobility quality and reduce falls risk. PT attempts to encourage AROM of LLE to improve mobility and reduce joint stiffness. PT recommends CIR placement at this time.   Follow Up Recommendations  CIR     Equipment Recommendations  Rolling walker with 5" wheels;3in1 (PT);Wheelchair (measurements PT)    Recommendations for Other Services       Precautions / Restrictions Precautions Precautions: Fall Required Braces or Orthoses: Knee Immobilizer - Right Knee Immobilizer - Right: Discontinue once straight leg raise with < 10 degree lag Restrictions Weight Bearing Restrictions: No    Mobility  Bed Mobility                  Transfers Overall transfer level: Needs assistance Equipment used: Rolling walker (2 wheeled) Transfers: Sit to/from [facility] Sit to Stand: Mod assist;Max assist Stand pivot transfers: Mod assist       General transfer comment: modA with initial transfer, maxA from bedside commode, pt continues to have difficulty extending L knee and bilateral hips, often with posterior lean requiring physical  assistance to correct  Ambulation/Gait Ambulation/Gait assistance: Min assist Gait Distance (Feet): 5 Feet Assistive device: Rolling walker (2 wheeled) Gait Pattern/deviations: Step-to pattern Gait velocity: reduced Gait velocity interpretation: <1.31 ft/sec, indicative of household ambulator General Gait Details: pt with short step-to gait, reduced foot clearance bilaterally   Stairs             Wheelchair Mobility    Modified Rankin (Stroke Patients Only) Modified Rankin (Stroke Patients Only) Pre-Morbid Rankin Score: No symptoms Modified Rankin: Moderately severe disability     Balance Overall balance assessment: Needs assistance Sitting-balance support: Single extremity supported;Bilateral upper extremity supported Sitting balance-Leahy Scale: Poor Sitting balance - Comments: reliant on UE support, likely to offload R hips with lateral lean   Standing balance support: Bilateral upper extremity supported Standing balance-Leahy Scale: Poor Standing balance comment: modA with BUE support of RW                            Cognition Arousal/Alertness: Awake/alert Behavior During Therapy: WFL for tasks assessed/performed Overall Cognitive Status: Impaired/Different from baseline Area of Impairment: Problem solving                             Problem Solving: Difficulty sequencing        Exercises      General Comments General comments (skin integrity, edema, etc.): pt reports lightheadedness after standing twice during session. PT records BP after transfer from [facility] to recliner, BP of 121/76, pt denies lightheadedness      Pertinent Vitals/Pain Pain Assessment:  Faces Faces Pain Scale: Hurts even more Pain Location: hands, RLE Pain Descriptors / Indicators: Aching Pain Intervention(s): Premedicated before session    Home Living                      Prior Function            PT Goals (current goals can now be found in  the care plan section) Acute Rehab PT Goals Patient Stated Goal: to improve mobility Progress towards PT goals: Not progressing toward goals - comment (limited by joint pain and stiffness, hands, bilateral LE)    Frequency    7X/week      PT Plan Current plan remains appropriate    Co-evaluation              AM-PAC PT "6 Clicks" Mobility   Outcome Measure  Help needed turning from your back to your side while in a flat bed without using bedrails?: A Little Help needed moving from lying on your back to sitting on the side of a flat bed without using bedrails?: A Little Help needed moving to and from a bed to a chair (including a wheelchair)?: A Lot Help needed standing up from a chair using your arms (e.g., wheelchair or bedside chair)?: A Lot Help needed to walk in hospital room?: A Little Help needed climbing 3-5 steps with a railing? : Total 6 Click Score: 14    End of Session Equipment Utilized During Treatment: Gait belt;Right knee immobilizer Activity Tolerance: Patient limited by pain Patient left: in chair;with call bell/phone within reach;with chair alarm set Nurse Communication: Mobility status PT Visit Diagnosis: Muscle weakness (generalized) (M62.81);Difficulty in walking, not elsewhere classified (R26.2);Pain;Other symptoms and signs involving the nervous system (R29.898);Unsteadiness on feet (R26.81);Other abnormalities of gait and mobility (R26.89) Pain - Right/Left: Right Pain - part of body: Knee     Time: 1062-6948 PT Time Calculation (min) (ACUTE ONLY): 42 min  Charges:  $Therapeutic Activity: 38-52 mins                     Zenaida Niece, PT, DPT Acute Rehabilitation Pager: [phone]    Zenaida Niece 10/06/2020, 5:36 PM

## 2020-10-06 NOTE — Plan of Care (Signed)
  Problem: Education: Goal: Knowledge of the prescribed therapeutic regimen will improve Outcome: Progressing Goal: Individualized Educational Video(s) Outcome: Progressing   Problem: Activity: Goal: Range of joint motion will improve Outcome: Progressing   Problem: Clinical Measurements: Goal: Postoperative complications will be avoided or minimized Outcome: Progressing   Problem: Pain Management: Goal: Pain level will decrease with appropriate interventions Outcome: Progressing   Problem: Skin Integrity: Goal: Will show signs of wound healing Outcome: Progressing   Problem: Education: Goal: Knowledge of General Education information will improve Description: Including pain rating scale, medication(s)/side effects and non-pharmacologic comfort measures Outcome: Progressing   Problem: Health Behavior/Discharge Planning: Goal: Ability to manage health-related needs will improve Outcome: Progressing   Problem: Clinical Measurements: Goal: Ability to maintain clinical measurements within normal limits will improve Outcome: Progressing Goal: Will remain free from infection Outcome: Progressing

## 2020-10-06 NOTE — Progress Notes (Signed)
PROGRESS NOTE  Justin Rose  DOB: 08/22/1934  PCP: Karlene Einstein, MD QJF:[id number]  DOA: 09/30/2020  LOS: 5 days   Chief complaint: Stroke after TKA  Brief narrative: Justin Rose is a 86 y.o. male with history of arthritis, no significant cardiovascular issue who underwent elective right total knee on 11/15.    Later that night, patient complained of frontal headache, some visual disturbances like he was seeing squiggles but no visual field loss.  Also developed incoordination of hand movements as he was not able to dial his cell phone or answer it.  He felt tremulous. Rapid response called at night, stat CT scan head was normal.  Neurology was consulted.  With no significant neurological deficit, was not intervention candidate.  Subsequent MRI showed multifocal embolic stroke. TRH consulted after rapid response on 11/15 midnight.  Patient was transferred to was low to [facility] for stroke work-up. Currently waiting for CIR placement.  Subjective: Patient was seen and examined this morning.  Propped up in bed. Complains of soreness everywhere, thinks it is related to his arthritis. Constipated  Assessment/Plan: Acute multiple territory ischemic stroke -suspect embolic related to long bone fracture.   -Did not get TPA because of minimal or no neurological deficits -MRI with numerous small scattered acute cortical and subcortical infarcts within the bilateral frontal, parietal and occipital lobes. CTA head and neck did not show any significant carotid or vertebral artery stenosis. -LDL 74, A1c 5.6. -Unclear etiology of stroke, likely embolic stroke of undetermined origin. -Patient did not want implantable loop recorder. Plan is to discharge him with 30-day cardiac event monitor -TTE with EF 60 to 65%, normal LV function, no atrial level shunt and no intracardiac source. -PT/OT evaluation obtained.  CIR recommended.   -Per neurology recommendation, patient is on aspirin 81 mg  daily and Plavix 75 mg daily for 3 weeks afterwards he will be on aspirin monotherapy. -Currently on Lipitor 20 mg daily.  We will hold it because of concern of statin induced myositis.  Osteoarthritis status post right total knee:  -Postop management as per surgery.   -Currently adequate pain management and therapies as above. -PT following.  CIR denied by insurance.  Family has appealed.  Patient states that it has not been approved.  ?  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 -CK level elevated at 990, LDH elevated to 198. -Stop Lipitor.  Start on IV fluids with normal saline at 75 mill per hour. -Obtain liver function panel. Recent Labs  Lab 10/06/20 0819  CKTOTAL 990*   UTI Acute urinary retention -11/19, complaint of burning urination.  Was found to have urinary retention.   -Coud catheter was placed.   -I also started him on IV Rocephin for 3 to 5-day course.   -Patient denies history of BPH but says he drinks little water at nighttime to avoid nocturia.  Based on his age, he probably has underlying BPH.  Currently on Flomax.  Mobility: PT eval appreciated Code Status:   Code Status: Full Code  Nutritional status: Body mass index is 26.3 kg/m.     Diet Order            Diet regular Room service appropriate? Yes; Fluid consistency: Thin  Diet effective now                 DVT prophylaxis: SCDs Start: 09/30/20 1027 Place TED hose Start: 09/30/20 1027   Antimicrobials:  None Fluid: Not on  IV fluid Family Communication:  Wife at bedside  Status is: Inpatient  Remains inpatient appropriate because: Weak.  Generalized pain, may have statin induced myositis   Dispo: The patient is from: Home              Anticipated d/c is to: CIR versus home              Anticipated d/c date is: Unclear at this time.              Patient currently is medically stable to d/c.    Infusions:  . sodium  chloride    . cefTRIAXone (ROCEPHIN)  IV 1 g (10/06/20 1010)  . methocarbamol (ROBAXIN) IV      Scheduled Meds: . aspirin  81 mg Oral Daily  . brimonidine  1 drop Left Eye QHS   And  . timolol  1 drop Left Eye QHS  . clopidogrel  75 mg Oral Daily  . ipratropium  2 spray Each Nare Daily  . lidocaine  1 application Urethral Once  . loratadine  10 mg Oral Daily  . metoprolol tartrate  50 mg Oral Once  . pantoprazole  40 mg Oral Daily  . polyethylene glycol  17 g Oral BID  . psyllium  1 packet Oral Daily  . senna-docusate  1 tablet Oral QHS  . tamsulosin  0.4 mg Oral Daily    Antimicrobials: Anti-infectives (From admission, onward)   Start     Dose/Rate Route Frequency Ordered Stop   10/05/20 0800  cefTRIAXone (ROCEPHIN) 1 g in sodium chloride 0.9 % 100 mL IVPB        1 g 200 mL/hr over 30 Minutes Intravenous Every 24 hours 10/05/20 0727     09/30/20 1400  ceFAZolin (ANCEF) IVPB 2g/100 mL premix        2 g 200 mL/hr over 30 Minutes Intravenous Every 6 hours 09/30/20 1026 09/30/20 2232   09/30/20 0600  ceFAZolin (ANCEF) IVPB 2g/100 mL premix        2 g 200 mL/hr over 30 Minutes Intravenous On call to O.R. 09/30/20 0530 09/30/20 0714      PRN meds: acetaminophen, bisacodyl, diphenhydrAMINE, lip balm, menthol-cetylpyridinium **OR** phenol, methocarbamol **OR** methocarbamol (ROBAXIN) IV, metoCLOPramide **OR** metoCLOPramide (REGLAN) injection, morphine injection, ondansetron **OR** ondansetron (ZOFRAN) IV, oxyCODONE, polyethylene glycol, sodium phosphate, traMADol   Objective: Vitals:   10/06/20 0413 10/06/20 0805  BP: 110/69 129/82  Pulse: 94 94  Resp: 20 20  Temp: 98.8 F (37.1 C) 98.7 F (37.1 C)  SpO2: 95% 96%    Intake/Output Summary (Last 24 hours) at 10/06/2020 1152 Last data filed at 10/06/2020 3790 Gross per 24 hour  Intake 30 ml  Output 2400 ml  Net -2370 ml   Filed Weights   09/30/20 0543  Weight: 85.5 kg   Weight change:  Body mass index is 26.3  kg/m.   Physical Exam: General exam: Mild to moderate distress because of generalized pain Skin: No rashes, lesions or ulcers. HEENT: Atraumatic, normocephalic, supple neck, no obvious bleeding Lungs: Clear to auscultation bilaterally CVS: Regular rate and rhythm, no murmur GI/Abd soft, nontender, nondistended, bowel sound present CNS: , Alert, awake, oriented x3 Psychiatry: Mood appropriate. Extremities: No pedal edema, no calf tenderness  Data Review: I have personally reviewed the laboratory data and studies available.  Recent Labs  Lab 10/01/20 0113 10/03/20 0256 10/06/20 0819  WBC 15.7* 10.7* 9.6  NEUTROABS  --   --  7.2  HGB 11.5*  10.1* 9.4*  HCT 35.1* 30.4* 28.1*  MCV 94.6 95.9 94.0  PLT 203 168 247   Recent Labs  Lab 10/01/20 0113 10/03/20 0256 10/06/20 0819  NA 137 140 136  K 4.1 4.0 3.6  CL 105 105 101  CO2 20* 25 25  GLUCOSE 173* 136* 129*  BUN 16 17 20   CREATININE 0.81 0.69 0.72  CALCIUM 9.0 8.9 8.4*  MG  --   --  2.1  PHOS  --   --  3.2    F/u labs ordered  Signed, Terrilee Croak, MD [facility] 10/06/2020

## 2020-10-06 NOTE — Progress Notes (Signed)
Patient ID: Justin Rose, male   DOB: October 31, 1934, 86 y.o.   MRN: [id number] Subjective: 6 Days Post-Op Procedure(s) (LRB): TOTAL KNEE ARTHROPLASTY (Right)    Patient reports pain as moderate. A lot of generalized soreness.  Worried that Lipitor the source but does have history of bilateral knee OA, DDD, foot and ankle osteoarthritis. Reports no BM in a week (takes Miralax daily at home) Urinary retention postop - foley replaced  Objective:   VITALS:   Vitals:   10/06/20 0000 10/06/20 0413  BP: 112/85 110/69  Pulse: 100 94  Resp:  20  Temp: 98.9 F (37.2 C) 98.8 F (37.1 C)  SpO2: 97% 95%    Neurovascular intact Incision: dressing C/D/I  LABS No results for input(s): HGB, HCT, WBC, PLT in the last 72 hours.  No results for input(s): NA, K, BUN, CREATININE, GLUCOSE in the last 72 hours.  No results for input(s): LABPT, INR in the last 72 hours.   Assessment/Plan: 6 Days Post-Op Procedure(s) (LRB): TOTAL KNEE ARTHROPLASTY (Right)   Up with therapy  Encourage continued activity while in bed Consider CPM while in bed miralax ordered for BID Maintain foley until arrangements made with urologist

## 2020-10-06 NOTE — Progress Notes (Signed)
Physical Therapy Treatment Patient Details Name: Justin Rose MRN: [id number] DOB: 21-Jan-1934 Today's Date: 10/06/2020    History of Present Illness Pt is 86 yo male s/p R TKA 11/215/21. Post op events/symptoms documented MRI(+) Numerous small scattered acute cortical and subcortical infarcts within the bilateral frontal, parietal and occipital lobes. Findings are likely secondary to an embolic process.    PT Comments    Pt with improved activity tolerance this session, reports he took pain medicine this morning which has made him feel better. Pt demonstrates some improvement in transfer quality with cues for technique, although continues to require significant physical assistance. Pt is able to tolerate gait training this session and demonstrates improve LE strength bilaterally. Pt will benefit from CPM use once delivered by ortho tech. Pt will benefit from continued acute PT POC to improve mobility quality and to reduce falls risk. PT updates recommendations to CIR as pt now reports that insurance has approved CIR admission. Pt will benefit from high intensity inpatient therapies.   Follow Up Recommendations  CIR (per pt, spouse reported [employer] approved CIR placement)     Equipment Recommendations  Rolling walker with 5" wheels;3in1 (PT);Wheelchair (measurements PT)    Recommendations for Other Services       Precautions / Restrictions Precautions Precautions: Fall Required Braces or Orthoses: Knee Immobilizer - Right Knee Immobilizer - Right: Discontinue once straight leg raise with < 10 degree lag Restrictions Weight Bearing Restrictions: No    Mobility  Bed Mobility Overal bed mobility: Needs Assistance Bed Mobility: Supine to Sit     Supine to sit: Min assist     General bed mobility comments: minA to scoot forward, requires hand hold to pull forward  Transfers Overall transfer level: Needs assistance Equipment used: Rolling walker (2 wheeled) Transfers: Sit to/from  [facility] Sit to Stand: Mod assist;From elevated surface Stand pivot transfers: Mod assist;From elevated surface       General transfer comment: pt requires cues to rock forward to gain momentum, for foot placement prior to transfer initiation, and physical assistance to power up into standing  Ambulation/Gait Ambulation/Gait assistance: Min assist Gait Distance (Feet): 6 Feet Assistive device: Rolling walker (2 wheeled) Gait Pattern/deviations: Step-to pattern Gait velocity: reduced Gait velocity interpretation: <1.31 ft/sec, indicative of household ambulator General Gait Details: pt with short step-to gait, able to clear both feet but with reduced stance time on LLE. Increased trunk flexion   Stairs             Wheelchair Mobility    Modified Rankin (Stroke Patients Only) Modified Rankin (Stroke Patients Only) Pre-Morbid Rankin Score: No symptoms Modified Rankin: Moderately severe disability     Balance Overall balance assessment: Needs assistance Sitting-balance support: Feet supported;Single extremity supported Sitting balance-Leahy Scale: Poor Sitting balance - Comments: reliant on UE support due to posterior lean Postural control: Posterior lean Standing balance support: Bilateral upper extremity supported Standing balance-Leahy Scale: Poor Standing balance comment: reliant on BUE support and minA                            Cognition Arousal/Alertness: Awake/alert Behavior During Therapy: WFL for tasks assessed/performed Overall Cognitive Status: Impaired/Different from baseline Area of Impairment: Memory;Problem solving;Safety/judgement                     Memory: Decreased short-term memory Following Commands: Follows one step commands consistently Safety/Judgement: Decreased awareness of safety;Decreased awareness of deficits Awareness: Emergent  Problem Solving: Slow processing;Requires verbal cues         Exercises Total Joint Exercises Goniometric ROM: R knee extension -10 AROM, flexion 40 degrees AROM    General Comments General comments (skin integrity, edema, etc.): VSS, pt with improved activity tolerance, CPM machine not present. MD has placed order      Pertinent Vitals/Pain Pain Assessment: Faces Faces Pain Scale: Hurts even more Pain Location: R knee and low back Pain Descriptors / Indicators: Aching Pain Intervention(s): Monitored during session    Home Living                      Prior Function            PT Goals (current goals can now be found in the care plan section) Acute Rehab PT Goals Patient Stated Goal: to improve mobility Progress towards PT goals: Progressing toward goals (slowly)    Frequency    7X/week      PT Plan Discharge plan needs to be updated    Co-evaluation              AM-PAC PT "6 Clicks" Mobility   Outcome Measure  Help needed turning from your back to your side while in a flat bed without using bedrails?: A Little Help needed moving from lying on your back to sitting on the side of a flat bed without using bedrails?: A Little Help needed moving to and from a bed to a chair (including a wheelchair)?: A Lot Help needed standing up from a chair using your arms (e.g., wheelchair or bedside chair)?: A Lot Help needed to walk in hospital room?: A Little Help needed climbing 3-5 steps with a railing? : Total 6 Click Score: 14    End of Session Equipment Utilized During Treatment: Gait belt;Right knee immobilizer Activity Tolerance: Patient tolerated treatment well Patient left: in chair;with call bell/phone within reach;with chair alarm set Nurse Communication: Mobility status PT Visit Diagnosis: Muscle weakness (generalized) (M62.81);Difficulty in walking, not elsewhere classified (R26.2);Pain;Other symptoms and signs involving the nervous system (R29.898);Unsteadiness on feet (R26.81);Other abnormalities of gait and  mobility (R26.89) Pain - Right/Left: Right Pain - part of body: Knee     Time: 4332-9518 PT Time Calculation (min) (ACUTE ONLY): 33 min  Charges:  $Gait Training: 8-22 mins $Therapeutic Activity: 8-22 mins                     Zenaida Niece, PT, DPT Acute Rehabilitation Pager: [phone]    Zenaida Niece 10/06/2020, 12:10 PM

## 2020-10-07 DIAGNOSIS — R279 Unspecified lack of coordination: Secondary | ICD-10-CM | POA: Diagnosis not present

## 2020-10-07 LAB — URINE CULTURE: Culture: >=100000 — AB

## 2020-10-07 LAB — CK: Total CK: 927 U/L — ABNORMAL HIGH (ref 49–397)

## 2020-10-07 LAB — LACTATE DEHYDROGENASE: LDH: 234 U/L — ABNORMAL HIGH (ref 98–192)

## 2020-10-07 NOTE — Progress Notes (Signed)
   Subjective: 7 Days Post-Op Procedure(s) (LRB): TOTAL KNEE ARTHROPLASTY (Right) Patient reports pain as mild.   Plan is to go Rehab after hospital stay. Feeling a lot better today. Had first BM this AM Myalgias and arthralgias are greatly improved  Objective: Vital signs in last 24 hours: Temp:  [98.1 F (36.7 C)-98.7 F (37.1 C)] 98.2 F (36.8 C) (11/22 0348) Pulse Rate:  [79-120] 79 (11/22 0348) Resp:  [16-20] 17 (11/22 0348) BP: (94-129)/(67-85) 118/75 (11/22 0348) SpO2:  [85 %-99 %] 95 % (11/22 0348)  Intake/Output from previous day:  Intake/Output Summary (Last 24 hours) at 10/07/2020 0641 Last data filed at 10/06/2020 2100 Gross per 24 hour  Intake 980 ml  Output 1450 ml  Net -470 ml    Intake/Output this shift: Total I/O In: 100 [P.O.:100] Out: 750 [Urine:750]  Labs: Recent Labs    10/06/20 0819  HGB 9.4*   Recent Labs    10/06/20 0819  WBC 9.6  RBC 2.99*  HCT 28.1*  PLT 247   Recent Labs    10/06/20 0819  NA 136  K 3.6  CL 101  CO2 25  BUN 20  CREATININE 0.72  GLUCOSE 129*  CALCIUM 8.4*   No results for input(s): LABPT, INR in the last 72 hours.  EXAM General - Patient is Alert, Appropriate and Oriented Extremity - Neurologically intact Neurovascular intact No cellulitis present Compartment soft Able to perform independent straight leg raise Dressing/Incision - clean, dry, mild bloody drainage unchanged from last week Motor Function - intact, moving foot and toes well on exam.   Past Medical History:  Diagnosis Date  . Arthritis   . Basal cell carcinoma (BCC) of head   . Basal cell carcinoma, ear    Left ear  . Glaucoma    Left eye    Assessment/Plan: 7 Days Post-Op Procedure(s) (LRB): TOTAL KNEE ARTHROPLASTY (Right) Principal Problem:   Arterial ischemic stroke, multifocal, multiple vascular territories, acute (HCC) Active Problems:   OA (osteoarthritis) of knee   Primary osteoarthritis of right knee    Discoordination   Primary osteoarthritis of knee   Up with therapy  Mr Justin Rose states his wife received a call from his insurance company stating he was now approved for rehab. If accurate, he is OK to go today from my standpoint  DVT Prophylaxis - Aspirin and Plavix Weight-Bearing as tolerated to right leg  Gaynelle Arabian 10/07/2020, 6:41 AM

## 2020-10-07 NOTE — Progress Notes (Signed)
PROGRESS NOTE  Jeryl Umholtz  DOB: [birth year]  PCP: Karlene Einstein, MD GYF:[id number]  DOA: 09/30/2020  LOS: 6 days   Chief complaint: Stroke after TKA  Brief narrative: Saben Donigan is a 86 y.o. male with history of arthritis, no significant cardiovascular issue who underwent elective right total knee on 11/15.    Later that night, patient complained of frontal headache, some visual disturbances like he was seeing squiggles but no visual field loss.  Also developed incoordination of hand movements as he was not able to dial his cell phone or answer it.  He felt tremulous. Rapid response called at night, stat CT scan head was normal.  Neurology was consulted.  With no significant neurological deficit, was not intervention candidate.  Subsequent MRI showed multifocal embolic stroke. TRH consulted after rapid response on 11/15 midnight.  Patient was transferred to was low to [facility] for stroke work-up. Currently waiting for CIR placement.  Subjective: Patient was seen and examined this morning.  Sitting up in chair.  Feels better.  Pain improved.  Had 2 bowel movements yesterday, feels much better with that.  Assessment/Plan: Acute multiple territory ischemic stroke -suspect embolic related to long bone fracture.   -Did not get TPA because of minimal or no neurological deficits -MRI with numerous small scattered acute cortical and subcortical infarcts within the bilateral frontal, parietal and occipital lobes. CTA head and neck did not show any significant carotid or vertebral artery stenosis. -LDL 74, A1c 5.6. -Unclear etiology of stroke, likely embolic stroke of undetermined origin. -Patient did not want implantable loop recorder. Plan is to discharge him with 30-day cardiac event monitor -TTE with EF 60 to 65%, normal LV function, no atrial level shunt and no intracardiac source. -PT/OT evaluation obtained.  CIR recommended.   -Per neurology recommendation, patient is on  aspirin 81 mg daily and Plavix 75 mg daily for 3 weeks afterwards he will be on aspirin monotherapy. -He was also started on Lipitor 20 mg daily.  However, because of myalgia and elevated CK, statin have been stopped.    Osteoarthritis status post right total knee:  -Postop management as per surgery.   -Currently adequate pain management and therapies as above. -PT following.  CIR denied by insurance.  Appeal in process.  Statin induced myositis -Patient complained of myalgias. -CK level elevated at 990, LDH elevated to 198. -Lipitor was stopped.  Currently on IV hydration with normal saline at 75 mill per hour.  Continue the same. -Repeat CK and liver enzymes tomorrow. Recent Labs  Lab 10/06/20 0819 10/07/20 0230  CKTOTAL 990* 927*   Recent Labs  Lab 10/06/20 0819  AST 62*  ALT 43  ALKPHOS 60  BILITOT 1.1  PROT 5.8*  ALBUMIN 2.5*   UTI Acute urinary retention -11/19, complaint of burning urination.  Was found to have urinary retention.   -Coud catheter was placed.   -Currently also on IV Rocephin for 3 to 5-day course.   -Patient denies history of BPH but says he drinks little water at nighttime to avoid nocturia.  -Based on his age, he probably has underlying BPH.  Currently on Flomax.  Mobility: PT eval appreciated Code Status:   Code Status: Full Code  Nutritional status: Body mass index is 26.3 kg/m.     Diet Order            Diet regular Room service appropriate? Yes; Fluid consistency: Thin  Diet effective now  DVT prophylaxis: SCDs Start: 09/30/20 1027 Place TED hose Start: 09/30/20 1027   Antimicrobials:  None Fluid: Normal saline at 75 mill per hour Family Communication:  Friend at bedside  Status is: Inpatient  Remains inpatient appropriate because: Weak.  Generalized pain, may have statin induced myositis   Dispo: The patient is from: Home              Anticipated d/c is to: CIR versus home              Anticipated d/c  date is: Unclear at this time.  Appeal in process              Patient currently is medically stable to d/c.    Infusions:  . sodium chloride 75 mL/hr at 10/07/20 0800  . cefTRIAXone (ROCEPHIN)  IV 1 g (10/07/20 1032)  . methocarbamol (ROBAXIN) IV      Scheduled Meds: . aspirin  81 mg Oral Daily  . brimonidine  1 drop Left Eye QHS   And  . timolol  1 drop Left Eye QHS  . clopidogrel  75 mg Oral Daily  . ipratropium  2 spray Each Nare Daily  . lidocaine  1 application Urethral Once  . loratadine  10 mg Oral Daily  . metoprolol tartrate  50 mg Oral Once  . pantoprazole  40 mg Oral Daily  . polyethylene glycol  17 g Oral BID  . psyllium  1 packet Oral Daily  . senna-docusate  1 tablet Oral QHS  . tamsulosin  0.4 mg Oral Daily    Antimicrobials: Anti-infectives (From admission, onward)   Start     Dose/Rate Route Frequency Ordered Stop   10/05/20 0800  cefTRIAXone (ROCEPHIN) 1 g in sodium chloride 0.9 % 100 mL IVPB        1 g 200 mL/hr over 30 Minutes Intravenous Every 24 hours 10/05/20 0727     09/30/20 1400  ceFAZolin (ANCEF) IVPB 2g/100 mL premix        2 g 200 mL/hr over 30 Minutes Intravenous Every 6 hours 09/30/20 1026 09/30/20 2232   09/30/20 0600  ceFAZolin (ANCEF) IVPB 2g/100 mL premix        2 g 200 mL/hr over 30 Minutes Intravenous On call to O.R. 09/30/20 0530 09/30/20 0714      PRN meds: acetaminophen, bisacodyl, diphenhydrAMINE, lip balm, menthol-cetylpyridinium **OR** phenol, methocarbamol **OR** methocarbamol (ROBAXIN) IV, metoCLOPramide **OR** metoCLOPramide (REGLAN) injection, morphine injection, ondansetron **OR** ondansetron (ZOFRAN) IV, oxyCODONE, polyethylene glycol, sodium phosphate, traMADol   Objective: Vitals:   10/07/20 0348 10/07/20 0909  BP: 118/75 (!) 145/96  Pulse: 79 92  Resp: 17   Temp: 98.2 F (36.8 C) 98 F (36.7 C)  SpO2: 95% 100%    Intake/Output Summary (Last 24 hours) at 10/07/2020 1053 Last data filed at 10/07/2020  0800 Gross per 24 hour  Intake 1694.38 ml  Output 750 ml  Net 944.38 ml   Filed Weights   09/30/20 0543  Weight: 85.5 kg   Weight change:  Body mass index is 26.3 kg/m.   Physical Exam: General exam: Not in distress.  Feels great this morning Skin: No rashes, lesions or ulcers. HEENT: Atraumatic, normocephalic, supple neck, no obvious bleeding Lungs: Clear to auscultation bilaterally CVS: Regular rate and rhythm, no murmur GI/Abd soft, nontender, nondistended, bowel sound present CNS: , Alert, awake, oriented x3 Psychiatry: Mood appropriate. Extremities: No pedal edema, no calf tenderness  Data Review: I have personally reviewed the laboratory data  and studies available.  Recent Labs  Lab 10/01/20 0113 10/03/20 0256 10/06/20 0819  WBC 15.7* 10.7* 9.6  NEUTROABS  --   --  7.2  HGB 11.5* 10.1* 9.4*  HCT 35.1* 30.4* 28.1*  MCV 94.6 95.9 94.0  PLT 203 168 247   Recent Labs  Lab 10/01/20 0113 10/03/20 0256 10/06/20 0819  NA 137 140 136  K 4.1 4.0 3.6  CL 105 105 101  CO2 20* 25 25  GLUCOSE 173* 136* 129*  BUN 16 17 20   CREATININE 0.81 0.69 0.72  CALCIUM 9.0 8.9 8.4*  MG  --   --  2.1  PHOS  --   --  3.2    F/u labs ordered  Signed, Terrilee Croak, MD [facility] 10/07/2020

## 2020-10-07 NOTE — Progress Notes (Signed)
Inpatient Rehabilitation-Admissions Coordinator   Met with pt bedside. He believed he had been approved for IP Rehab after his wife had received a call stating something had been approved. Confirmed with wife, the approval was not for IP Rehab through his [facility] but was for her concerns with [facility] (his acute hospital admission). Pt upset as he thought he would be transferred to CIR today. Per pt request, I will begin an insurance expedited appeal. Will update as soon as there is a determination.   TOC team notified.  Raechel Ache, OTR/L  Rehab Admissions Coordinator  [phone] 10/07/2020 11:05 AM

## 2020-10-07 NOTE — Progress Notes (Signed)
Physical Therapy Treatment Patient Details Name: Justin Rose MRN: [id number] DOB: 01-20-1934 Today's Date: 10/07/2020    History of Present Illness Pt is 86 yo male s/p R TKA 11/215/21. Post op events/symptoms documented MRI(+) Numerous small scattered acute cortical and subcortical infarcts within the bilateral frontal, parietal and occipital lobes. Findings are likely secondary to an embolic process.    PT Comments    Pt received in recliner, eager to participate in therapy. LE exercises performed in recliner with feet elevated. Pt expressed need to have a BM. He required mod assist sit to stand and min assist ambulation 5' x 2 with RW. Gait distance limited by pain and fatigue. He presents with step-to gait pattern with flexed trunk. Pt assisted to from [facility], back to recliner at end of session.    Follow Up Recommendations  CIR     Equipment Recommendations  Rolling walker with 5" wheels;3in1 (PT);Wheelchair (measurements PT)    Recommendations for Other Services       Precautions / Restrictions Precautions Precautions: Fall Required Braces or Orthoses: Knee Immobilizer - Right Knee Immobilizer - Right: Discontinue once straight leg raise with < 10 degree lag Restrictions Weight Bearing Restrictions: No RLE Weight Bearing: Weight bearing as tolerated    Mobility  Bed Mobility               General bed mobility comments: Pt received in recliner and returned to recliner.  Transfers Overall transfer level: Needs assistance Equipment used: Rolling walker (2 wheeled) Transfers: Sit to/from Stand Sit to Stand: Mod assist         General transfer comment: Mod assist to power up. Pt rocking, using momentum to power up.  Ambulation/Gait Ambulation/Gait assistance: Min assist Gait Distance (Feet): 5 Feet (x 2) Assistive device: Rolling walker (2 wheeled) Gait Pattern/deviations: Step-to pattern;Decreased weight shift to right;Trunk flexed Gait velocity:  decreased Gait velocity interpretation: <1.31 ft/sec, indicative of household ambulator General Gait Details: ambulated recliner <> BSC. Cues for sequencing and posture.   Stairs             Wheelchair Mobility    Modified Rankin (Stroke Patients Only) Modified Rankin (Stroke Patients Only) Pre-Morbid Rankin Score: No symptoms Modified Rankin: Moderately severe disability     Balance Overall balance assessment: Needs assistance Sitting-balance support: Feet supported;Single extremity supported Sitting balance-Leahy Scale: Fair     Standing balance support: Bilateral upper extremity supported;During functional activity Standing balance-Leahy Scale: Poor                              Cognition Arousal/Alertness: Awake/alert Behavior During Therapy: WFL for tasks assessed/performed Overall Cognitive Status: Impaired/Different from baseline Area of Impairment: Memory;Problem solving;Safety/judgement;Awareness                     Memory: Decreased short-term memory   Safety/Judgement: Decreased awareness of safety;Decreased awareness of deficits Awareness: Emergent Problem Solving: Difficulty sequencing;Requires verbal cues        Exercises Total Joint Exercises Ankle Circles/Pumps: AROM;Both;20 reps Quad Sets: AROM;Right;Supine;10 reps Heel Slides: AROM;Right;Left;10 reps;AAROM Hip ABduction/ADduction: AROM;Right;Left;10 reps Straight Leg Raises: AROM;Right;Left;5 reps    General Comments        Pertinent Vitals/Pain Pain Assessment: Faces Faces Pain Scale: Hurts little more Pain Location: R knee during mobility Pain Descriptors / Indicators: Grimacing;Guarding;Discomfort Pain Intervention(s): Limited activity within patient's tolerance;Monitored during session;Premedicated before session    Home Living  Prior Function            PT Goals (current goals can now be found in the care plan section) Acute  Rehab PT Goals Patient Stated Goal: independence Progress towards PT goals: Progressing toward goals    Frequency    7X/week      PT Plan Current plan remains appropriate    Co-evaluation              AM-PAC PT "6 Clicks" Mobility   Outcome Measure  Help needed turning from your back to your side while in a flat bed without using bedrails?: A Little Help needed moving from lying on your back to sitting on the side of a flat bed without using bedrails?: A Little Help needed moving to and from a bed to a chair (including a wheelchair)?: A Lot Help needed standing up from a chair using your arms (e.g., wheelchair or bedside chair)?: A Lot Help needed to walk in hospital room?: A Little Help needed climbing 3-5 steps with a railing? : Total 6 Click Score: 14    End of Session Equipment Utilized During Treatment: Gait belt Activity Tolerance: Patient tolerated treatment well Patient left: in chair;with call bell/phone within reach;with chair alarm set Nurse Communication: Mobility status PT Visit Diagnosis: Muscle weakness (generalized) (M62.81);Difficulty in walking, not elsewhere classified (R26.2);Pain;Other symptoms and signs involving the nervous system (R29.898);Unsteadiness on feet (R26.81);Other abnormalities of gait and mobility (R26.89) Pain - Right/Left: Right Pain - part of body: Knee     Time: 6629-4765 PT Time Calculation (min) (ACUTE ONLY): 33 min  Charges:  $Gait Training: 8-22 mins $Therapeutic Exercise: 8-22 mins                     Lorrin Goodell, PT  Office # [phone] Pager [phone]    Lorriane Shire 10/07/2020, 9:31 AM

## 2020-10-07 NOTE — Progress Notes (Signed)
OT NOTE  Pt in room being positioned for CPM. Pt is very excited to use the CPM and requesting time in machine. OT will check back as appropriate.    Fleeta Emmer, OTR/L  Acute Rehabilitation Services Pager: [phone] Office: [phone] .

## 2020-10-07 NOTE — Progress Notes (Signed)
Orthopedic Tech Progress Note Patient Details:  Justin Rose [birth year] [id number] Applied patient to CPM with THERAPY at bedside. Patient was happy to see CPM CPM Right Knee CPM Right Knee: On Right Knee Flexion (Degrees): 40 Right Knee Extension (Degrees): 10 Additional Comments: added ice  Post Interventions Patient Tolerated: Well Instructions Provided: Care of device  [name] 10/07/2020, 12:44 PM

## 2020-10-07 NOTE — Progress Notes (Signed)
Physical Therapy Treatment Patient Details Name: Justin Rose MRN: [id number] DOB: 1934/07/01 Today's Date: 10/07/2020    History of Present Illness Pt is 86 yo male s/p R TKA 11/215/21. Post op events/symptoms documented MRI(+) Numerous small scattered acute cortical and subcortical infarcts within the bilateral frontal, parietal and occipital lobes. Findings are likely secondary to an embolic process.    PT Comments    Assisted ortho tech with transfer back to bed to begin CPM. Pt required mod assist sit to stand with RW, mod assist SPT with RW, and mod assist sit to supine. CPM placed RLE 10-40 degrees.   Follow Up Recommendations  CIR     Equipment Recommendations  Rolling walker with 5" wheels;3in1 (PT);Wheelchair (measurements PT)    Recommendations for Other Services       Precautions / Restrictions Precautions Precautions: Fall Required Braces or Orthoses: Knee Immobilizer - Right Knee Immobilizer - Right: Discontinue once straight leg raise with < 10 degree lag Restrictions RLE Weight Bearing: Weight bearing as tolerated Other Position/Activity Restrictions: WBAT    Mobility  Bed Mobility Overal bed mobility: Needs Assistance         Sit to supine: Mod assist   General bed mobility comments: assist with BLE into bed  Transfers Overall transfer level: Needs assistance Equipment used: Rolling walker (2 wheeled) Transfers: Sit to/from [facility] Sit to Stand: Mod assist Stand pivot transfers: Mod assist       General transfer comment: Mod assist to power up. Pt rocking, using momentum to power up. Pivot steps recliner to bed.  Ambulation/Gait Ambulation/Gait assistance: Min assist Gait Distance (Feet): 5 Feet (x 2) Assistive device: Rolling walker (2 wheeled) Gait Pattern/deviations: Step-to pattern;Decreased weight shift to right;Trunk flexed Gait velocity: decreased Gait velocity interpretation: <1.31 ft/sec, indicative of household  ambulator General Gait Details: ambulated recliner <> BSC. Cues for sequencing and posture.   Stairs             Wheelchair Mobility    Modified Rankin (Stroke Patients Only) Modified Rankin (Stroke Patients Only) Pre-Morbid Rankin Score: No symptoms Modified Rankin: Moderately severe disability     Balance Overall balance assessment: Needs assistance Sitting-balance support: Feet supported;No upper extremity supported Sitting balance-Leahy Scale: Good     Standing balance support: Bilateral upper extremity supported;During functional activity Standing balance-Leahy Scale: Poor Standing balance comment: modA with BUE support of RW                            Cognition Arousal/Alertness: Awake/alert Behavior During Therapy: WFL for tasks assessed/performed Overall Cognitive Status: Impaired/Different from baseline Area of Impairment: Memory;Problem solving;Safety/judgement;Awareness                     Memory: Decreased short-term memory Following Commands: Follows one step commands consistently Safety/Judgement: Decreased awareness of safety;Decreased awareness of deficits Awareness: Emergent Problem Solving: Difficulty sequencing;Requires verbal cues        Exercises Total Joint Exercises Ankle Circles/Pumps: AROM;Both;20 reps Quad Sets: AROM;Right;Supine;10 reps Heel Slides: AROM;Right;Left;10 reps;AAROM Hip ABduction/ADduction: AROM;Right;Left;10 reps Straight Leg Raises: AROM;Right;Left;5 reps    General Comments        Pertinent Vitals/Pain Pain Assessment: Faces Faces Pain Scale: Hurts little more Pain Location: R knee during mobility Pain Descriptors / Indicators: Grimacing;Guarding;Discomfort Pain Intervention(s): Limited activity within patient's tolerance;Monitored during session;Repositioned;Ice applied    Home Living  Prior Function            PT Goals (current goals can now be found in  the care plan section) Acute Rehab PT Goals Patient Stated Goal: independence Progress towards PT goals: Progressing toward goals    Frequency    7X/week      PT Plan Current plan remains appropriate    Co-evaluation              AM-PAC PT "6 Clicks" Mobility   Outcome Measure  Help needed turning from your back to your side while in a flat bed without using bedrails?: A Little Help needed moving from lying on your back to sitting on the side of a flat bed without using bedrails?: A Little Help needed moving to and from a bed to a chair (including a wheelchair)?: A Lot Help needed standing up from a chair using your arms (e.g., wheelchair or bedside chair)?: A Lot Help needed to walk in hospital room?: A Little Help needed climbing 3-5 steps with a railing? : Total 6 Click Score: 14    End of Session Equipment Utilized During Treatment: Gait belt Activity Tolerance: Patient tolerated treatment well Patient left: in CPM;with call bell/phone within reach;with family/visitor present;in bed Nurse Communication: Mobility status PT Visit Diagnosis: Muscle weakness (generalized) (M62.81);Difficulty in walking, not elsewhere classified (R26.2);Pain;Other symptoms and signs involving the nervous system (R29.898);Unsteadiness on feet (R26.81);Other abnormalities of gait and mobility (R26.89) Pain - Right/Left: Right Pain - part of body: Knee     Time: 2426-8341 PT Time Calculation (min) (ACUTE ONLY): 12 min  Charges:  $Gait Training: 8-22 mins $Therapeutic Exercise: 8-22 mins $Therapeutic Activity: 8-22 mins                     Lorrin Goodell, PT  Office # [phone] Pager [phone]    Lorriane Shire 10/07/2020, 12:48 PM

## 2020-10-08 DIAGNOSIS — R279 Unspecified lack of coordination: Secondary | ICD-10-CM | POA: Diagnosis not present

## 2020-10-08 LAB — CBC WITH DIFFERENTIAL/PLATELET
Abs Immature Granulocytes: 0.12 10*3/uL — ABNORMAL HIGH (ref 0.00–0.07)
Basophils Absolute: 0 10*3/uL (ref 0.0–0.1)
Basophils Relative: 0 %
Eosinophils Absolute: 0.4 10*3/uL (ref 0.0–0.5)
Eosinophils Relative: 5 %
HCT: 26.1 % — ABNORMAL LOW (ref 39.0–52.0)
Hemoglobin: 8.7 g/dL — ABNORMAL LOW (ref 13.0–17.0)
Immature Granulocytes: 1 %
Lymphocytes Relative: 14 %
Lymphs Abs: 1.3 10*3/uL (ref 0.7–4.0)
MCH: 31.4 pg (ref 26.0–34.0)
MCHC: 33.3 g/dL (ref 30.0–36.0)
MCV: 94.2 fL (ref 80.0–100.0)
Monocytes Absolute: 0.8 10*3/uL (ref 0.1–1.0)
Monocytes Relative: 9 %
Neutro Abs: 6.4 10*3/uL (ref 1.7–7.7)
Neutrophils Relative %: 71 %
Platelets: 307 10*3/uL (ref 150–400)
RBC: 2.77 MIL/uL — ABNORMAL LOW (ref 4.22–5.81)
RDW: 12.9 % (ref 11.5–15.5)
WBC: 9.1 10*3/uL (ref 4.0–10.5)
nRBC: 0 % (ref 0.0–0.2)

## 2020-10-08 LAB — COMPREHENSIVE METABOLIC PANEL
ALT: 83 U/L — ABNORMAL HIGH (ref 0–44)
AST: 100 U/L — ABNORMAL HIGH (ref 15–41)
Albumin: 2.3 g/dL — ABNORMAL LOW (ref 3.5–5.0)
Alkaline Phosphatase: 74 U/L (ref 38–126)
Anion gap: 7 (ref 5–15)
BUN: 17 mg/dL (ref 8–23)
CO2: 24 mmol/L (ref 22–32)
Calcium: 8.2 mg/dL — ABNORMAL LOW (ref 8.9–10.3)
Chloride: 104 mmol/L (ref 98–111)
Creatinine, Ser: 0.7 mg/dL (ref 0.61–1.24)
GFR, Estimated: >60 mL/min (ref >60)
Glucose, Bld: 115 mg/dL — ABNORMAL HIGH (ref 70–99)
Potassium: 3.8 mmol/L (ref 3.5–5.1)
Sodium: 135 mmol/L (ref 135–145)
Total Bilirubin: 1.2 mg/dL (ref 0.3–1.2)
Total Protein: 5.2 g/dL — ABNORMAL LOW (ref 6.5–8.1)

## 2020-10-08 LAB — CK: Total CK: 518 U/L — ABNORMAL HIGH (ref 49–397)

## 2020-10-08 MED ORDER — CEPHALEXIN 500 MG PO CAPS
500.0000 mg | ORAL_CAPSULE | Freq: Three times a day (TID) | ORAL | Status: AC
  Administered 2020-10-08 – 2020-10-09 (×3): 500 mg via ORAL
  Filled 2020-10-08 (×3): qty 1

## 2020-10-08 NOTE — Progress Notes (Signed)
   Subjective: 8 Days Post-Op Procedure(s) (LRB): TOTAL KNEE ARTHROPLASTY (Right) Patient reports pain as mild.   Patient seen in rounds for Dr. Wynelle Link. Patient is frustrated this AM, states he is ready to get to rehab. The CPM was placed on Mr. Mcglasson around 1pm yesterday, he reports that nobody came to discontinue the device. Will place parameters on CPM today, should not be running for more than 4 hours at a time, once a day.  Objective: Vital signs in last 24 hours: Temp:  [98 F (36.7 C)-98.6 F (37 C)] 98 F (36.7 C) (11/23 0754) Pulse Rate:  [80-92] 89 (11/23 0754) Resp:  [17-19] 19 (11/23 0754) BP: (105-145)/(70-96) 124/81 (11/23 0754) SpO2:  [86 %-100 %] 95 % (11/23 0754)  Intake/Output from previous day:  Intake/Output Summary (Last 24 hours) at 10/08/2020 0801 Last data filed at 10/07/2020 1844 Gross per 24 hour  Intake 505.72 ml  Output 1100 ml  Net -594.28 ml    Intake/Output this shift: No intake/output data recorded.  Labs: Recent Labs    10/06/20 0819 10/08/20 0310  HGB 9.4* 8.7*   Recent Labs    10/06/20 0819 10/08/20 0310  WBC 9.6 9.1  RBC 2.99* 2.77*  HCT 28.1* 26.1*  PLT 247 307   Recent Labs    10/06/20 0819 10/08/20 0310  NA 136 135  K 3.6 3.8  CL 101 104  CO2 25 24  BUN 20 17  CREATININE 0.72 0.70  GLUCOSE 129* 115*  CALCIUM 8.4* 8.2*   No results for input(s): LABPT, INR in the last 72 hours.  Exam: General - Patient is Alert and Oriented Extremity - Neurologically intact Neurovascular intact Sensation intact distally Dorsiflexion/Plantar flexion intact Dressing/Incision - clean, dry with minimal bloody drainage underneath Aquacel. Motor Function - intact, moving foot and toes well on exam.   Past Medical History:  Diagnosis Date  . Arthritis   . Basal cell carcinoma (BCC) of head   . Basal cell carcinoma, ear    Left ear  . Glaucoma    Left eye    Assessment/Plan: 8 Days Post-Op Procedure(s) (LRB): TOTAL KNEE  ARTHROPLASTY (Right) Principal Problem:   Arterial ischemic stroke, multifocal, multiple vascular territories, acute (HCC) Active Problems:   OA (osteoarthritis) of knee   Primary osteoarthritis of right knee   Discoordination   Primary osteoarthritis of knee  Estimated body mass index is 26.3 kg/m as calculated from the following:   Height as of this encounter: 5\' 11"  (1.803 m).   Weight as of this encounter: 85.5 kg. Up with therapy  DVT Prophylaxis - Aspirin and Plavix Weight-bearing as tolerated  Continue working with therapy. Hopeful for transfer to CIR once this is approved. Social work currently working on expedited appeal.  Theresa Duty, PA-C Orthopedic Surgery [phone] 10/08/2020, 8:01 AM

## 2020-10-08 NOTE — Evaluation (Signed)
Occupational Therapy Evaluation Patient Details Name: Justin Rose MRN: [id number] DOB: [birth year] Today's Date: 10/08/2020    History of Present Illness Pt is 86 yo male s/p R TKA 11/215/21. Post op events/symptoms documented MRI(+) Numerous small scattered acute cortical and subcortical infarcts within the bilateral frontal, parietal and occipital lobes. Findings are likely secondary to an embolic process.   Clinical Impression   PT admitted with R TKA. Pt currently with functional limitiations due to the deficits listed below (see OT problem list). Pt progressed with eva walker but remains with balance deficits and cognitive changes. Pt high risk for falls. Pt reports pain at back and neck L side. Pt will benefit from skilled OT to increase their independence and safety with adls and balance to allow discharge CIR.      Follow Up Recommendations  CIR    Equipment Recommendations  3 in 1 bedside commode    Recommendations for Other Services Rehab consult     Precautions / Restrictions Precautions Precautions: Fall Required Braces or Orthoses: Knee Immobilizer - Right Knee Immobilizer - Right: Discontinue once straight leg raise with < 10 degree lag Restrictions Weight Bearing Restrictions: No RLE Weight Bearing: Weight bearing as tolerated      Mobility Bed Mobility Overal bed mobility: Needs Assistance Bed Mobility: Supine to Sit     Supine to sit: Min assist     General bed mobility comments: oob in chair at start and finish of session    Transfers Overall transfer level: Needs assistance Equipment used: Bilateral platform walker Transfers: Sit to/from Stand Sit to Stand: Mod assist         General transfer comment: pt completed sit<>stand x2 during session. pt able to progress with eva walker    Balance Overall balance assessment: Needs assistance Sitting-balance support: Feet supported;No upper extremity supported Sitting balance-Leahy Scale:  Good Sitting balance - Comments: no LOB in sitting but pt reports fatigue in sitting EOB after performing LE ther ex Postural control: Posterior lean Standing balance support: Bilateral upper extremity supported;During functional activity Standing balance-Leahy Scale: Fair Standing balance comment: reliance on Eva walker at elbows of R ue for oral care. OT locking eva walker for stabiilty                           ADL either performed or assessed with clinical judgement   ADL Overall ADL's : Needs assistance/impaired Eating/Feeding: Set up;Sitting Eating/Feeding Details (indicate cue type and reason): pt insistign wife place egg salad sandwich on window for later even with education that we can get him a fresh one for dinner. pt with poor intake of lunch Grooming: Oral care;Standing;Moderate assistance (eva walker) Grooming Details (indicate cue type and reason): OT helping hand pt all needed items and one step commands to complete oral task. OT holding basin for spitting                 Toilet Transfer: Moderate assistance Toilet Transfer Details (indicate cue type and reason): simulated from chair         Functional mobility during ADLs: Moderate assistance (eva walker) General ADL Comments: pt progressed with mod cues with eva walker to sink surface and back.      Vision         Perception     Praxis      Pertinent Vitals/Pain Pain Assessment: Faces Pain Score: 6  Faces Pain Scale: Hurts little more Pain Location: R knee during  mobility Pain Descriptors / Indicators: Grimacing;Guarding;Discomfort Pain Intervention(s): Limited activity within patient's tolerance;Monitored during session;Premedicated before session;Repositioned;Ice applied     Hand Dominance     Extremity/Trunk Assessment             Communication     Cognition Arousal/Alertness: Awake/alert Behavior During Therapy: WFL for tasks assessed/performed Overall Cognitive Status:  Impaired/Different from baseline Area of Impairment: Safety/judgement;Awareness                     Memory: Decreased short-term memory Following Commands: Follows one step commands with increased time Safety/Judgement: Decreased awareness of safety;Decreased awareness of deficits Awareness: Emergent Problem Solving: Slow processing General Comments: pt continued to state "when you were here earlier" pt told multiple times during session that OT has not come to room today. Pt easily redirected to task during session   General Comments       Exercises Exercises: Total Joint Total Joint Exercises Ankle Circles/Pumps: AROM;Both;20 reps;Supine Quad Sets: AROM;Right;Supine;10 reps Short Arc QuadSinclair Ship;Right;10 reps;Supine Heel Slides: AROM;Right;10 reps;AAROM Straight Leg Raises: AROM;Right;Left;10 reps Long Arc Quad: AROM;Right;10 reps;Seated Knee Flexion: AAROM;Right;Seated;10 reps (assisted stretching x15 sec each rep) Goniometric ROM: AROM 10-35 deg, AAROM 40 deg flexion Other Exercises Other Exercises: unable to complete single leg raise with multiple attempts. KI used throughout session   Shoulder Instructions      Home Living                                          Prior Functioning/Environment                   OT Problem List:        OT Treatment/Interventions:      OT Goals(Current goals can be found in the care plan section) Acute Rehab OT Goals Patient Stated Goal: to be home by thanksgiving OT Goal Formulation: With patient/family Time For Goal Achievement: 10/16/20 Potential to Achieve Goals: Good ADL Goals Pt Will Perform Grooming: with min guard assist;standing Pt Will Transfer to Toilet: with mod assist;bedside commode;ambulating Additional ADL Goal #1: pt will complete bed mobility min (A) as precursor to adls. Additional ADL Goal #2: pt will complete basic transfer mod (A) with RW as precursor to adls  OT Frequency:  Min 2X/week   Barriers to D/C:            Co-evaluation              AM-PAC OT "6 Clicks" Daily Activity     Outcome Measure Help from another person eating meals?: A Little Help from another person taking care of personal grooming?: A Little Help from another person toileting, which includes using toliet, bedpan, or urinal?: A Lot Help from another person bathing (including washing, rinsing, drying)?: A Lot Help from another person to put on and taking off regular upper body clothing?: A Little Help from another person to put on and taking off regular lower body clothing?: A Lot 6 Click Score: 15   End of Session Equipment Utilized During Treatment: Gait belt;Other (comment) (eva walker) CPM Right Knee CPM Right Knee: Off Nurse Communication: Mobility status;Precautions  Activity Tolerance: Patient tolerated treatment well Patient left: in chair;with call bell/phone within reach;with chair alarm set  OT Visit Diagnosis: Unsteadiness on feet (R26.81);Muscle weakness (generalized) (M62.81);Pain Pain - Right/Left: Right Pain - part of body: Knee  Time: 9983-3825 OT Time Calculation (min): 35 min Charges:  OT General Charges $OT Visit: 1 Visit OT Treatments $Self Care/Home Management : 23-37 mins   Brynn, OTR/L  Acute Rehabilitation Services Pager: [phone] Office: [phone] .   Jeri Modena 10/08/2020, 3:46 PM

## 2020-10-08 NOTE — H&P (Signed)
Physical Medicine and Rehabilitation Admission H&P    CC: Functional deficits.   HPI: Justin Rose is a 86 y.o. male with history of glaucoma, BCC, OA bilateral Knees who was admitted on 09/30/20 for R-TKR by Dr. Wynelle Link. History taken from chart review and patient. Posts op he reported dizziness with left elbow numbness as well as visual deficits with floaters as well as transient speech difficutly.  CT head unremarkable for acute intracranial process. 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  CTA head/neck was negative for LVO or significant stenosis. BLE dopplers negative for DVT. Echocardiogram with EF of 60-65%. Dr. 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  Hospital course further complicated by urinary retention and foley placed. Urine culture positive for proteus mirabilis.  He was started on lipitor but developed myalgias with elevated CK and abormal LFTs felt to be due to statin induced myositis treated with IVF with improvement in muscle pain. Therapy evaluations revealed mild cognitive impairment as well as LLE limitation due to recent TKR. CIR recommended due to functional deficits. Please see preadmission assessment from earlier today as well.    Review of Systems  Constitutional: Positive for malaise/fatigue.  HENT: Positive for hearing loss. Negative for tinnitus.   Respiratory: Negative for cough and shortness of breath.   Cardiovascular: Negative for chest pain, claudication and leg swelling.  Gastrointestinal: Positive for diarrhea.  Musculoskeletal: Positive for joint pain and myalgias.  Skin: Negative for rash.  Neurological: Positive for dizziness, focal weakness and weakness. Negative for speech change.  All  other systems reviewed and are negative.   Past Medical History:  Diagnosis Date  . Arthritis   . Basal cell carcinoma (BCC) of head   . Basal cell carcinoma, ear    Left ear  . Glaucoma    Left eye    Past Surgical History:  Procedure Laterality Date  . BACK SURGERY     2021  . BASAL CELL CARCINOMA EXCISION    . EYE SURGERY     08/2020 left eye  . HAND SURGERY    . PARATHYROIDECTOMY    . TONSILLECTOMY    . TOTAL KNEE ARTHROPLASTY Right 09/30/2020   Procedure: TOTAL KNEE ARTHROPLASTY;  Surgeon: Gaynelle Arabian, MD;  Location: [facility];  Service: Orthopedics;  Laterality: Right;    No pertinent family history of premature cva. Both parents had cancer.    Social History:  Married. 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 He reports that he has quit smoking. He has a 4.00 pack-year smoking history. He has never used smokeless tobacco. He reports that he does not drink alcohol and does not use drugs.    Allergies: No Known Allergies    Medications Prior to Admission  Medication Sig Dispense Refill  . brimonidine-timolol (COMBIGAN) 0.2-0.5 % ophthalmic solution Place 1 drop into the left eye daily.    . cetirizine (ZYRTEC) 10 MG tablet Take 10 mg by mouth daily.    . cholecalciferol (VITAMIN D3) 25 MCG (1000 UNIT) tablet Take 1,000 Units by mouth daily.    Marland Kitchen GLUCOSAMINE-CHONDROITIN PO Take 1 tablet by mouth daily.    Marland Kitchen ipratropium (ATROVENT) 0.06 % nasal spray Place 2 sprays into both nostrils daily.    Marland Kitchen MAGNESIUM PO Take 1  tablet by mouth daily.    . meloxicam (MOBIC) 15 MG tablet Take 15 mg by mouth daily.    . vitamin B-12 (CYANOCOBALAMIN) 1000 MCG tablet Take 1,000 mcg by mouth daily.      Drug Regimen Review  Drug regimen was reviewed and remains appropriate with no significant issues identified  Home: Home Living Family/patient expects to be discharged to:: Private residence Living Arrangements: Spouse/significant other Available Help at Discharge:  Family Type of Home: House Home Access: Stairs to enter Technical brewer of Steps: 3 Entrance Stairs-Rails: Left Home Layout: One level Bathroom Shower/Tub: Multimedia programmer: Handicapped height Bathroom Accessibility: Yes Home Equipment: Environmental consultant - 2 wheels, Crutches, [place] - single point, Wheelchair - manual Additional Comments: wife named Environmental education officer   Functional History: Prior Function Level of Independence: Independent Comments: reports enjoys playing golf and does all the yard work  Functional Status:  Mobility: Bed Mobility Overal bed mobility: Needs Assistance Bed Mobility: Sit to Supine Supine to sit: Min assist Sit to supine: Mod assist General bed mobility comments: mod A for RLE and positioning in CPM Transfers Overall transfer level: Needs assistance Equipment used: Rolling walker (2 wheeled) Transfers: Sit to/from Stand Sit to Stand: Mod assist Stand pivot transfers: Mod assist General transfer comment: needed mod A for power up and fwd wt shift from recliner. Had difficulty stepping feet to bed with use of RW.  Ambulation/Gait Ambulation/Gait assistance: Min assist, Mod assist Gait Distance (Feet): 6 Feet Assistive device: Rolling walker (2 wheeled) Gait Pattern/deviations: Step-to pattern, Decreased weight shift to right, Trunk flexed General Gait Details: pt became light headed at 49' and needed to return to sitting, vc's for posture Gait velocity: decreased Gait velocity interpretation: <1.31 ft/sec, indicative of household ambulator    ADL: ADL Overall ADL's : Needs assistance/impaired Eating/Feeding: Set up, Sitting Eating/Feeding Details (indicate cue type and reason): pt insistign wife place egg salad sandwich on window for later even with education that we can get him a fresh one for dinner. pt with poor intake of lunch Grooming: Oral care, Standing, Moderate assistance (eva walker) Grooming Details (indicate cue type and reason): OT  helping hand pt all needed items and one step commands to complete oral task. OT holding basin for spitting Lower Body Dressing: Total assistance Toilet Transfer: Moderate assistance Toilet Transfer Details (indicate cue type and reason): simulated from chair Functional mobility during ADLs: Moderate assistance (eva walker) General ADL Comments: pt progressed with mod cues with eva walker to sink surface and back.   Cognition: Cognition Overall Cognitive Status: Impaired/Different from baseline Arousal/Alertness: Awake/alert Orientation Level: Oriented X4 Attention: Selective Selective Attention: Impaired Selective Attention Impairment: Verbal basic Memory: Impaired Memory Impairment: Storage deficit Problem Solving: Impaired Problem Solving Impairment: Verbal complex Cognition Arousal/Alertness: Awake/alert Behavior During Therapy: WFL for tasks assessed/performed Overall Cognitive Status: Impaired/Different from baseline Area of Impairment: Safety/judgement, Awareness, Memory Memory: Decreased short-term memory Following Commands: Follows one step commands with increased time Safety/Judgement: Decreased awareness of safety, Decreased awareness of deficits Awareness: Emergent Problem Solving: Slow processing General Comments: pt having difficulty recalling sequence of day. When asked if he is ready to get out of chair, he states, "you tell me if it's time". Encouraged to advocate for himself when asked and when he feels uncomfortable. RN had this comversation with him this morning as well in regard to his PRN meds  Physical Exam: Blood pressure 106/68, pulse 96, temperature 98.4 F (36.9 C), temperature source Oral, resp. rate 18, height 5\' 11"  (1.803  m), weight 85.5 kg, SpO2 97 %. Physical Exam Vitals reviewed.  Constitutional:      General: He is not in acute distress.    Appearance: He is not ill-appearing.  HENT:     Head: Normocephalic and atraumatic.     Right Ear:  External ear normal.     Left Ear: External ear normal.     Nose: Nose normal.  Eyes:     General:        Right eye: No discharge.        Left eye: No discharge.     Extraocular Movements: Extraocular movements intact.  Cardiovascular:     Rate and Rhythm: Normal rate and regular rhythm.  Pulmonary:     Effort: Pulmonary effort is normal.     Breath sounds: Normal breath sounds.  Abdominal:     General: Abdomen is flat. Bowel sounds are normal. There is no distension.  Musculoskeletal:     Cervical back: Normal range of motion and neck supple.     Comments: Right knee with edema and tenderness Intrinsic muscle atrophy  Skin:    General: Skin is warm and dry.     Comments: Right knee with dressing CDI  Neurological:     Mental Status: He is alert and oriented to person, place, and time.     Comments: Alert and oriented Motor: B/l UE: 5/5 proximal to distal RLE: HF 3-/5, KE limited, ADF 4/5 (pain inhibition) LLE: HF, KE 4-/5, ADF 4/5  Psychiatric:        Mood and Affect: Mood normal.        Behavior: Behavior normal.     Results for orders placed or performed during the hospital encounter of 09/30/20 (from the past 48 hour(s))  CK     Status: Abnormal   Collection Time: 10/08/20  3:10 AM  Result Value Ref Range   Total CK 518 (H) 49.0 - 397.0 U/L    Comment: Performed at [facility], [place] [address], [place], [place] [address]  Comprehensive metabolic panel     Status: Abnormal   Collection Time: 10/08/20  3:10 AM  Result Value Ref Range   Sodium 135 135 - 145 mmol/L   Potassium 3.8 3.5 - 5.1 mmol/L   Chloride 104 98 - 111 mmol/L   CO2 24 22 - 32 mmol/L   Glucose, Bld 115 (H) 70 - 99 mg/dL    Comment: Glucose reference range applies only to samples taken after fasting for at least 8 hours.   BUN 17 8 - 23 mg/dL   Creatinine, Ser 0.70 0.61 - 1.24 mg/dL   Calcium 8.2 (L) 8.9 - 10.3 mg/dL   Total Protein 5.2 (L) 6.5 - 8.1 g/dL   Albumin 2.3 (L) 3.5 - 5.0 g/dL    AST 100 (H) 15 - 41 U/L   ALT 83 (H) 0 - 44 U/L   Alkaline Phosphatase 74 38 - 126 U/L   Total Bilirubin 1.2 0.3 - 1.2 mg/dL   GFR, Estimated >60 >60 mL/min    Comment: (NOTE) Calculated using the CKD-EPI Creatinine Equation (2021)    Anion gap 7 5 - 15    Comment: Performed at [facility], [place] [address] [place], [place] [address]  CBC with Differential/Platelet     Status: Abnormal   Collection Time: 10/08/20  3:10 AM  Result Value Ref Range   WBC 9.1 4.0 - 10.5 K/uL   RBC 2.77 (L) 4.22 - 5.81  MIL/uL   Hemoglobin 8.7 (L) 13.0 - 17.0 g/dL   HCT 26.1 (L) 39 - 52 %   MCV 94.2 80.0 - 100.0 fL   MCH 31.4 26.0 - 34.0 pg   MCHC 33.3 30.0 - 36.0 g/dL   RDW 12.9 11.5 - 15.5 %   Platelets 307 150 - 400 K/uL   nRBC 0.0 0.0 - 0.2 %   Neutrophils Relative % 71 %   Neutro Abs 6.4 1.7 - 7.7 K/uL   Lymphocytes Relative 14 %   Lymphs Abs 1.3 0.7 - 4.0 K/uL   Monocytes Relative 9 %   Monocytes Absolute 0.8 0.1 - 1.0 K/uL   Eosinophils Relative 5 %   Eosinophils Absolute 0.4 0.0 - 0.5 K/uL   Basophils Relative 0 %   Basophils Absolute 0.0 0.0 - 0.1 K/uL   Immature Granulocytes 1 %   Abs Immature Granulocytes 0.12 (H) 0.00 - 0.07 K/uL    Comment: Performed at [facility] [address], [place], [place] [address]  CK     Status: None   Collection Time: 10/09/20  3:37 AM  Result Value Ref Range   Total CK 218 49.0 - 397.0 U/L    Comment: Performed at [facility], [place] [address] [place], [place] [address]  Hepatic function panel     Status: Abnormal   Collection Time: 10/09/20  3:37 AM  Result Value Ref Range   Total Protein 5.3 (L) 6.5 - 8.1 g/dL   Albumin 2.4 (L) 3.5 - 5.0 g/dL   AST 68 (H) 15 - 41 U/L   ALT 77 (H) 0 - 44 U/L   Alkaline Phosphatase 72 38 - 126 U/L   Total Bilirubin 1.2 0.3 - 1.2 mg/dL   Bilirubin, Direct 0.2 0.0 - 0.2 mg/dL   Indirect Bilirubin 1.0 (H) 0.3 - 0.9 mg/dL    Comment: Performed at [facility] [address]  [place],  [address]   No results found.     Medical Problem List and Plan: 1.  Limitation with mobility, transfers, self-care secondary to right TKA  -patient shower  -ELOS/Goals: 13-16 days/Supervision/Mod I  Admit to CIR 2.  Antithrombotics: -DVT/anticoagulation:  Mechanical: Sequential compression devices, below knee Bilateral lower extremities. Will check dopplers in am due to poor mobility.   -antiplatelet therapy: ASA/Plavix 3. Pain Management: Oxycodone, tramadol and/or robaxin prn.  4. Mood: LCSW to follow for evaluation and support.   -antipsychotic agents: N/A 5. Neuropsych: This patient is capable of making decisions on his own behalf. 6. Skin/Wound Care: Monitor incision for healing. Add protein supplement to help promote wound healing.  7. Fluids/Electrolytes/Nutrition: Monitor I/O.   CMP ordered for tomorrow AM 8. Dizziness: Monitor orthostatic vitals. Encourage fluid intake. 9. ABLA: H/H with steady downward trend. 13.9-->8.7.   Stool guaiacs ordered  CBC ordered for tomorrow AM  Iron supplement added 10. Statin induced myositis: Abnormal LFTs being monitored.    CMP ordered for tomorrow AM 11. Drug induced constipation: Resolved--and now with diarrhea likely multifactorial.  Will hold bowel meds 12. Proteus UTI:  IV Rocephin X 3 days-->Keflex thorough 11/27 per recs.  13. Urinary retention: Continue Flomax.  Plan to d/c foley tomorrow with voiding trial  Bary Leriche, PA-C  10/09/2020   I have personally performed a face to face diagnostic evaluation, including, but not limited to relevant history and physical exam findings, of this patient and developed relevant assessment and plan.  Additionally, I have  reviewed and concur with the physician assistant's documentation above.  Delice Lesch, MD, ABPMR

## 2020-10-08 NOTE — Progress Notes (Signed)
PROGRESS NOTE  Justin Rose  DOB: December 07, 1933  PCP: Karlene Einstein, MD DQQ:[id number]  DOA: 09/30/2020  LOS: 7 days   Chief complaint: Stroke after TKA  Brief narrative: Justin Rose is a 86 y.o. male with history of arthritis, no significant cardiovascular issue who underwent elective right total knee on 11/15.    Later that night, patient complained of frontal headache, some visual disturbances like he was seeing squiggles but no visual field loss.  Also developed incoordination of hand movements as he was not able to dial his cell phone or answer it.  He felt tremulous. Rapid response called at night, stat CT scan head was normal.  Neurology was consulted.  With no significant neurological deficit, was not intervention candidate.  Subsequent MRI showed multifocal embolic stroke. TRH consulted after rapid response on 11/15 midnight.  Patient was transferred to was low to [facility] for stroke work-up. Stroke work-up completed. Currently waiting for CIR placement. While waiting for CIR, his stay was complicated by UTI, urine retention and statin induced myositis.  Subjective: Patient was seen and examined this morning.  He is upset this morning because of not getting enough attention by staff even on repeated calling yesterday. He has valid concerns. Waiting for CIR bed availability  Assessment/Plan: Acute multiple territory ischemic stroke -suspect embolic related to long bone fracture.   -Did not get TPA because of minimal or no neurological deficits -MRI with numerous small scattered acute cortical and subcortical infarcts within the bilateral frontal, parietal and occipital lobes. CTA head and neck did not show any significant carotid or vertebral artery stenosis. -LDL 74, A1c 5.6. -Unclear etiology of stroke, likely embolic stroke of undetermined origin. -Patient did not want implantable loop recorder. Plan is to discharge him with 30-day cardiac event monitor -TTE with  EF 60 to 65%, normal LV function, no atrial level shunt and no intracardiac source. -PT/OT evaluation obtained.  CIR recommended.   -Per neurology recommendation, patient is on aspirin 81 mg daily and Plavix 75 mg daily for 3 weeks afterwards he will be on aspirin monotherapy. -He was also started on Lipitor 20 mg daily.  However, because of myalgia and elevated CK, statin have been stopped.    Osteoarthritis status post right total knee:  -Underwent total knee replacement on 11/15. -Continue pain management and therapies as above. -PT following.  CIR denied by insurance.  Expedited appeal in process.  Statin induced myositis -11/21, patient complained of myalgias. -CK, LDH and AST>>ALT were elevated. -Lipitor was stopped.  Given IV hydration with normal saline.   -CK level improving.  Muscle pain improving. -Repeat CK and liver enzymes tomorrow. Recent Labs  Lab 10/06/20 0819 10/07/20 0230 10/08/20 0310  CKTOTAL 990* 927* 518*   Recent Labs  Lab 10/06/20 0819 10/08/20 0310  AST 62* 100*  ALT 43 83*  ALKPHOS 60 74  BILITOT 1.1 1.2  PROT 5.8* 5.2*  ALBUMIN 2.5* 2.3*   UTI with Proteus mirabilis Acute urinary retention -11/19, complaint of burning urination.  Was found to have urinary retention.   -Coud catheter was placed. -Urine culture grew Proteus mirabilis more than 100,000 CFU per mL -Currently patient is on IV Rocephin for 3 to 5-day course.  Okay to switch to Keflex today for next 3 days -Patient denies history of BPH but says he drinks little water at nighttime to avoid nocturia.  -Based on his age, he probably has underlying BPH.  Currently on Flomax.  Mobility: PT eval appreciated Code  Status:   Code Status: Full Code  Nutritional status: Body mass index is 26.3 kg/m.     Diet Order            Diet regular Room service appropriate? Yes; Fluid consistency: Thin  Diet effective now                 DVT prophylaxis: SCDs Start: 09/30/20 1027 Place TED  hose Start: 09/30/20 1027   Antimicrobials:  None Fluid: Normal saline at 75 mill per hour Family Communication:  Friend at bedside  Status is: Inpatient  Remains inpatient appropriate because: Weak.  Generalized pain, may have statin induced myositis   Dispo: The patient is from: Home              Anticipated d/c is to: CIR versus home              Anticipated d/c date is: Unclear at this time.  Appeal in process              Patient currently is medically stable to d/c.    Infusions:  . methocarbamol (ROBAXIN) IV      Scheduled Meds: . aspirin  81 mg Oral Daily  . brimonidine  1 drop Left Eye QHS   And  . timolol  1 drop Left Eye QHS  . cephALEXin  500 mg Oral Q8H  . clopidogrel  75 mg Oral Daily  . ipratropium  2 spray Each Nare Daily  . lidocaine  1 application Urethral Once  . loratadine  10 mg Oral Daily  . metoprolol tartrate  50 mg Oral Once  . pantoprazole  40 mg Oral Daily  . polyethylene glycol  17 g Oral BID  . psyllium  1 packet Oral Daily  . senna-docusate  1 tablet Oral QHS  . tamsulosin  0.4 mg Oral Daily    Antimicrobials: Anti-infectives (From admission, onward)   Start     Dose/Rate Route Frequency Ordered Stop   10/08/20 1400  cephALEXin (KEFLEX) capsule 500 mg        500 mg Oral Every 8 hours 10/08/20 1203 10/09/20 1359   10/05/20 0800  cefTRIAXone (ROCEPHIN) 1 g in sodium chloride 0.9 % 100 mL IVPB  Status:  Discontinued        1 g 200 mL/hr over 30 Minutes Intravenous Every 24 hours 10/05/20 0727 10/08/20 1203   09/30/20 1400  ceFAZolin (ANCEF) IVPB 2g/100 mL premix        2 g 200 mL/hr over 30 Minutes Intravenous Every 6 hours 09/30/20 1026 09/30/20 2232   09/30/20 0600  ceFAZolin (ANCEF) IVPB 2g/100 mL premix        2 g 200 mL/hr over 30 Minutes Intravenous On call to O.R. 09/30/20 0530 09/30/20 0714      PRN meds: acetaminophen, bisacodyl, diphenhydrAMINE, lip balm, menthol-cetylpyridinium **OR** phenol, methocarbamol **OR**  methocarbamol (ROBAXIN) IV, metoCLOPramide **OR** metoCLOPramide (REGLAN) injection, morphine injection, ondansetron **OR** ondansetron (ZOFRAN) IV, oxyCODONE, polyethylene glycol, sodium phosphate, traMADol   Objective: Vitals:   10/08/20 0754 10/08/20 1203  BP: 124/81 110/73  Pulse: 89 (!) 102  Resp: 19 20  Temp: 98 F (36.7 C) 97.9 F (36.6 C)  SpO2: 95% 99%    Intake/Output Summary (Last 24 hours) at 10/08/2020 1320 Last data filed at 10/07/2020 1844 Gross per 24 hour  Intake 505.72 ml  Output 1100 ml  Net -594.28 ml   Filed Weights   09/30/20 0543  Weight: 85.5 kg  Weight change:  Body mass index is 26.3 kg/m.   Physical Exam: General exam: Not in physical distress. Skin: No rashes, lesions or ulcers. HEENT: Atraumatic, normocephalic, supple neck, no obvious bleeding Lungs: Clear to auscultation bilaterally CVS: Regular rate and rhythm, no murmur GI/Abd soft, nontender, nondistended, bowel sound present CNS: Alert, awake, oriented x3 Psychiatry: Upset this morning with nursing care from last night Extremities: No pedal edema, no calf tenderness  Data Review: I have personally reviewed the laboratory data and studies available.  Recent Labs  Lab 10/03/20 0256 10/06/20 0819 10/08/20 0310  WBC 10.7* 9.6 9.1  NEUTROABS  --  7.2 6.4  HGB 10.1* 9.4* 8.7*  HCT 30.4* 28.1* 26.1*  MCV 95.9 94.0 94.2  PLT 168 247 307   Recent Labs  Lab 10/03/20 0256 10/06/20 0819 10/08/20 0310  NA 140 136 135  K 4.0 3.6 3.8  CL 105 101 104  CO2 25 25 24   GLUCOSE 136* 129* 115*  BUN 17 20 17   CREATININE 0.69 0.72 0.70  CALCIUM 8.9 8.4* 8.2*  MG  --  2.1  --   PHOS  --  3.2  --     F/u labs ordered  Signed, Terrilee Croak, MD [facility] 10/08/2020

## 2020-10-08 NOTE — Progress Notes (Signed)
Inpatient Rehabilitation-Admissions Coordinator   Called pt's insurance company to confirm they received my expedited appeal request. Per insurance it is being processed. Will update as soon as there is a determination.   Raechel Ache, OTR/L  Rehab Admissions Coordinator  [phone] 10/08/2020 1:24 PM

## 2020-10-08 NOTE — Progress Notes (Signed)
Physical Therapy Treatment Patient Details Name: Justin Rose MRN: [id number] DOB: 05-01-1934 Today's Date: 10/08/2020    History of Present Illness Pt is 86 yo male s/p R TKA 11/215/21. Post op events/symptoms documented MRI(+) Numerous small scattered acute cortical and subcortical infarcts within the bilateral frontal, parietal and occipital lobes. Findings are likely secondary to an embolic process.    PT Comments    Pt progressing slowly with mobility. Fatigues very quickly, even when sitting EOB for LE there ex. Performed LE there ex active and active assisted in supine and sitting as well as manual flexion and extension stretch to R knee. Pt able to come to EOB with mod vc's and min A. With sit>stand he is having difficulty with fwd wt shift and requires mod A to complete last 50% of stand. Pt ambulated 6' with min A progressing to mod due to pt becoming light headed and needing to sit. Pt very anxious to progress mobility and return to independence. PT will continue to follow.    Follow Up Recommendations  CIR     Equipment Recommendations  Rolling walker with 5" wheels;3in1 (PT);Wheelchair (measurements PT)    Recommendations for Other Services Rehab consult     Precautions / Restrictions Precautions Precautions: Fall Required Braces or Orthoses: Knee Immobilizer - Right Knee Immobilizer - Right: Discontinue once straight leg raise with < 10 degree lag Restrictions Weight Bearing Restrictions: No RLE Weight Bearing: Weight bearing as tolerated    Mobility  Bed Mobility Overal bed mobility: Needs Assistance Bed Mobility: Supine to Sit     Supine to sit: Min assist     General bed mobility comments: pt needed mod vc's for sequencing movement to begin coming to EOB. Used rail and needed increased time but was able to get to EOB with min A to LLE for stability as he performed trunk elevation.   Transfers Overall transfer level: Needs assistance Equipment used: Rolling  walker (2 wheeled) Transfers: Sit to/from Stand Sit to Stand: Mod assist         General transfer comment: performed 3x for strengthening as well as practice with fwd wt shift. Pt able to iniate STS but unable to get wt forward over feet without min A.   Ambulation/Gait Ambulation/Gait assistance: Min assist;Mod assist Gait Distance (Feet): 6 Feet Assistive device: Rolling walker (2 wheeled) Gait Pattern/deviations: Step-to pattern;Decreased weight shift to right;Trunk flexed Gait velocity: decreased Gait velocity interpretation: <1.31 ft/sec, indicative of household ambulator General Gait Details: pt became light headed at 54' and needed to return to sitting, vc's for posture   Stairs             Wheelchair Mobility    Modified Rankin (Stroke Patients Only) Modified Rankin (Stroke Patients Only) Pre-Morbid Rankin Score: No symptoms Modified Rankin: Moderately severe disability     Balance Overall balance assessment: Needs assistance Sitting-balance support: Feet supported;No upper extremity supported Sitting balance-Leahy Scale: Good Sitting balance - Comments: no LOB in sitting but pt reports fatigue in sitting EOB after performing LE ther ex Postural control: Posterior lean Standing balance support: Bilateral upper extremity supported;During functional activity Standing balance-Leahy Scale: Poor Standing balance comment: min A with BUE support of RW                            Cognition Arousal/Alertness: Awake/alert Behavior During Therapy: WFL for tasks assessed/performed Overall Cognitive Status: Impaired/Different from baseline Area of Impairment: Memory;Problem solving;Safety/judgement;Awareness  Memory: Decreased short-term memory Following Commands: Follows one step commands consistently Safety/Judgement: Decreased awareness of safety;Decreased awareness of deficits Awareness: Emergent Problem Solving: Difficulty  sequencing;Requires verbal cues General Comments: Some difficulty sequencing      Exercises Total Joint Exercises Ankle Circles/Pumps: AROM;Both;20 reps;Supine Quad Sets: AROM;Right;Supine;10 reps Short Arc QuadSinclair Ship;Right;10 reps;Supine Heel Slides: AROM;Right;10 reps;AAROM Straight Leg Raises: AROM;Right;Left;10 reps Long Arc Quad: AROM;Right;10 reps;Seated Knee Flexion: AAROM;Right;Seated;10 reps (assisted stretching x15 sec each rep) Goniometric ROM: AROM 10-35 deg, AAROM 40 deg flexion Other Exercises Other Exercises: Prolonged gentle stretch to R knee into extension with lower leg resting on towel rolls and pt cued to allow posterior knee to sink towards bed, x~2 min hold.    General Comments        Pertinent Vitals/Pain Pain Assessment: 0-10 Pain Score: 6  Pain Location: R knee during mobility Pain Descriptors / Indicators: Grimacing;Guarding;Discomfort Pain Intervention(s): Limited activity within patient's tolerance;Monitored during session;Patient requesting pain meds-RN notified;RN gave pain meds during session    Home Living                      Prior Function            PT Goals (current goals can now be found in the care plan section) Acute Rehab PT Goals Patient Stated Goal: independence PT Goal Formulation: With patient Time For Goal Achievement: 10/19/20 Potential to Achieve Goals: Fair Progress towards PT goals: Progressing toward goals    Frequency    7X/week      PT Plan Current plan remains appropriate    Co-evaluation              AM-PAC PT "6 Clicks" Mobility   Outcome Measure  Help needed turning from your back to your side while in a flat bed without using bedrails?: A Little Help needed moving from lying on your back to sitting on the side of a flat bed without using bedrails?: A Little Help needed moving to and from a bed to a chair (including a wheelchair)?: A Lot Help needed standing up from a chair using your  arms (e.g., wheelchair or bedside chair)?: A Lot Help needed to walk in hospital room?: A Lot Help needed climbing 3-5 steps with a railing? : Total 6 Click Score: 13    End of Session Equipment Utilized During Treatment: Gait belt Activity Tolerance: Patient tolerated treatment well Patient left: with call bell/phone within reach;with family/visitor present;in chair;with chair alarm set Nurse Communication: Mobility status PT Visit Diagnosis: Muscle weakness (generalized) (M62.81);Difficulty in walking, not elsewhere classified (R26.2);Pain;Other symptoms and signs involving the nervous system (R29.898);Unsteadiness on feet (R26.81);Other abnormalities of gait and mobility (R26.89) Pain - Right/Left: Right Pain - part of body: Knee     Time: 0912-1005 PT Time Calculation (min) (ACUTE ONLY): 53 min  Charges:  $Gait Training: 8-22 mins $Therapeutic Exercise: 8-22 mins $Therapeutic Activity: 8-22 mins                     Leighton Roach, [place]  Pager [phone] Office [place] 10/08/2020, 1:38 PM

## 2020-10-08 NOTE — Progress Notes (Signed)
Physical Therapy Treatment Patient Details Name: Justin Rose MRN: [id number] DOB: 12-26-1933 Today's Date: 10/08/2020    History of Present Illness Pt is 86 yo male s/p R TKA 11/215/21. Post op events/symptoms documented MRI(+) Numerous small scattered acute cortical and subcortical infarcts within the bilateral frontal, parietal and occipital lobes. Findings are likely secondary to an embolic process.    PT Comments    Pt feeling very poorly this afternoon. C/o R medial knee pain. There is tenderness an redness in the area he states, red area marked with pen by therapist and RN notified. Pt also c/o back pain in area where he recently had surgery and neck pain. He relays extreme fatigue as well. Pt required mod A to stand from recliner. Mod A needed for sit>supine and positioning in CPM. CPM set 10-50 deg and pt tolerated with tightness last 5 deg. Controlled left with him in case he needed to turn off. CPM begun 1500. PT will continue to follow.     Follow Up Recommendations  CIR     Equipment Recommendations  Rolling walker with 5" wheels;3in1 (PT);Wheelchair (measurements PT)    Recommendations for Other Services Rehab consult     Precautions / Restrictions Precautions Precautions: Fall Required Braces or Orthoses: Knee Immobilizer - Right Knee Immobilizer - Right: Discontinue once straight leg raise with < 10 degree lag Restrictions Weight Bearing Restrictions: No RLE Weight Bearing: Weight bearing as tolerated    Mobility  Bed Mobility Overal bed mobility: Needs Assistance Bed Mobility: Sit to Supine     Supine to sit: Min assist Sit to supine: Mod assist   General bed mobility comments: mod A for RLE and positioning in CPM  Transfers Overall transfer level: Needs assistance Equipment used: Rolling walker (2 wheeled) Transfers: Sit to/from Stand Sit to Stand: Mod assist Stand pivot transfers: Mod assist       General transfer comment: needed mod A for power up  and fwd wt shift from recliner. Had difficulty stepping feet to bed with use of RW.   Ambulation/Gait Ambulation/Gait assistance: Min assist;Mod assist Gait Distance (Feet): 6 Feet Assistive device: Rolling walker (2 wheeled) Gait Pattern/deviations: Step-to pattern;Decreased weight shift to right;Trunk flexed Gait velocity: decreased Gait velocity interpretation: <1.31 ft/sec, indicative of household ambulator General Gait Details: pt became light headed at 34' and needed to return to sitting, vc's for posture   Stairs             Wheelchair Mobility    Modified Rankin (Stroke Patients Only) Modified Rankin (Stroke Patients Only) Pre-Morbid Rankin Score: No symptoms Modified Rankin: Moderately severe disability     Balance Overall balance assessment: Needs assistance Sitting-balance support: Feet supported;No upper extremity supported Sitting balance-Leahy Scale: Fair Sitting balance - Comments: fair with fatigue Postural control: Posterior lean Standing balance support: Bilateral upper extremity supported;During functional activity Standing balance-Leahy Scale: Fair Standing balance comment: reliant on UE support. Worked on erect posture and abdominal activation in standing. Pt has been having neck discomfort, noted forward head posture                            Cognition Arousal/Alertness: Awake/alert Behavior During Therapy: WFL for tasks assessed/performed Overall Cognitive Status: Impaired/Different from baseline Area of Impairment: Safety/judgement;Awareness;Memory                     Memory: Decreased short-term memory Following Commands: Follows one step commands with increased time Safety/Judgement: Decreased  awareness of safety;Decreased awareness of deficits Awareness: Emergent Problem Solving: Slow processing General Comments: pt having difficulty recalling sequence of day. When asked if he is ready to get out of chair, he states,  "you tell me if it's time". Encouraged to advocate for himself when asked and when he feels uncomfortable. RN had this comversation with him this morning as well in regard to his PRN meds      Exercises Total Joint Exercises Ankle Circles/Pumps: AROM;Both;20 reps;Supine Quad Sets: AROM;Right;Supine;10 reps Short Arc QuadSinclair Ship;Right;10 reps;Supine Heel Slides: AROM;Right;10 reps;AAROM Straight Leg Raises: AROM;Right;Left;10 reps Long Arc Quad: AROM;Right;10 reps;Seated Knee Flexion: AAROM;Right;Seated;10 reps (assisted stretching x15 sec each rep) Goniometric ROM: AROM 10-35 deg, AAROM 40 deg flexion Other Exercises Other Exercises: unable to complete single leg raise with multiple attempts. KI used throughout session    General Comments General comments (skin integrity, edema, etc.): pt very fatigued with pivot to bed and reports that medial knee pain was not present this AM.       Pertinent Vitals/Pain Pain Assessment: Faces Pain Score: 6  Faces Pain Scale: Hurts whole lot Pain Location: back and R medial knee Pain Descriptors / Indicators: Grimacing;Guarding;Discomfort Pain Intervention(s): Limited activity within patient's tolerance;Monitored during session    Home Living                      Prior Function            PT Goals (current goals can now be found in the care plan section) Acute Rehab PT Goals Patient Stated Goal: to be home by thanksgiving PT Goal Formulation: With patient Time For Goal Achievement: 10/19/20 Potential to Achieve Goals: Fair Progress towards PT goals: Not progressing toward goals - comment (R knee pain)    Frequency    7X/week      PT Plan Current plan remains appropriate    Co-evaluation              AM-PAC PT "6 Clicks" Mobility   Outcome Measure  Help needed turning from your back to your side while in a flat bed without using bedrails?: A Little Help needed moving from lying on your back to sitting on the  side of a flat bed without using bedrails?: A Little Help needed moving to and from a bed to a chair (including a wheelchair)?: A Lot Help needed standing up from a chair using your arms (e.g., wheelchair or bedside chair)?: A Lot Help needed to walk in hospital room?: A Lot Help needed climbing 3-5 steps with a railing? : Total 6 Click Score: 13    End of Session Equipment Utilized During Treatment: Gait belt Activity Tolerance: Patient limited by fatigue Patient left: with call bell/phone within reach;in bed;in CPM;with bed alarm set Nurse Communication: Mobility status;Other (comment) (CPM to come off before 7p (end of shift), redness R knee) PT Visit Diagnosis: Muscle weakness (generalized) (M62.81);Difficulty in walking, not elsewhere classified (R26.2);Pain;Other symptoms and signs involving the nervous system (R29.898);Unsteadiness on feet (R26.81);Other abnormalities of gait and mobility (R26.89) Pain - Right/Left: Right Pain - part of body: Knee     Time: 9758-8325 PT Time Calculation (min) (ACUTE ONLY): 30 min  Charges:  $Gait Training: 8-22 mins $Therapeutic Exercise: 8-22 mins $Therapeutic Activity: 23-37 mins                     Leighton Roach, PT  Acute Rehab Services  Pager [phone] Office [phone]    [place]  L Sherrilynn Gudgel 10/08/2020, 4:48 PM

## 2020-10-09 ENCOUNTER — Encounter (HOSPITAL_COMMUNITY): Payer: Self-pay | Admitting: Physical Medicine and Rehabilitation

## 2020-10-09 ENCOUNTER — Inpatient Hospital Stay (HOSPITAL_COMMUNITY)
Admission: RE | Admit: 2020-10-09 | Discharge: 2020-10-22 | DRG: 057 | Disposition: A | Discharge status: Home / Self Care | Payer: Medicare HMO | Source: Intra-hospital | Attending: Physical Medicine and Rehabilitation | Admitting: Physical Medicine and Rehabilitation

## 2020-10-09 ENCOUNTER — Encounter (HOSPITAL_COMMUNITY): Payer: Self-pay | Admitting: Orthopedic Surgery

## 2020-10-09 ENCOUNTER — Other Ambulatory Visit: Payer: Self-pay

## 2020-10-09 DIAGNOSIS — T466X5A Adverse effect of antihyperlipidemic and antiarteriosclerotic drugs, initial encounter: Secondary | ICD-10-CM | POA: Diagnosis present

## 2020-10-09 DIAGNOSIS — I951 Orthostatic hypotension: Secondary | ICD-10-CM | POA: Diagnosis not present

## 2020-10-09 DIAGNOSIS — D62 Acute posthemorrhagic anemia: Secondary | ICD-10-CM

## 2020-10-09 DIAGNOSIS — G479 Sleep disorder, unspecified: Secondary | ICD-10-CM | POA: Diagnosis present

## 2020-10-09 DIAGNOSIS — M1711 Unilateral primary osteoarthritis, right knee: Secondary | ICD-10-CM

## 2020-10-09 DIAGNOSIS — M170 Bilateral primary osteoarthritis of knee: Secondary | ICD-10-CM | POA: Diagnosis present

## 2020-10-09 DIAGNOSIS — I82441 Acute embolism and thrombosis of right tibial vein: Secondary | ICD-10-CM | POA: Diagnosis present

## 2020-10-09 DIAGNOSIS — R42 Dizziness and giddiness: Secondary | ICD-10-CM

## 2020-10-09 DIAGNOSIS — M609 Myositis, unspecified: Secondary | ICD-10-CM

## 2020-10-09 DIAGNOSIS — K5903 Drug induced constipation: Secondary | ICD-10-CM | POA: Diagnosis present

## 2020-10-09 DIAGNOSIS — D72829 Elevated white blood cell count, unspecified: Secondary | ICD-10-CM

## 2020-10-09 DIAGNOSIS — B964 Proteus (mirabilis) (morganii) as the cause of diseases classified elsewhere: Secondary | ICD-10-CM | POA: Diagnosis present

## 2020-10-09 DIAGNOSIS — R339 Retention of urine, unspecified: Secondary | ICD-10-CM

## 2020-10-09 DIAGNOSIS — R2689 Other abnormalities of gait and mobility: Secondary | ICD-10-CM | POA: Diagnosis present

## 2020-10-09 DIAGNOSIS — M1710 Unilateral primary osteoarthritis, unspecified knee: Principal | ICD-10-CM | POA: Diagnosis present

## 2020-10-09 DIAGNOSIS — B001 Herpesviral vesicular dermatitis: Secondary | ICD-10-CM | POA: Diagnosis present

## 2020-10-09 DIAGNOSIS — I6340 Cerebral infarction due to embolism of unspecified cerebral artery: Secondary | ICD-10-CM | POA: Diagnosis not present

## 2020-10-09 DIAGNOSIS — G8918 Other acute postprocedural pain: Secondary | ICD-10-CM | POA: Diagnosis not present

## 2020-10-09 DIAGNOSIS — I69398 Other sequelae of cerebral infarction: Secondary | ICD-10-CM

## 2020-10-09 DIAGNOSIS — I6389 Other cerebral infarction: Secondary | ICD-10-CM

## 2020-10-09 DIAGNOSIS — I69319 Unspecified symptoms and signs involving cognitive functions following cerebral infarction: Principal | ICD-10-CM

## 2020-10-09 DIAGNOSIS — M179 Osteoarthritis of knee, unspecified: Secondary | ICD-10-CM | POA: Diagnosis present

## 2020-10-09 DIAGNOSIS — E871 Hypo-osmolality and hyponatremia: Secondary | ICD-10-CM

## 2020-10-09 DIAGNOSIS — N390 Urinary tract infection, site not specified: Secondary | ICD-10-CM

## 2020-10-09 DIAGNOSIS — Z96651 Presence of right artificial knee joint: Secondary | ICD-10-CM | POA: Diagnosis present

## 2020-10-09 DIAGNOSIS — Z96659 Presence of unspecified artificial knee joint: Secondary | ICD-10-CM

## 2020-10-09 LAB — HEPATIC FUNCTION PANEL
ALT: 77 U/L — ABNORMAL HIGH (ref 0–44)
AST: 68 U/L — ABNORMAL HIGH (ref 15–41)
Albumin: 2.4 g/dL — ABNORMAL LOW (ref 3.5–5.0)
Alkaline Phosphatase: 72 U/L (ref 38–126)
Bilirubin, Direct: 0.2 mg/dL (ref 0.0–0.2)
Indirect Bilirubin: 1 mg/dL — ABNORMAL HIGH (ref 0.3–0.9)
Total Bilirubin: 1.2 mg/dL (ref 0.3–1.2)
Total Protein: 5.3 g/dL — ABNORMAL LOW (ref 6.5–8.1)

## 2020-10-09 LAB — CK: Total CK: 218 U/L (ref 49–397)

## 2020-10-09 MED ORDER — ACETAMINOPHEN 325 MG PO TABS
325.0000 mg | ORAL_TABLET | ORAL | Status: DC | PRN
  Administered 2020-10-10 – 2020-10-14 (×4): 650 mg via ORAL
  Filled 2020-10-09 (×4): qty 2

## 2020-10-09 MED ORDER — LIDOCAINE HCL URETHRAL/MUCOSAL 2 % EX GEL: 1.0000 "application " | CUTANEOUS | Status: DC | PRN

## 2020-10-09 MED ORDER — TIMOLOL MALEATE 0.5 % OP SOLN: 1.0000 [drp] | Freq: Every day | OPHTHALMIC | Status: DC

## 2020-10-09 MED ORDER — TRAZODONE HCL 50 MG PO TABS
25.0000 mg | ORAL_TABLET | Freq: Every evening | ORAL | Status: DC | PRN
  Administered 2020-10-11: 50 mg via ORAL
  Filled 2020-10-09 (×2): qty 1

## 2020-10-09 MED ORDER — LIP MEDEX EX OINT: TOPICAL_OINTMENT | CUTANEOUS | Status: DC | PRN

## 2020-10-09 MED ORDER — CLOPIDOGREL BISULFATE 75 MG PO TABS
75.0000 mg | ORAL_TABLET | Freq: Every day | ORAL | 0 refills | Status: DC
Start: 2020-10-10 — End: 2020-10-09

## 2020-10-09 MED ORDER — BISACODYL 10 MG RE SUPP
10.0000 mg | Freq: Every day | RECTAL | Status: DC | PRN
  Filled 2020-10-09: qty 1

## 2020-10-09 MED ORDER — POLYETHYLENE GLYCOL 3350 17 G PO PACK: 17.0000 g | PACK | Freq: Two times a day (BID) | ORAL | 0 refills | Status: AC

## 2020-10-09 MED ORDER — IPRATROPIUM BROMIDE 0.06 % NA SOLN
2.0000 | Freq: Every day | NASAL | Status: DC
  Administered 2020-10-10 – 2020-10-22 (×13): 2 via NASAL

## 2020-10-09 MED ORDER — ALUM & MAG HYDROXIDE-SIMETH 200-200-20 MG/5ML PO SUSP: 30.0000 mL | ORAL | Status: DC | PRN

## 2020-10-09 MED ORDER — CLOPIDOGREL BISULFATE 75 MG PO TABS
75.0000 mg | ORAL_TABLET | Freq: Every day | ORAL | Status: DC
  Administered 2020-10-10 – 2020-10-12 (×3): 75 mg via ORAL
  Filled 2020-10-09 (×3): qty 1

## 2020-10-09 MED ORDER — PANTOPRAZOLE SODIUM 40 MG PO TBEC
40.0000 mg | DELAYED_RELEASE_TABLET | Freq: Every day | ORAL | Status: DC
  Administered 2020-10-10 – 2020-10-22 (×13): 40 mg via ORAL
  Filled 2020-10-09 (×13): qty 1

## 2020-10-09 MED ORDER — POLYETHYLENE GLYCOL 3350 17 G PO PACK
17.0000 g | PACK | Freq: Every day | ORAL | Status: DC | PRN
  Administered 2020-10-10 – 2020-10-11 (×2): 17 g via ORAL
  Filled 2020-10-09 (×2): qty 1

## 2020-10-09 MED ORDER — PROCHLORPERAZINE EDISYLATE 10 MG/2ML IJ SOLN: 5.0000 mg | Freq: Four times a day (QID) | INTRAMUSCULAR | Status: DC | PRN

## 2020-10-09 MED ORDER — TRAMADOL HCL 50 MG PO TABS
50.0000 mg | ORAL_TABLET | Freq: Four times a day (QID) | ORAL | 0 refills | Status: DC | PRN
Start: 2020-10-09 — End: 2020-10-22

## 2020-10-09 MED ORDER — CLOPIDOGREL BISULFATE 75 MG PO TABS: 75.0000 mg | ORAL_TABLET | Freq: Every day | ORAL | 0 refills | Status: DC

## 2020-10-09 MED ORDER — TAMSULOSIN HCL 0.4 MG PO CAPS
0.4000 mg | ORAL_CAPSULE | Freq: Every day | ORAL | 0 refills | Status: DC
Start: 2020-10-10 — End: 2020-10-22

## 2020-10-09 MED ORDER — DIPHENHYDRAMINE HCL 12.5 MG/5ML PO ELIX
12.5000 mg | ORAL_SOLUTION | Freq: Four times a day (QID) | ORAL | Status: DC | PRN
  Administered 2020-10-13: 25 mg via ORAL
  Filled 2020-10-09: qty 10

## 2020-10-09 MED ORDER — METHOCARBAMOL 500 MG PO TABS: 500.0000 mg | ORAL_TABLET | Freq: Four times a day (QID) | ORAL | 0 refills | Status: DC | PRN

## 2020-10-09 MED ORDER — ASPIRIN 81 MG PO CHEW
81.0000 mg | CHEWABLE_TABLET | Freq: Every day | ORAL | 0 refills | Status: DC
Start: 2020-10-10 — End: 2020-10-22

## 2020-10-09 MED ORDER — ENSURE ENLIVE PO LIQD
237.0000 mL | Freq: Two times a day (BID) | ORAL | Status: DC
  Administered 2020-10-09 – 2020-10-21 (×15): 237 mL via ORAL

## 2020-10-09 MED ORDER — OXYCODONE HCL 5 MG PO TABS: 5.0000 mg | ORAL_TABLET | ORAL | Status: DC | PRN

## 2020-10-09 MED ORDER — ONDANSETRON HCL 4 MG PO TABS: 4.0000 mg | ORAL_TABLET | Freq: Four times a day (QID) | ORAL | 0 refills | Status: DC | PRN

## 2020-10-09 MED ORDER — TAMSULOSIN HCL 0.4 MG PO CAPS
0.4000 mg | ORAL_CAPSULE | Freq: Every day | ORAL | Status: DC
  Administered 2020-10-10 – 2020-10-22 (×13): 0.4 mg via ORAL
  Filled 2020-10-09 (×13): qty 1

## 2020-10-09 MED ORDER — POLYSACCHARIDE IRON COMPLEX 150 MG PO CAPS
150.0000 mg | ORAL_CAPSULE | Freq: Two times a day (BID) | ORAL | Status: DC
  Administered 2020-10-09 – 2020-10-16 (×15): 150 mg via ORAL
  Filled 2020-10-09 (×15): qty 1

## 2020-10-09 MED ORDER — CEPHALEXIN 500 MG PO CAPS: 500.0000 mg | ORAL_CAPSULE | Freq: Three times a day (TID) | ORAL | 0 refills | Status: DC

## 2020-10-09 MED ORDER — TRAMADOL HCL 50 MG PO TABS
50.0000 mg | ORAL_TABLET | Freq: Four times a day (QID) | ORAL | Status: DC | PRN
  Administered 2020-10-10: 50 mg via ORAL
  Administered 2020-10-12 – 2020-10-21 (×11): 100 mg via ORAL
  Filled 2020-10-09 (×4): qty 2
  Filled 2020-10-09: qty 1
  Filled 2020-10-09 (×4): qty 2
  Filled 2020-10-09: qty 1
  Filled 2020-10-09 (×3): qty 2
  Filled 2020-10-09: qty 1
  Filled 2020-10-09 (×2): qty 2

## 2020-10-09 MED ORDER — OXYCODONE HCL 5 MG PO TABS
5.0000 mg | ORAL_TABLET | Freq: Four times a day (QID) | ORAL | 0 refills | Status: DC | PRN
Start: 2020-10-09 — End: 2020-10-22

## 2020-10-09 MED ORDER — ENOXAPARIN SODIUM 40 MG/0.4ML ~~LOC~~ SOLN
40.0000 mg | SUBCUTANEOUS | Status: DC
  Administered 2020-10-09 – 2020-10-11 (×3): 40 mg via SUBCUTANEOUS
  Filled 2020-10-09 (×4): qty 0.4

## 2020-10-09 MED ORDER — PROCHLORPERAZINE 25 MG RE SUPP: 12.5000 mg | Freq: Four times a day (QID) | RECTAL | Status: DC | PRN

## 2020-10-09 MED ORDER — MENTHOL 3 MG MT LOZG: 1.0000 | LOZENGE | OROMUCOSAL | Status: DC | PRN

## 2020-10-09 MED ORDER — GUAIFENESIN-DM 100-10 MG/5ML PO SYRP: 5.0000 mL | ORAL_SOLUTION | Freq: Four times a day (QID) | ORAL | Status: DC | PRN

## 2020-10-09 MED ORDER — BRIMONIDINE TARTRATE 0.2 % OP SOLN
1.0000 [drp] | Freq: Every day | OPHTHALMIC | Status: DC
  Administered 2020-10-09 – 2020-10-21 (×13): 1 [drp] via OPHTHALMIC

## 2020-10-09 MED ORDER — LORATADINE 10 MG PO TABS
10.0000 mg | ORAL_TABLET | Freq: Every day | ORAL | Status: DC
  Administered 2020-10-10 – 2020-10-22 (×13): 10 mg via ORAL
  Filled 2020-10-09 (×13): qty 1

## 2020-10-09 MED ORDER — PROCHLORPERAZINE MALEATE 5 MG PO TABS: 5.0000 mg | ORAL_TABLET | Freq: Four times a day (QID) | ORAL | Status: DC | PRN

## 2020-10-09 MED ORDER — CEPHALEXIN 250 MG PO CAPS
500.0000 mg | ORAL_CAPSULE | Freq: Three times a day (TID) | ORAL | Status: AC
  Administered 2020-10-09 – 2020-10-12 (×9): 500 mg via ORAL
  Filled 2020-10-09 (×9): qty 2

## 2020-10-09 MED ORDER — FLEET ENEMA 7-19 GM/118ML RE ENEM: 1.0000 | ENEMA | Freq: Once | RECTAL | Status: DC | PRN

## 2020-10-09 MED ORDER — PHENOL 1.4 % MT LIQD: 1.0000 | OROMUCOSAL | Status: DC | PRN

## 2020-10-09 NOTE — Progress Notes (Signed)
Jamse Arn, MD  Physician  Physical Medicine and Rehabilitation  PMR Pre-admission      Signed  Date of Service:  10/02/2020  1:08 PM      Related encounter: Admission (Current) from 09/30/2020 in [facility]      Signed       PMR Admission Coordinator Pre-Admission Assessment   Patient: Justin Rose is an 86 y.o., male MRN: [id number] DOB: May 14, 1934 Height: '5\' 11"'  (180.3 cm) Weight: 85.5 kg   Insurance Information HMO:     PPO:yes      PCP:      IPA:      80/20:      OTHER:  PRIMARY: [employer] Medicare      Policy#: [id number]      Subscriber: patient CM Name: Tammy      Phone#: [phone]    Fax#: [phone] Pre-Cert#: [id number]      Employer:  Josem Kaufmann provided by Lynelle Smoke with [employer] appeals for admit to CIR. Pt is approved from 11/23-11/27 with recommendation to send updates on 11/26 (as 27th is Saturday). Anticipate clinicals updates will go to (f): [phone]  Benefits:  Phone #: online at [email or web address] provider portal, transaction ID [id number]     Name:  Eff. Date: 11/16/2014-11/15/2020     Deduct: $0 (does not have deductible)      Out of Pocket Max: $7,000 ($1,187.80 met)      Life Max: NA CIR: $275/day co-pay with a max-co-pay of $1,500/admission (4 days)      SNF: $0/day co-pay for days 1-20, $184/day co-pay for days 21-100; limited to 100 days  Outpatient: $35/visit co-pay; limited by medical necessity   Home Health: 100% coverage, 0% co-insurance; limited by medical necessity      DME: 80% coverage, 20% co-insruance     Providers:  SECONDARY: None      Policy#:      Phone#:    Development worker, community:       Phone#:    The Therapist, art Information Summary" for patients in Inpatient Rehabilitation Facilities with attached "Privacy Act [place] Records" was provided and verbally reviewed with: Patient   Emergency Contact Information         Contact Information     Name Relation Home Work Mobile    Mergen,BETTY Spouse [phone]    [place]'s Daughter     [phone]         Current Medical History  Patient Admitting Diagnosis:  R TKA complicated by 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    History of Present Illness: Pt is an 86 yo male with diagnosis of right knee osteoarthritis.Pt was admitted to [facility] on 11/15 for scheduled right total knee arthroplasty with Dr. Wynelle Link. Post op, pt developed onset of progressive frontal HA, floaters in his vision and discoordination. 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 The findings were felt to be likely secondary to an embolic process. Pt evaluated by therapies with recommendation for CIR. Pt is to admit to CIR on 10/09/20   Complete NIHSS TOTAL: 0   Patient's medical record from [facility]  has been reviewed by the rehabilitation admission coordinator and physician.   Past Medical History      Past Medical History:  Diagnosis Date  . Arthritis    . Basal cell carcinoma (BCC) of  head    . Basal cell carcinoma, ear      Left ear  . Glaucoma      Left eye      Family History   family history is not on file.   Prior Rehab/Hospitalizations Has the patient had prior rehab or hospitalizations prior to admission? No   Has the patient had major surgery during 100 days prior to admission? Yes              Current Medications   Current Facility-Administered Medications:  .  acetaminophen (TYLENOL) tablet 325-650 mg, 325-650 mg, Oral, Q6H PRN, Edmisten, Kristie L, PA .  aspirin chewable tablet 81 mg, 81 mg, Oral, Daily, Heard, Courtney S, NP, 81 mg at 10/09/20 0815 .  bisacodyl (DULCOLAX) suppository 10 mg, 10 mg, Rectal, Daily PRN, Edmisten, Kristie L, PA .  brimonidine (ALPHAGAN) 0.2 % ophthalmic solution 1 drop, 1 drop, Left Eye,  QHS, 1 drop at 10/08/20 2219 **AND** timolol (TIMOPTIC) 0.5 % ophthalmic solution 1 drop, 1 drop, Left Eye, QHS, Aluisio, Frank, MD .  clopidogrel (PLAVIX) tablet 75 mg, 75 mg, Oral, Daily, Heard, Courtney S, NP, 75 mg at 10/09/20 0815 .  diphenhydrAMINE (BENADRYL) 12.5 MG/5ML elixir 12.5-25 mg, 12.5-25 mg, Oral, Q4H PRN, Edmisten, Kristie L, PA .  ipratropium (ATROVENT) 0.06 % nasal spray 2 spray, 2 spray, Each Nare, Daily, Edmisten, Kristie L, PA, 2 spray at 10/09/20 0817 .  lidocaine (XYLOCAINE) 2 % jelly 1 application, 1 application, Urethral, Once, Opyd, Ilene Qua, MD .  lip balm (CARMEX) ointment, , Topical, PRN, Gaynelle Arabian, MD, Given at 10/09/20 [phone] .  loratadine (CLARITIN) tablet 10 mg, 10 mg, Oral, Daily, Edmisten, Kristie L, PA, 10 mg at 10/09/20 0816 .  menthol-cetylpyridinium (CEPACOL) lozenge 3 mg, 1 lozenge, Oral, PRN **OR** phenol (CHLORASEPTIC) mouth spray 1 spray, 1 spray, Mouth/Throat, PRN, Edmisten, Kristie L, PA .  methocarbamol (ROBAXIN) tablet 500 mg, 500 mg, Oral, Q6H PRN, 500 mg at 10/09/20 0815 **OR** methocarbamol (ROBAXIN) 500 mg in dextrose 5 % 50 mL IVPB, 500 mg, Intravenous, Q6H PRN, Edmisten, Kristie L, PA .  metoCLOPramide (REGLAN) tablet 5-10 mg, 5-10 mg, Oral, Q8H PRN, 10 mg at 10/08/20 1006 **OR** metoCLOPramide (REGLAN) injection 5-10 mg, 5-10 mg, Intravenous, Q8H PRN, Edmisten, Kristie L, PA .  metoprolol tartrate (LOPRESSOR) tablet 50 mg, 50 mg, Oral, Once, Norins, Michael E, MD .  morphine 2 MG/ML injection 0.5-1 mg, 0.5-1 mg, Intravenous, Q2H PRN, Edmisten, Kristie L, PA, 1 mg at 10/03/20 2253 .  ondansetron (ZOFRAN) tablet 4 mg, 4 mg, Oral, Q6H PRN **OR** ondansetron (ZOFRAN) injection 4 mg, 4 mg, Intravenous, Q6H PRN, Edmisten, Kristie L, PA, 4 mg at 10/01/20 0320 .  oxyCODONE (Oxy IR/ROXICODONE) immediate release tablet 5-10 mg, 5-10 mg, Oral, Q4H PRN, Edmisten, Kristie L, PA, 10 mg at 10/09/20 0815 .  pantoprazole (PROTONIX) EC tablet 40 mg, 40 mg, Oral,  Daily, Maurice March, PA-C, 40 mg at 10/09/20 0816 .  polyethylene glycol (MIRALAX / GLYCOLAX) packet 17 g, 17 g, Oral, Daily PRN, Edmisten, Kristie L, PA, 17 g at 10/06/20 0959 .  polyethylene glycol (MIRALAX / GLYCOLAX) packet 17 g, 17 g, Oral, BID, Paralee Cancel, MD, 17 g at 10/08/20 1006 .  psyllium (HYDROCIL/METAMUCIL) 1 packet, 1 packet, Oral, Daily, Dahal, Binaya, MD, 1 packet at 10/08/20 1006 .  senna-docusate (Senokot-S) tablet 1 tablet, 1 tablet, Oral, QHS, Dahal, Binaya, MD, 1 tablet at 10/07/20 2136 .  sodium phosphate (FLEET) 7-19 GM/118ML  enema 1 enema, 1 enema, Rectal, Once PRN, Edmisten, Kristie L, PA .  tamsulosin (FLOMAX) capsule 0.4 mg, 0.4 mg, Oral, Daily, Dahal, Binaya, MD, 0.4 mg at 10/09/20 0815 .  traMADol (ULTRAM) tablet 50-100 mg, 50-100 mg, Oral, Q6H PRN, Edmisten, Kristie L, PA, 50 mg at 10/08/20 2218   Patients Current Diet:     Diet Order                      Diet - low sodium heart healthy              Diet regular Room service appropriate? Yes; Fluid consistency: Thin  Diet effective now                      Precautions / Restrictions Precautions Precautions: Fall Restrictions Weight Bearing Restrictions: No RLE Weight Bearing: Weight bearing as tolerated Other Position/Activity Restrictions: WBAT    Has the patient had 2 or more falls or a fall with injury in the past year? No   Prior Activity Level Community (5-7x/wk): [place], drove, Independent PTA   Prior Functional Level Self Care: Did the patient need help bathing, dressing, using the toilet or eating? Independent   Indoor Mobility: Did the patient need assistance with walking from room to room (with or without device)? Independent   Stairs: Did the patient need assistance with internal or external stairs (with or without device)? Independent   Functional Cognition: Did the patient need help planning regular tasks such as shopping or remembering to take medications?  Independent   Home Assistive Devices / Equipment Home Assistive Devices/Equipment: (P) Eyeglasses, Wheelchair, Crutches, Environmental consultant (specify type), Hand-held shower hose, Blood pressure cuff Home Equipment: Walker - 2 wheels, Crutches, [place] - single point, Wheelchair - manual   Prior Device Use: Indicate devices/aids used by the patient prior to current illness, exacerbation or injury? None of the above   Current Functional Level Cognition   Arousal/Alertness: Awake/alert Overall Cognitive Status: Impaired/Different from baseline Orientation Level: Oriented X4 Following Commands: Follows one step commands with increased time Safety/Judgement: Decreased awareness of safety, Decreased awareness of deficits General Comments: pt having difficulty recalling sequence of day. When asked if he is ready to get out of chair, he states, "you tell me if it's time". Encouraged to advocate for himself when asked and when he feels uncomfortable. RN had this comversation with him this morning as well in regard to his PRN meds Attention: Selective Selective Attention: Impaired Selective Attention Impairment: Verbal basic Memory: Impaired Memory Impairment: Storage deficit Problem Solving: Impaired Problem Solving Impairment: Verbal complex    Extremity Assessment (includes Sensation/Coordination)   Upper Extremity Assessment: Generalized weakness RUE Deficits / Details: pt able to hold cup and take medication from cup but when holding RW tends to have hands sliding foward toward front of RW RUE Sensation: WNL RUE Coordination: WNL LUE Deficits / Details: unable to release from RW on command with transitional transfer LUE Sensation: WNL LUE Coordination: decreased gross motor (pt able to complete finger<>nose but not as quickly as Rt UE)  Lower Extremity Assessment: Defer to PT evaluation RLE Deficits / Details: no extensor lag with SLR RLE Sensation: WNL RLE Coordination: WNL LLE Deficits / Details:  WNL's LLE Sensation: WNL LLE Coordination: WNL     ADLs   Overall ADL's : Needs assistance/impaired Eating/Feeding: Set up, Sitting Eating/Feeding Details (indicate cue type and reason): pt insistign wife place egg salad sandwich on window for later  even with education that we can get him a fresh one for dinner. pt with poor intake of lunch Grooming: Oral care, Standing, Moderate assistance (eva walker) Grooming Details (indicate cue type and reason): OT helping hand pt all needed items and one step commands to complete oral task. OT holding basin for spitting Lower Body Dressing: Total assistance Toilet Transfer: Moderate assistance Toilet Transfer Details (indicate cue type and reason): simulated from chair Functional mobility during ADLs: Moderate assistance (eva walker) General ADL Comments: pt progressed with mod cues with eva walker to sink surface and back.      Mobility   Overal bed mobility: Needs Assistance Bed Mobility: Sit to Supine Supine to sit: Min assist Sit to supine: Mod assist General bed mobility comments: mod A for RLE and positioning in CPM     Transfers   Overall transfer level: Needs assistance Equipment used: Rolling walker (2 wheeled) Transfers: Sit to/from Stand Sit to Stand: Mod assist Stand pivot transfers: Mod assist General transfer comment: needed mod A for power up and fwd wt shift from recliner. Had difficulty stepping feet to bed with use of RW.      Ambulation / Gait / Stairs / Wheelchair Mobility   Ambulation/Gait Ambulation/Gait assistance: Min assist, Mod assist Gait Distance (Feet): 6 Feet Assistive device: Rolling walker (2 wheeled) Gait Pattern/deviations: Step-to pattern, Decreased weight shift to right, Trunk flexed General Gait Details: pt became light headed at 37' and needed to return to sitting, vc's for posture Gait velocity: decreased Gait velocity interpretation: <1.31 ft/sec, indicative of household ambulator     Posture /  Balance Dynamic Sitting Balance Sitting balance - Comments: fair with fatigue Balance Overall balance assessment: Needs assistance Sitting-balance support: Feet supported, No upper extremity supported Sitting balance-Leahy Scale: Fair Sitting balance - Comments: fair with fatigue Postural control: Posterior lean Standing balance support: Bilateral upper extremity supported, During functional activity Standing balance-Leahy Scale: Fair Standing balance comment: reliant on UE support. Worked on erect posture and abdominal activation in standing. Pt has been having neck discomfort, noted forward head posture     Special needs/care consideration Skin: surgical incision to RLE (TKA).   CPM orders in acute chart.     Previous Home Environment (from acute therapy documentation) Living Arrangements: Spouse/significant other Available Help at Discharge: Family Type of Home: House Home Layout: One level Home Access: Stairs to enter Entrance Stairs-Rails: Left Entrance Stairs-Number of Steps: 3 Bathroom Shower/Tub: Multimedia programmer: Handicapped height Bathroom Accessibility: Yes [place]: (P) No Additional Comments: wife named Environmental education officer   Discharge Living Setting Plans for Discharge Living Setting: House, Lives with (comment) (wife) Type of Home at Discharge: House Discharge Home Layout: One level Discharge Home Access: Stairs to enter Entrance Stairs-Rails: Left (grab bar) Entrance Stairs-Number of Steps: 3 steps then 1 step Discharge Bathroom Shower/Tub: Tub/shower unit, Walk-in shower Discharge Bathroom Toilet: Handicapped height Discharge Bathroom Accessibility: Yes How Accessible: Accessible via walker Does the patient have any problems obtaining your medications?: No   Social/Family/Support Systems Patient Roles: Spouse, Other (Comment) Licensed conveyancer) Contact Information: spouse: [name] [phone] Anticipated Caregiver: betty  Anticipated Caregiver's  Contact Information: see above Ability/Limitations of Caregiver: Min A Caregiver Availability: 24/7 Discharge Plan Discussed with Primary Caregiver: Yes (pt and Betty) Is Caregiver In Agreement with Plan?: Yes Does Caregiver/Family have Issues with Lodging/Transportation while Pt is in Rehab?: No   Goals Patient/Family Goal for Rehab: PT/OT: Supervision; SLP: NA Expected length of stay: 6-9 days Pt/Family Agrees to Admission  and willing to participate: Yes Program Orientation Provided & Reviewed with Pt/Caregiver Including Roles  & Responsibilities: Yes (pt and his wife)  Barriers to Discharge: Home environment access/layout  Barriers to Discharge Comments: steps to enter home   Decrease burden of Care through IP rehab admission: OtherNA   Possible need for SNF placement upon discharge: Not anticipated. Pt and family aware of plan for DC home after CIR stay. Anticipate Min A goals; family able to support Min A level at DC.    Patient Condition: I have reviewed medical records from [facility], spoken with RN, [facility] team, and patient and spouse. I met with patient at the bedside for inpatient rehabilitation assessment.  Patient will benefit from ongoing PT and OT, can actively participate in 3 hours of therapy a day 5 days of the week, and can make measurable gains during the admission.  Patient will also benefit from the coordinated team approach during an Inpatient Acute Rehabilitation admission.  The patient will receive intensive therapy as well as Rehabilitation physician, nursing, social worker, and care management interventions.  Due to safety, skin/wound care, disease management, medication administration, pain management and patient education the patient requires 24 hour a day rehabilitation nursing.  The patient is currently Mod A +2 for tranfers with mobility and Min/Mod A 6 feet for set up A to Mod A for basic ADLs.  Discharge setting and therapy post discharge at home  with home health is anticipated.  Patient has agreed to participate in the Acute Inpatient Rehabilitation Program and will admit 10/09/20.   Preadmission Screen Completed By:  Raechel Ache, 10/09/2020 12:17 PM ______________________________________________________________________   Discussed status with Dr. Posey Pronto  on 10/09/20 at 12:24PM and received approval for admission today.   Admission Coordinator:  Raechel Ache, OT, time 12:24PM/Date 10/09/20    Assessment/Plan:  Diagnosis: Right TKA  1. Does the need for close, 24 hr/day Medical supervision in concert with the patient's rehab needs make it unreasonable for this patient to be served in a less intensive setting? Yes 2. Co-Morbidities requiring supervision/potential complications: multifocal infarcts, transaminitis (repeat labs, avoid hepatotoxic meds), ABLA (repeat labs, consider transfusion if necessary to ensure appropriate perfusion for increased activity tolerance), post-op pain (Biofeedback training with therapies to help reduce reliance on opiate pain medications, monitor pain control during therapies, and sedation at rest and titrate to maximum efficacy to ensure participation and gains in therapies) 3. Due to bowel management, safety, skin/wound care, disease management, pain management and patient education, does the patient require 24 hr/day rehab nursing? Yes 4. Does the patient require coordinated care of a physician, rehab nurse, PT, OT to address physical and functional deficits in the context of the above medical diagnosis(es)? Yes Addressing deficits in the following areas: balance, endurance, locomotion, strength, transferring, bathing, dressing, toileting and psychosocial support 5. Can the patient actively participate in an intensive therapy program of at least 3 hrs of therapy 5 days a week? Yes 6. The potential for patient to make measurable gains while on inpatient rehab is excellent 7. Anticipated functional outcomes upon  discharge from inpatient rehab: supervision PT, supervision OT, n/a SLP 8. Estimated rehab length of stay to reach the above functional goals is: 12-16 days. 9. Anticipated discharge destination: Home 10. Overall Rehab/Functional Prognosis: excellent     MD Signature: Delice Lesch, MD, ABPMR        Revision History  Routing History           Note Details  Author Posey Pronto, Domenick Bookbinder, MD File Time 10/09/2020 12:28 PM  Author Type Physician Status Signed  Last Editor Jamse Arn, MD Service Physical Medicine and Rehabilitation

## 2020-10-09 NOTE — TOC Transition Note (Signed)
Transition of Care [facility]) - CM/SW Discharge Note   Patient Details  Name: Justin Rose MRN: [id number] Date of Birth: Mar 18, 1934  [facility] CM/SW Contact:  Pollie Friar, RN Phone Number: 10/09/2020, 12:35 PM   Clinical Narrative:    Pt is discharging to CIR today. CM signing off.   Final next level of care: IP Rehab Facility Barriers to Discharge: No Barriers Identified   Patient Goals and CMS Choice Patient states their goals for this hospitalization and ongoing recovery are:: to get better CMS Medicare.gov Compare Post Acute Care list provided to:: Patient Choice offered to / list presented to : Patient  Discharge Placement                       Discharge Plan and Services     Post Acute Care Choice: [place]          DME Arranged: N/A DME Agency: NA       HH Arranged: NA HH Agency: NA        Social Determinants of Health (SDOH) Interventions     Readmission Risk Interventions No flowsheet data found.

## 2020-10-09 NOTE — Progress Notes (Signed)
Physical Therapy Treatment Patient Details Name: Justin Rose MRN: [id number] DOB: 01/09/1934 Today's Date: 10/09/2020    History of Present Illness Pt is 86 yo male s/p R TKA 11/215/21. Post op events/symptoms documented MRI(+) Numerous small scattered acute cortical and subcortical infarcts within the bilateral frontal, parietal and occipital lobes. Findings are likely secondary to an embolic process.    PT Comments    Pt tolerates treatment well with improved activity tolerance and ambulation distance. Pt does continues to lack LE power during transfers, resulting in physical assistance requirements to transition from sit to stand. Pt will continue to benefit from acute PT POC to improve LE strength and ROM and to reduce falls risk. PT continues to recommend CIR placement at this time.   Follow Up Recommendations  CIR     Equipment Recommendations  Rolling walker with 5" wheels;3in1 (PT);Wheelchair (measurements PT)    Recommendations for Other Services       Precautions / Restrictions Precautions Precautions: Fall Restrictions Weight Bearing Restrictions: No RLE Weight Bearing: Weight bearing as tolerated    Mobility  Bed Mobility Overal bed mobility: Needs Assistance Bed Mobility: Sit to Supine       Sit to supine: Supervision   General bed mobility comments: increased time, use of rails  Transfers Overall transfer level: Needs assistance Equipment used: Rolling walker (2 wheeled) Transfers: Sit to/from Stand Sit to Stand: Mod assist         General transfer comment: cues for hand placement and forward trunk lean  Ambulation/Gait Ambulation/Gait assistance: Min assist Gait Distance (Feet): 20 Feet (additional trial of 12') Assistive device: Rolling walker (2 wheeled) Gait Pattern/deviations: Step-to pattern Gait velocity: decreased Gait velocity interpretation: <1.31 ft/sec, indicative of household ambulator General Gait Details: pt with increased trunk  flexion, reduced terminal knee extension bilaterally, reduced step and stride length   Stairs             Wheelchair Mobility    Modified Rankin (Stroke Patients Only) Modified Rankin (Stroke Patients Only) Pre-Morbid Rankin Score: No symptoms Modified Rankin: Moderately severe disability     Balance Overall balance assessment: Needs assistance Sitting-balance support: No upper extremity supported;Feet supported Sitting balance-Leahy Scale: Good     Standing balance support: Bilateral upper extremity supported Standing balance-Leahy Scale: Poor Standing balance comment: reliant on UE support of RW                            Cognition Arousal/Alertness: Awake/alert Behavior During Therapy: WFL for tasks assessed/performed Overall Cognitive Status: Impaired/Different from baseline Area of Impairment: Safety/judgement;Awareness                         Safety/Judgement: Decreased awareness of deficits;Decreased awareness of safety Awareness: Emergent          Exercises Total Joint Exercises Goniometric ROM: R knee flexion 40 AROM, extension -9 AROM Other Exercises Other Exercises: AROM and PROM RLE dangling exercise while seated in recliner, 3 reps of 30 second to 1 minute holds    General Comments General comments (skin integrity, edema, etc.): VSS on RA, pt left in CPM 5-50 degrees at 1230      Pertinent Vitals/Pain Pain Assessment: Faces Faces Pain Scale: Hurts even more Pain Location: R knee, neck Pain Descriptors / Indicators: Aching Pain Intervention(s): Monitored during session    Home Living  Prior Function            PT Goals (current goals can now be found in the care plan section) Acute Rehab PT Goals Patient Stated Goal: to go to rehab Progress towards PT goals: Progressing toward goals    Frequency    7X/week      PT Plan Current plan remains appropriate    Co-evaluation               AM-PAC PT "6 Clicks" Mobility   Outcome Measure  Help needed turning from your back to your side while in a flat bed without using bedrails?: A Little Help needed moving from lying on your back to sitting on the side of a flat bed without using bedrails?: A Little Help needed moving to and from a bed to a chair (including a wheelchair)?: A Little Help needed standing up from a chair using your arms (e.g., wheelchair or bedside chair)?: A Lot Help needed to walk in hospital room?: A Little Help needed climbing 3-5 steps with a railing? : Total 6 Click Score: 15    End of Session Equipment Utilized During Treatment: Gait belt Activity Tolerance: Patient tolerated treatment well Patient left: in bed;with call bell/phone within reach;with bed alarm set Nurse Communication: Mobility status PT Visit Diagnosis: Muscle weakness (generalized) (M62.81);Difficulty in walking, not elsewhere classified (R26.2);Pain;Other symptoms and signs involving the nervous system (R29.898);Unsteadiness on feet (R26.81);Other abnormalities of gait and mobility (R26.89) Pain - Right/Left: Right Pain - part of body: Knee     Time: 1200-1232 PT Time Calculation (min) (ACUTE ONLY): 32 min  Charges:  $Gait Training: 8-22 mins $Therapeutic Activity: 8-22 mins                     Zenaida Niece, PT, DPT Acute Rehabilitation Pager: [phone]    Zenaida Niece 10/09/2020, 1:39 PM

## 2020-10-09 NOTE — Progress Notes (Signed)
   Subjective: 9 Days Post-Op Procedure(s) (LRB): TOTAL KNEE ARTHROPLASTY (Right) Patient reports pain as mild.   Plan is to go rehab vs home after hospital stay.  Objective: Vital signs in last 24 hours: Temp:  [97.9 F (36.6 C)-98.6 F (37 C)] 98.3 F (36.8 C) (11/24 0325) Pulse Rate:  [83-102] 85 (11/24 0325) Resp:  [16-20] 18 (11/24 0325) BP: (109-126)/(69-91) 110/79 (11/24 0325) SpO2:  [95 %-99 %] 95 % (11/24 0325)  Intake/Output from previous day:  Intake/Output Summary (Last 24 hours) at 10/09/2020 0652 Last data filed at 10/09/2020 0500 Gross per 24 hour  Intake --  Output 2550 ml  Net -2550 ml    Intake/Output this shift: Total I/O In: -  Out: 1550 [Urine:1550]  Labs: Recent Labs    10/06/20 0819 10/08/20 0310  HGB 9.4* 8.7*   Recent Labs    10/06/20 0819 10/08/20 0310  WBC 9.6 9.1  RBC 2.99* 2.77*  HCT 28.1* 26.1*  PLT 247 307   Recent Labs    10/06/20 0819 10/08/20 0310  NA 136 135  K 3.6 3.8  CL 101 104  CO2 25 24  BUN 20 17  CREATININE 0.72 0.70  GLUCOSE 129* 115*  CALCIUM 8.4* 8.2*   No results for input(s): LABPT, INR in the last 72 hours.  EXAM General - Patient is Alert, Appropriate and Oriented Extremity - Neurologically intact Neurovascular intact No cellulitis present Compartment soft Dressing/Incision - Mild bloody drainage on Aquacel. Unchanged Motor Function - intact, moving foot and toes well on exam.   Past Medical History:  Diagnosis Date  . Arthritis   . Basal cell carcinoma (BCC) of head   . Basal cell carcinoma, ear    Left ear  . Glaucoma    Left eye    Assessment/Plan: 9 Days Post-Op Procedure(s) (LRB): TOTAL KNEE ARTHROPLASTY (Right) Principal Problem:   Arterial ischemic stroke, multifocal, multiple vascular territories, acute (HCC) Active Problems:   OA (osteoarthritis) of knee   Primary osteoarthritis of right knee   Discoordination   Primary osteoarthritis of knee   Up with therapy   Await determination on   DVT Prophylaxis - Aspirin and Plavix Weight-Bearing as tolerated to right leg  Gaynelle Arabian 10/09/2020, 6:52 AM

## 2020-10-09 NOTE — Progress Notes (Signed)
Inpatient Rehabilitation Medication Review by a Pharmacist  A complete drug regimen review was completed for this patient to identify any potential clinically significant medication issues.  Clinically significant medication issues were identified:  yes   Type of Medication Issue Identified Description of Issue Urgent (address now) Non-Urgent (address on AM team rounds) Plan Plan Accepted by Provider? (Yes / No / Pending AM Rounds)  Drug Interaction(s) (clinically significant)       Duplicate Therapy       Allergy       No Medication Administration End Date       Incorrect Dose       Additional Drug Therapy Needed  ASA + plavix until 12/8 then ASA mono. Only plavix ordered on tx. Robaxin and B12 not ordered Non-urgent Will message in AM   Other         Name of provider notified for urgent issues identified: Rehab team  Provider Method of Notification: secure chat   For non-urgent medication issues to be resolved on team rounds tomorrow morning a [facility] [place] was sent to:    Pharmacist comments: Will contact team in AM for issue  Time spent performing this drug regimen review (minutes):  [place] 10/09/2020 5:40 PM

## 2020-10-09 NOTE — Progress Notes (Signed)
PROGRESS NOTE    Justin Rose  EVO:[id number] DOB: Mar 31, 1934 DOA: 09/30/2020 PCP: Karlene Einstein, MD   Brief Narrative:  86 year old with history of arthritis admitted for elective right total knee replacement on 11/15.  Complicated by multifocal embolic CVA transferred to [facility] for further care.  Seen by neurology who recommended aspirin Plavix for 3 weeks followed by aspirin alone.  Also developed UTI therefore initially on IV Rocephin transition to Keflex.   Assessment & Plan:   Principal Problem:   Arterial ischemic stroke, multifocal, multiple vascular territories, acute ([place]) Active Problems:   OA (osteoarthritis) of knee   Primary osteoarthritis of right knee   Discoordination   Primary osteoarthritis of knee   Urinary retention   Acute lower UTI   Acute blood loss anemia   Dizziness and giddiness   Drug induced constipation   Acute multiple territory ischemic stroke -MRI with numerous small scattered acute cortical and subcortical infarcts within the bilateral frontal, parietal and occipital lobes. CTA head and neck did not show any significant carotid or vertebral artery stenosis. -LDL 74, A1c 5.6. -Unclear etiology of stroke, likely embolic stroke of undetermined origin. -Patient did not want implantable loop recorder. Plan is to discharge him with 30-day cardiac event monitor -TTE with EF 60 to 65%, normal LV function, no atrial level shunt and no intracardiac source. -PT/OT evaluation obtained.  CIR recommended.   -Aspirin Plavix for 3 weeks followed by aspirin alone.  Follow-up outpatient neurology in 4 weeks -Intolerant to statin as he developed rhabdomyolysis.  Osteoarthritis status post right total knee:  -Underwent total knee replacement on 11/15. PT recommended CIR but he was denied therefore appeal in process.  Statin induced myositis, resolved -Suspect it was induced by statin therefore discontinued.  Now resolved.  UTI with Proteus  mirabilis Acute urinary retention -Doing well urine cultures grew Proteus mirabilis.  Will transition IV Rocephin to p.o. Keflex for 3 more days.  Last day 10/12/2020    Body mass index is 26.3 kg/m.     Subjective: Overall doing well no complaints at this time.  Review of Systems Otherwise negative except as per HPI, including: General: Denies fever, chills, night sweats or unintended weight loss. Resp: Denies cough, wheezing, shortness of breath. Cardiac: Denies chest pain, palpitations, orthopnea, paroxysmal nocturnal dyspnea. GI: Denies abdominal pain, nausea, vomiting, diarrhea or constipation GU: Denies dysuria, frequency, hesitancy or incontinence MS: Denies muscle aches, joint pain or swelling Neuro: Denies headache, neurologic deficits (focal weakness, numbness, tingling), abnormal gait Psych: Denies anxiety, depression, SI/HI/AVH Skin: Denies new rashes or lesions ID: Denies sick contacts, exotic exposures, travel  Examination:  General exam: Appears calm and comfortable  Respiratory system: Clear to auscultation. Respiratory effort normal. Cardiovascular system: S1 & S2 heard, RRR. No JVD, murmurs, rubs, gallops or clicks. No pedal edema. Gastrointestinal system: Abdomen is nondistended, soft and nontender. No organomegaly or masses felt. Normal bowel sounds heard. Central nervous system: Alert and oriented. No focal neurological deficits. Extremities: Symmetric 5 x 5 power. Skin: No rashes, lesions or ulcers, right hip dressing in place Psychiatry: Judgement and insight appear normal. Mood & affect appropriate.     Objective: Vitals:   10/08/20 2041 10/08/20 2338 10/09/20 0325 10/09/20 0810  BP: 109/76 114/69 110/79 106/68  Pulse: 97 83 85 96  Resp: 19 18 18 18   Temp: 98.6 F (37 C) 98.6 F (37 C) 98.3 F (36.8 C) 98.4 F (36.9 C)  TempSrc: Oral Oral Oral Oral  SpO2: 97%  96% 95% 97%  Weight:      Height:        Intake/Output Summary (Last 24 hours)  at 10/09/2020 1128 Last data filed at 10/09/2020 0900 Gross per 24 hour  Intake 240 ml  Output 2550 ml  Net -2310 ml   Filed Weights   09/30/20 0543  Weight: 85.5 kg     Data Reviewed:   CBC: Recent Labs  Lab 10/03/20 0256 10/06/20 0819 10/08/20 0310  WBC 10.7* 9.6 9.1  NEUTROABS  --  7.2 6.4  HGB 10.1* 9.4* 8.7*  HCT 30.4* 28.1* 26.1*  MCV 95.9 94.0 94.2  PLT 168 247 789   Basic Metabolic Panel: Recent Labs  Lab 10/03/20 0256 10/06/20 0819 10/08/20 0310  NA 140 136 135  K 4.0 3.6 3.8  CL 105 101 104  CO2 25 25 24   GLUCOSE 136* 129* 115*  BUN 17 20 17   CREATININE 0.69 0.72 0.70  CALCIUM 8.9 8.4* 8.2*  MG  --  2.1  --   PHOS  --  3.2  --    GFR: Estimated Creatinine Clearance: 73.2 mL/min (by C-G formula based on SCr of 0.7 mg/dL). Liver Function Tests: Recent Labs  Lab 10/06/20 0819 10/08/20 0310 10/09/20 0337  AST 62* 100* 68*  ALT 43 83* 77*  ALKPHOS 60 74 72  BILITOT 1.1 1.2 1.2  PROT 5.8* 5.2* 5.3*  ALBUMIN 2.5* 2.3* 2.4*   No results for input(s): LIPASE, AMYLASE in the last 168 hours. No results for input(s): AMMONIA in the last 168 hours. Coagulation Profile: No results for input(s): INR, PROTIME in the last 168 hours. Cardiac Enzymes: Recent Labs  Lab 10/06/20 0819 10/07/20 0230 10/08/20 0310 10/09/20 0337  CKTOTAL 990* 927* 518* 218   BNP (last 3 results) No results for input(s): PROBNP in the last 8760 hours. HbA1C: No results for input(s): HGBA1C in the last 72 hours. CBG: No results for input(s): GLUCAP in the last 168 hours. Lipid Profile: No results for input(s): CHOL, HDL, LDLCALC, TRIG, CHOLHDL, LDLDIRECT in the last 72 hours. Thyroid Function Tests: No results for input(s): TSH, T4TOTAL, FREET4, T3FREE, THYROIDAB in the last 72 hours. Anemia Panel: No results for input(s): VITAMINB12, FOLATE, FERRITIN, TIBC, IRON, RETICCTPCT in the last 72 hours. Sepsis Labs: No results for input(s): PROCALCITON, LATICACIDVEN in  the last 168 hours.  Recent Results (from the past 240 hour(s))  Culture, Urine     Status: Abnormal   Collection Time: 10/05/20  3:53 AM   Specimen: Urine, Clean Catch  Result Value Ref Range Status   Specimen Description URINE, CLEAN CATCH  Final   Special Requests   Final    NONE Performed at [facility], [address], [place], [place] [address]    Culture >=100,000 COLONIES/mL PROTEUS MIRABILIS (A)  Final   Report Status 10/07/2020 FINAL  Final   Organism ID, Bacteria PROTEUS MIRABILIS (A)  Final      Susceptibility   Proteus mirabilis - MIC*    AMPICILLIN <=2 SENSITIVE Sensitive     CEFAZOLIN <=4 SENSITIVE Sensitive     CEFEPIME <=0.12 SENSITIVE Sensitive     CEFTRIAXONE <=0.25 SENSITIVE Sensitive     CIPROFLOXACIN <=0.25 SENSITIVE Sensitive     GENTAMICIN <=1 SENSITIVE Sensitive     IMIPENEM 2 SENSITIVE Sensitive     NITROFURANTOIN 128 RESISTANT Resistant     TRIMETH/SULFA <=20 SENSITIVE Sensitive     AMPICILLIN/SULBACTAM <=2 SENSITIVE Sensitive     PIP/TAZO <=4  SENSITIVE Sensitive     * >=100,000 COLONIES/mL PROTEUS MIRABILIS         Radiology Studies: No results found.      Scheduled Meds: . aspirin  81 mg Oral Daily  . brimonidine  1 drop Left Eye QHS   And  . timolol  1 drop Left Eye QHS  . clopidogrel  75 mg Oral Daily  . ipratropium  2 spray Each Nare Daily  . lidocaine  1 application Urethral Once  . loratadine  10 mg Oral Daily  . metoprolol tartrate  50 mg Oral Once  . pantoprazole  40 mg Oral Daily  . polyethylene glycol  17 g Oral BID  . psyllium  1 packet Oral Daily  . senna-docusate  1 tablet Oral QHS  . tamsulosin  0.4 mg Oral Daily   Continuous Infusions: . methocarbamol (ROBAXIN) IV       LOS: 8 days   Time spent= 15 mins    Manvir Prabhu Arsenio Loader, MD [facility]  If 7PM-7AM, please contact night-coverage  10/09/2020, 11:28 AM

## 2020-10-09 NOTE — Discharge Summary (Signed)
Physician Discharge Summary   Patient ID: Justin Rose MRN: [id number] DOB/AGE: 84-Aug-1935 84 y.o.  Admit date: 09/30/2020 Discharge date: 10/09/2020  Primary Diagnosis: Osteoarthritis, right knee   Admission Diagnoses:  Past Medical History:  Diagnosis Date  . Arthritis   . Basal cell carcinoma (BCC) of head   . Basal cell carcinoma, ear    Left ear  . Glaucoma    Left eye   Discharge Diagnoses:   Principal Problem:   Arterial ischemic stroke, multifocal, multiple vascular territories, acute ([place]) Active Problems:   OA (osteoarthritis) of knee   Primary osteoarthritis of right knee   Discoordination   Primary osteoarthritis of knee   Urinary retention   Acute lower UTI   Acute blood loss anemia   Dizziness and giddiness   Drug induced constipation  Estimated body mass index is 26.3 kg/m as calculated from the following:   Height as of this encounter: 5\' 11"  (1.803 m).   Weight as of this encounter: 85.5 kg.  Procedure:  Procedure(s) (LRB): TOTAL KNEE ARTHROPLASTY (Right)   Consults: Hospitalists and Neurology  HPI: Justin Rose is a 86 y.o. year old male with end stage OA of his right knee with progressively worsening pain and dysfunction. He has constant pain, with activity and at rest and significant functional deficits with difficulties even with ADLs. He has had extensive non-op management including analgesics, injections of cortisone and viscosupplements, and home exercise program, but remains in significant pain with significant dysfunction. Radiographs show bone on bone arthritis medial and patellofemoral. He presents now for right Total Knee Arthroplasty.    Laboratory Data: Admission on 09/30/2020  Component Date Value Ref Range Status  . WBC 10/01/2020 15.7* 4.0 - 10.5 K/uL Final  . RBC 10/01/2020 3.71* 4.22 - 5.81 MIL/uL Final  . Hemoglobin 10/01/2020 11.5* 13.0 - 17.0 g/dL Final  . HCT 10/01/2020 35.1* 39 - 52 % Final  . MCV 10/01/2020 94.6  80.0 - 100.0  fL Final  . MCH 10/01/2020 31.0  26.0 - 34.0 pg Final  . MCHC 10/01/2020 32.8  30.0 - 36.0 g/dL Final  . RDW 10/01/2020 12.9  11.5 - 15.5 % Final  . Platelets 10/01/2020 203  150 - 400 K/uL Final  . nRBC 10/01/2020 0.0  0.0 - 0.2 % Final   Performed at [facility], [place] [address], [place], [place] [address]  . Sodium 10/01/2020 137  135 - 145 mmol/L Final  . Potassium 10/01/2020 4.1  3.5 - 5.1 mmol/L Final  . Chloride 10/01/2020 105  98 - 111 mmol/L Final  . CO2 10/01/2020 20* 22 - 32 mmol/L Final  . Glucose, Bld 10/01/2020 173* 70 - 99 mg/dL Final   Glucose reference range applies only to samples taken after fasting for at least 8 hours.  . BUN 10/01/2020 16  8 - 23 mg/dL Final  . Creatinine, Ser 10/01/2020 0.81  0.61 - 1.24 mg/dL Final  . Calcium 10/01/2020 9.0  8.9 - 10.3 mg/dL Final  . GFR, Estimated 10/01/2020 >60  >60 mL/min Final   Comment: (NOTE) Calculated using the CKD-EPI Creatinine Equation (2021)   . Anion gap 10/01/2020 12  5 - 15 Final   Performed at [facility], [place] [address] [place], [place] [address]  . Glucose-Capillary 10/01/2020 167* 70 - 99 mg/dL Final   Glucose reference range applies only to samples taken after fasting for at least 8 hours.  . Troponin I (High Sensitivity) 10/01/2020 4  <18 ng/L  Final   Comment: (NOTE) Elevated high sensitivity troponin I (hsTnI) values and significant  changes across serial measurements may suggest ACS but many other  chronic and acute conditions are known to elevate hsTnI results.  Refer to the "Links" section for chest pain algorithms and additional  guidance. Performed at [facility], [place] [address], [place], [place] [address]   . Troponin I (High Sensitivity) 10/01/2020 6  <18 ng/L Final   Comment: (NOTE) Elevated high sensitivity troponin I (hsTnI) values and significant  changes across serial measurements may suggest ACS but many other  chronic and acute  conditions are known to elevate hsTnI results.  Refer to the "Links" section for chest pain algorithms and additional  guidance. Performed at [facility], [place] [address] [place], [place] [address]   . Troponin I (High Sensitivity) 10/01/2020 10  <18 ng/L Final   Comment: (NOTE) Elevated high sensitivity troponin I (hsTnI) values and significant  changes across serial measurements may suggest ACS but many other  chronic and acute conditions are known to elevate hsTnI results.  Refer to the "Links" section for chest pain algorithms and additional  guidance. Performed at [facility], [place] [address] [place], [place] [address]   . Hgb A1c MFr Bld 10/01/2020 5.6  4.8 - 5.6 % Final   Comment: (NOTE) Pre diabetes:          5.7%-6.4%  Diabetes:              >6.4%  Glycemic control for   <7.0% adults with diabetes   . Mean Plasma Glucose 10/01/2020 114.02  mg/dL Final   Performed at [place] [address], [place], [place] [address]  . Cholesterol 10/01/2020 115  0 - 200 mg/dL Final  . Triglycerides 10/01/2020 75  <150 mg/dL Final  . HDL 10/01/2020 26* >40 mg/dL Final  . Total CHOL/HDL Ratio 10/01/2020 4.4  RATIO Final  . VLDL 10/01/2020 15  0 - 40 mg/dL Final  . LDL Cholesterol 10/01/2020 74  0 - 99 mg/dL Final   Comment:        Total Cholesterol/HDL:CHD Risk Coronary Heart Disease Risk Table                     Men   Women  1/2 Average Risk   3.4   3.3  Average Risk       5.0   4.4  2 X Average Risk   9.6   7.1  3 X Average Risk  23.4   11.0        Use the calculated Patient Ratio above and the CHD Risk Table to determine the patient's CHD Risk.        ATP III CLASSIFICATION (LDL):  <100     mg/dL   Optimal  100-129  mg/dL   Near or Above                    Optimal  130-159  mg/dL   Borderline  160-189  mg/dL   High  >190     mg/dL   Very High Performed at [place] [address],  [place], [place] [address]   . Weight 10/01/2020 3,017.59  oz Final  . Height 10/01/2020 71  in Final  . BP 10/01/2020 126/83  mmHg Final  . S' Lateral 10/01/2020 2.86  cm Final  . Area-P 1/2 10/01/2020 2.73  cm2 Final  .  TSH 10/01/2020 0.397  0.350 - 4.500 uIU/mL Final   Comment: Performed by a 3rd Generation assay with a functional sensitivity of <=0.01 uIU/mL. Performed at [facility], [place] [address], [place], [place] [address]   . WBC 10/03/2020 10.7* 4.0 - 10.5 K/uL Final  . RBC 10/03/2020 3.17* 4.22 - 5.81 MIL/uL Final  . Hemoglobin 10/03/2020 10.1* 13.0 - 17.0 g/dL Final  . HCT 10/03/2020 30.4* 39 - 52 % Final  . MCV 10/03/2020 95.9  80.0 - 100.0 fL Final  . MCH 10/03/2020 31.9  26.0 - 34.0 pg Final  . MCHC 10/03/2020 33.2  30.0 - 36.0 g/dL Final  . RDW 10/03/2020 13.3  11.5 - 15.5 % Final  . Platelets 10/03/2020 168  150 - 400 K/uL Final  . nRBC 10/03/2020 0.0  0.0 - 0.2 % Final   Performed at [place] [address] [place], [place] [address]  . Sodium 10/03/2020 140  135 - 145 mmol/L Final  . Potassium 10/03/2020 4.0  3.5 - 5.1 mmol/L Final  . Chloride 10/03/2020 105  98 - 111 mmol/L Final  . CO2 10/03/2020 25  22 - 32 mmol/L Final  . Glucose, Bld 10/03/2020 136* 70 - 99 mg/dL Final   Glucose reference range applies only to samples taken after fasting for at least 8 hours.  . BUN 10/03/2020 17  8 - 23 mg/dL Final  . Creatinine, Ser 10/03/2020 0.69  0.61 - 1.24 mg/dL Final  . Calcium 10/03/2020 8.9  8.9 - 10.3 mg/dL Final  . GFR, Estimated 10/03/2020 >60  >60 mL/min Final   Comment: (NOTE) Calculated using the CKD-EPI Creatinine Equation (2021)   . Anion gap 10/03/2020 10  5 - 15 Final   Performed at [facility], [place] [address], [place], [place] [address]  . Color, Urine 10/05/2020 AMBER* YELLOW Final   BIOCHEMICALS MAY BE AFFECTED BY COLOR  . APPearance 10/05/2020 CLOUDY* CLEAR Final  . Specific Gravity, Urine 10/05/2020 1.015  1.005  - 1.030 Final  . pH 10/05/2020 9.0* 5.0 - 8.0 Final  . Glucose, UA 10/05/2020 NEGATIVE  NEGATIVE mg/dL Final  . Hgb urine dipstick 10/05/2020 NEGATIVE  NEGATIVE Final  . Bilirubin Urine 10/05/2020 NEGATIVE  NEGATIVE Final  . Ketones, ur 10/05/2020 5* NEGATIVE mg/dL Final  . Protein, ur 10/05/2020 >=300* NEGATIVE mg/dL Final  . Nitrite 10/05/2020 NEGATIVE  NEGATIVE Final  . Chalmers Guest 10/05/2020 LARGE* NEGATIVE Final  . WBC, UA 10/05/2020 6-10  0 - 5 WBC/hpf Final  . Bacteria, UA 10/05/2020 RARE* NONE SEEN Final  . Mucus 10/05/2020 PRESENT   Final  . Triple Phosphate Crystal 10/05/2020 PRESENT   Final  . Non Squamous Epithelial 10/05/2020 6-10* NONE SEEN Final   Performed at [facility], [place] [address], [place], [place] [address]  . Specimen Description 10/05/2020 URINE, CLEAN CATCH   Final  . Special Requests 10/05/2020    Final                   Value:NONE Performed at [facility], [place] [address], [place],  [address]   . Culture 10/05/2020 >=100,000 COLONIES/mL PROTEUS MIRABILIS*  Final  . Report Status 10/05/2020 10/07/2020 FINAL   Final  . Organism ID, Bacteria 10/05/2020 PROTEUS MIRABILIS*  Final  . Sodium 10/06/2020 136  135 - 145 mmol/L Final  . Potassium 10/06/2020 3.6  3.5 - 5.1 mmol/L Final  . Chloride 10/06/2020 101  98 - 111 mmol/L  Final  . CO2 10/06/2020 25  22 - 32 mmol/L Final  . Glucose, Bld 10/06/2020 129* 70 - 99 mg/dL Final   Glucose reference range applies only to samples taken after fasting for at least 8 hours.  . BUN 10/06/2020 20  8 - 23 mg/dL Final  . Creatinine, Ser 10/06/2020 0.72  0.61 - 1.24 mg/dL Final  . Calcium 10/06/2020 8.4* 8.9 - 10.3 mg/dL Final  . GFR, Estimated 10/06/2020 >60  >60 mL/min Final   Comment: (NOTE) Calculated using the CKD-EPI Creatinine Equation (2021)   . Anion gap 10/06/2020 10  5 - 15 Final   Performed at [facility], [place] [address], [place], [place] [address]  . WBC 10/06/2020 9.6  4.0 - 10.5  K/uL Final  . RBC 10/06/2020 2.99* 4.22 - 5.81 MIL/uL Final  . Hemoglobin 10/06/2020 9.4* 13.0 - 17.0 g/dL Final  . HCT 10/06/2020 28.1* 39 - 52 % Final  . MCV 10/06/2020 94.0  80.0 - 100.0 fL Final  . MCH 10/06/2020 31.4  26.0 - 34.0 pg Final  . MCHC 10/06/2020 33.5  30.0 - 36.0 g/dL Final  . RDW 10/06/2020 13.0  11.5 - 15.5 % Final  . Platelets 10/06/2020 247  150 - 400 K/uL Final  . nRBC 10/06/2020 0.0  0.0 - 0.2 % Final  . Neutrophils Relative % 10/06/2020 75  % Final  . Neutro Abs 10/06/2020 7.2  1.7 - 7.7 K/uL Final  . Lymphocytes Relative 10/06/2020 11  % Final  . Lymphs Abs 10/06/2020 1.0  0.7 - 4.0 K/uL Final  . Monocytes Relative 10/06/2020 10  % Final  . Monocytes Absolute 10/06/2020 1.0  0.1 - 1.0 K/uL Final  . Eosinophils Relative 10/06/2020 3  % Final  . Eosinophils Absolute 10/06/2020 0.3  0.0 - 0.5 K/uL Final  . Basophils Relative 10/06/2020 0  % Final  . Basophils Absolute 10/06/2020 0.0  0.0 - 0.1 K/uL Final  . Immature Granulocytes 10/06/2020 1  % Final  . Abs Immature Granulocytes 10/06/2020 0.07  0.00 - 0.07 K/uL Final   Performed at [facility], [place] [address], [place], [place] [address]  . Phosphorus 10/06/2020 3.2  2.5 - 4.6 mg/dL Final   Performed at [place] [address], [place], [place] [address]  . Magnesium 10/06/2020 2.1  1.7 - 2.4 mg/dL Final   Performed at [facility], [place] [address], [place], [place] [address]  . Total CK 10/06/2020 990* 49.0 - 397.0 U/L Final   Performed at [facility], [place] [address] [place], [place] [address]  . LDH 10/06/2020 198* 98 - 192 U/L Final   Performed at [facility], [place] [address] [place], [place] [address]  . Total Protein 10/06/2020 5.8* 6.5 - 8.1 g/dL Final  . Albumin 10/06/2020 2.5* 3.5 - 5.0 g/dL Final  . AST 10/06/2020 62* 15 - 41 U/L Final  . ALT 10/06/2020 43  0 - 44 U/L Final  . Alkaline Phosphatase 10/06/2020 60  38 - 126 U/L Final  . Total Bilirubin 10/06/2020  1.1  0.3 - 1.2 mg/dL Final  . Bilirubin, Direct 10/06/2020 0.3* 0.0 - 0.2 mg/dL Final  . Indirect Bilirubin 10/06/2020 0.8  0.3 - 0.9 mg/dL Final   Performed at [place] [address] [place], [place] [address]  . Total CK 10/07/2020 927* 49.0 - 397.0 U/L Final   Performed at [facility], [address] [place], [place] [address]  . LDH 10/07/2020 234* 98 - 192 U/L Final   Performed at [place] [address], [place], [place] [address]  . Total CK 10/08/2020 518* 49.0 - 397.0 U/L Final   Performed at [facility], [place] [address] [place], [place] [address]  . Sodium 10/08/2020 135  135 - 145 mmol/L Final  . Potassium 10/08/2020 3.8  3.5 - 5.1 mmol/L Final  . Chloride 10/08/2020 104  98 - 111 mmol/L Final  . CO2 10/08/2020 24  22 - 32 mmol/L Final  . Glucose, Bld 10/08/2020 115* 70 - 99 mg/dL Final   Glucose reference range applies only to samples taken after fasting for at least 8 hours.  . BUN 10/08/2020 17  8 - 23 mg/dL Final  . Creatinine, Ser 10/08/2020 0.70  0.61 - 1.24 mg/dL Final  . Calcium 10/08/2020 8.2* 8.9 - 10.3 mg/dL Final  . Total Protein 10/08/2020 5.2* 6.5 - 8.1 g/dL Final  . Albumin 10/08/2020 2.3* 3.5 - 5.0 g/dL Final  . AST 10/08/2020 100* 15 - 41 U/L Final  . ALT 10/08/2020 83* 0 - 44 U/L Final  . Alkaline Phosphatase 10/08/2020 74  38 - 126 U/L Final  . Total Bilirubin 10/08/2020 1.2  0.3 - 1.2 mg/dL Final  . GFR, Estimated 10/08/2020 >60  >60 mL/min Final   Comment: (NOTE) Calculated using the CKD-EPI Creatinine Equation (2021)   . Anion gap 10/08/2020 7  5 - 15 Final   Performed at [facility], [place] [address], [place], [place] [address]  . WBC 10/08/2020 9.1  4.0 - 10.5 K/uL Final  . RBC 10/08/2020 2.77* 4.22 - 5.81 MIL/uL Final  . Hemoglobin 10/08/2020 8.7* 13.0 - 17.0 g/dL Final  . HCT 10/08/2020 26.1* 39 - 52 % Final  . MCV 10/08/2020 94.2  80.0 - 100.0 fL Final  . MCH 10/08/2020 31.4  26.0 - 34.0 pg Final  .  MCHC 10/08/2020 33.3  30.0 - 36.0 g/dL Final  . RDW 10/08/2020 12.9  11.5 - 15.5 % Final  . Platelets 10/08/2020 307  150 - 400 K/uL Final  . nRBC 10/08/2020 0.0  0.0 - 0.2 % Final  . Neutrophils Relative % 10/08/2020 71  % Final  . Neutro Abs 10/08/2020 6.4  1.7 - 7.7 K/uL Final  . Lymphocytes Relative 10/08/2020 14  % Final  . Lymphs Abs 10/08/2020 1.3  0.7 - 4.0 K/uL Final  . Monocytes Relative 10/08/2020 9  % Final  . Monocytes Absolute 10/08/2020 0.8  0.1 - 1.0 K/uL Final  . Eosinophils Relative 10/08/2020 5  % Final  . Eosinophils Absolute 10/08/2020 0.4  0.0 - 0.5 K/uL Final  . Basophils Relative 10/08/2020 0  % Final  . Basophils Absolute 10/08/2020 0.0  0.0 - 0.1 K/uL Final  . Immature Granulocytes 10/08/2020 1  % Final  . Abs Immature Granulocytes 10/08/2020 0.12* 0.00 - 0.07 K/uL Final   Performed at [facility], [place] [address] [place], [place] [address]  . Total CK 10/09/2020 218  49.0 - 397.0 U/L Final   Performed at [facility], [place] [address] [place],  [address]  . Total Protein 10/09/2020 5.3* 6.5 - 8.1 g/dL Final  . Albumin 10/09/2020 2.4* 3.5 - 5.0 g/dL Final  . AST 10/09/2020 68* 15 - 41 U/L Final  . ALT 10/09/2020 77* 0 - 44 U/L Final  . Alkaline Phosphatase 10/09/2020 72  38 - 126 U/L Final  . Total Bilirubin 10/09/2020 1.2  0.3 - 1.2 mg/dL Final  . Bilirubin, Direct 10/09/2020 0.2  0.0 - 0.2 mg/dL Final  . Indirect Bilirubin 10/09/2020 1.0* 0.3 - 0.9 mg/dL Final   Performed at [facility] [address], [place], [place] [address]  Hospital Outpatient Visit on 09/26/2020  Component Date Value Ref Range Status  . SARS Coronavirus 2 09/26/2020 NEGATIVE  NEGATIVE Final   Comment: (NOTE) SARS-CoV-2 target nucleic acids are NOT DETECTED.  The SARS-CoV-2 RNA is generally detectable in upper and lower respiratory specimens during the acute phase of infection. Negative results do not preclude SARS-CoV-2 infection, do not rule  out co-infections with other pathogens, and should not be used as the sole basis for treatment or other patient management decisions. Negative results must be combined with clinical observations, patient history, and epidemiological information. The expected result is Negative.  Fact Sheet for Patients: SugarRoll.be  Fact Sheet for Healthcare Providers: https://www.woods-mathews.com/  This test is not yet approved or cleared by the [place] FDA and  has been authorized for detection and/or diagnosis of SARS-CoV-2 by FDA under an Emergency Use Authorization (EUA). This EUA will remain  in effect (meaning this test can be used) for the duration of the COVID-19 declaration under Se                          ction 564(b)(1) of the Act, 21 U.S.C. section 360bbb-3(b)(1), unless the authorization is terminated or revoked sooner.  Performed at [facility], [place] [address] [place], [place] [address]   Hospital Outpatient Visit on 09/20/2020  Component Date Value Ref Range Status  . MRSA, PCR 09/20/2020 NEGATIVE  NEGATIVE Final  . Staphylococcus aureus 09/20/2020 POSITIVE* NEGATIVE Final   Comment: (NOTE) The Xpert SA Assay (FDA approved for NASAL specimens in patients 2 years of age and older), is one component of a comprehensive surveillance program. It is not intended to diagnose infection nor to guide or monitor treatment. Performed at [facility], [place] [address], [place], [place] [address]   . aPTT 09/20/2020 38* 24 - 36 seconds Final   Comment:        IF BASELINE aPTT IS ELEVATED, SUGGEST PATIENT RISK ASSESSMENT BE USED TO DETERMINE APPROPRIATE ANTICOAGULANT THERAPY. Performed at [facility], [place] [address] [place], [place] [address]   . WBC 09/20/2020 5.7  4.0 - 10.5 K/uL Final  . RBC 09/20/2020 4.45  4.22 - 5.81 MIL/uL Final  . Hemoglobin 09/20/2020 13.9  13.0 - 17.0 g/dL Final   . HCT 09/20/2020 43.1  39 - 52 % Final  . MCV 09/20/2020 96.9  80.0 - 100.0 fL Final  . MCH 09/20/2020 31.2  26.0 - 34.0 pg Final  . MCHC 09/20/2020 32.3  30.0 - 36.0 g/dL Final  . RDW 09/20/2020 13.0  11.5 - 15.5 % Final  . Platelets 09/20/2020 171  150 - 400 K/uL Final  . nRBC 09/20/2020 0.0  0.0 - 0.2 % Final   Performed at [facility], [place] [address], [place], [place] [address]  . Sodium 09/20/2020 144  135 - 145 mmol/L Final  . Potassium 09/20/2020 4.8  3.5 - 5.1 mmol/L Final  . Chloride 09/20/2020 110  98 - 111 mmol/L Final  . CO2 09/20/2020 25  22 - 32 mmol/L Final  . Glucose, Bld 09/20/2020 90  70 - 99 mg/dL  Final   Glucose reference range applies only to samples taken after fasting for at least 8 hours.  . BUN 09/20/2020 29* 8 - 23 mg/dL Final  . Creatinine, Ser 09/20/2020 0.78  0.61 - 1.24 mg/dL Final  . Calcium 09/20/2020 9.3  8.9 - 10.3 mg/dL Final  . Total Protein 09/20/2020 6.7  6.5 - 8.1 g/dL Final  . Albumin 09/20/2020 4.0  3.5 - 5.0 g/dL Final  . AST 09/20/2020 18  15 - 41 U/L Final  . ALT 09/20/2020 18  0 - 44 U/L Final  . Alkaline Phosphatase 09/20/2020 72  38 - 126 U/L Final  . Total Bilirubin 09/20/2020 0.9  0.3 - 1.2 mg/dL Final  . GFR, Estimated 09/20/2020 >60  >60 mL/min Final   Comment: (NOTE) Calculated using the CKD-EPI Creatinine Equation (2021)   . Anion gap 09/20/2020 9  5 - 15 Final   Performed at [facility], [place] [address] [place], [place] [address]  . Prothrombin Time 09/20/2020 13.4  11.4 - 15.2 seconds Final  . INR 09/20/2020 1.1  0.8 - 1.2 Final   Comment: (NOTE) INR goal varies based on device and disease states. Performed at [facility], [place] [address] [place], [place] [address]   . ABO/RH(D) 09/20/2020 O POS   Final  . Antibody Screen 09/20/2020 NEG   Final  . Sample Expiration 09/20/2020    Final                   Value:09/23/2020,2359 Performed at [facility], [place] [address], [place], [place] [address]      X-Rays:CT ANGIO HEAD W OR WO CONTRAST  Result Date: 10/01/2020 CLINICAL DATA:  Acute neuro deficit.  Stroke. EXAM: CT ANGIOGRAPHY HEAD AND NECK TECHNIQUE: Multidetector CT imaging of the head and neck was performed using the standard protocol during bolus administration of intravenous contrast. Multiplanar CT image reconstructions and MIPs were obtained to evaluate the vascular anatomy. Carotid stenosis measurements (when applicable) are obtained utilizing NASCET criteria, using the distal internal carotid diameter as the denominator. CONTRAST:  182mL OMNIPAQUE IOHEXOL 350 MG/ML SOLN COMPARISON:  CT head and MRI head 10/01/2020 FINDINGS: CTA NECK FINDINGS Aortic arch: Standard branching. Imaged portion shows no evidence of aneurysm or dissection. No significant stenosis of the major arch vessel origins. Right carotid system: Atherosclerotic calcification right carotid bulb without significant stenosis. Left carotid system: Atherosclerotic calcification left carotid bulb without significant stenosis. Vertebral arteries: Both vertebral arteries patent to the basilar without stenosis. Skeleton: Cervical spondylosis.  No acute skeletal abnormality. Other neck: 10 mm left thyroid nodule. No further imaging necessary. No enlarged lymph nodes in the neck. Upper chest: Lung apices clear bilaterally. Review of the MIP images confirms the above findings CTA HEAD FINDINGS Anterior circulation: Mild atherosclerotic calcification in the cavernous carotid bilaterally without stenosis. Anterior and middle cerebral arteries patent bilaterally without significant stenosis or large vessel occlusion. Posterior circulation: Both vertebral arteries patent to the basilar. Left PICA patent. Right PICA not visualized. Basilar widely patent. AICA, superior cerebellar, and posterior cerebral arteries patent bilaterally without stenosis or large vessel occlusion. The Venous  sinuses: Normal venous enhancement Anatomic variants: None Review of the MIP images confirms the above findings IMPRESSION: 1. No significant carotid or vertebral artery stenosis in the neck 2. Negative for intracranial large vessel occlusion or flow limiting stenosis 3. Findings support cerebral emboli based on diffusion-weighted imaging. Electronically Signed   By: Franchot Gallo M.D.  On: 10/01/2020 11:02   CT HEAD WO CONTRAST  Result Date: 10/01/2020 CLINICAL DATA:  Headache and encephalopathy EXAM: CT HEAD WITHOUT CONTRAST TECHNIQUE: Contiguous axial images were obtained from the base of the skull through the vertex without intravenous contrast. COMPARISON:  None. FINDINGS: Brain: There is no mass, hemorrhage or extra-axial collection. There is generalized atrophy without lobar predilection. Hypodensity of the white matter is most commonly associated with chronic microvascular disease. Vascular: No abnormal hyperdensity of the major intracranial arteries or dural venous sinuses. No intracranial atherosclerosis. Skull: The visualized skull base, calvarium and extracranial soft tissues are normal. Sinuses/Orbits: No fluid levels or advanced mucosal thickening of the visualized paranasal sinuses. No mastoid or middle ear effusion. The orbits are normal. Examination mildly degraded by motion IMPRESSION: Generalized atrophy and chronic microvascular ischemia without acute intracranial abnormality. Electronically Signed   By: Ulyses Jarred M.D.   On: 10/01/2020 02:06   CT ANGIO NECK W OR WO CONTRAST  Result Date: 10/01/2020 CLINICAL DATA:  Acute neuro deficit.  Stroke. EXAM: CT ANGIOGRAPHY HEAD AND NECK TECHNIQUE: Multidetector CT imaging of the head and neck was performed using the standard protocol during bolus administration of intravenous contrast. Multiplanar CT image reconstructions and MIPs were obtained to evaluate the vascular anatomy. Carotid stenosis measurements (when applicable) are obtained  utilizing NASCET criteria, using the distal internal carotid diameter as the denominator. CONTRAST:  156mL OMNIPAQUE IOHEXOL 350 MG/ML SOLN COMPARISON:  CT head and MRI head 10/01/2020 FINDINGS: CTA NECK FINDINGS Aortic arch: Standard branching. Imaged portion shows no evidence of aneurysm or dissection. No significant stenosis of the major arch vessel origins. Right carotid system: Atherosclerotic calcification right carotid bulb without significant stenosis. Left carotid system: Atherosclerotic calcification left carotid bulb without significant stenosis. Vertebral arteries: Both vertebral arteries patent to the basilar without stenosis. Skeleton: Cervical spondylosis.  No acute skeletal abnormality. Other neck: 10 mm left thyroid nodule. No further imaging necessary. No enlarged lymph nodes in the neck. Upper chest: Lung apices clear bilaterally. Review of the MIP images confirms the above findings CTA HEAD FINDINGS Anterior circulation: Mild atherosclerotic calcification in the cavernous carotid bilaterally without stenosis. Anterior and middle cerebral arteries patent bilaterally without significant stenosis or large vessel occlusion. Posterior circulation: Both vertebral arteries patent to the basilar. Left PICA patent. Right PICA not visualized. Basilar widely patent. AICA, superior cerebellar, and posterior cerebral arteries patent bilaterally without stenosis or large vessel occlusion. The Venous sinuses: Normal venous enhancement Anatomic variants: None Review of the MIP images confirms the above findings IMPRESSION: 1. No significant carotid or vertebral artery stenosis in the neck 2. Negative for intracranial large vessel occlusion or flow limiting stenosis 3. Findings support cerebral emboli based on diffusion-weighted imaging. Electronically Signed   By: Franchot Gallo M.D.   On: 10/01/2020 11:02   MR BRAIN WO CONTRAST  Addendum Date: 10/01/2020   ADDENDUM REPORT: 10/01/2020 07:48 ADDENDUM: These  results will be called to the ordering clinician or representative by the Radiologist Assistant, and communication documented in the PACS or [employer]. Electronically Signed   By: Kellie Simmering DO   On: 10/01/2020 07:48   Result Date: 10/01/2020 CLINICAL DATA:  Neuro deficit, acute, stroke suspected. Additional history provided: Status post total knee replacement with sudden onset of intermittent confusion, headaches, vision changes, evaluate for possible CVA versus bleed. EXAM: MRI HEAD WITHOUT CONTRAST TECHNIQUE: Multiplanar, multiecho pulse sequences of the brain and surrounding structures were obtained without intravenous contrast. COMPARISON:  Noncontrast head CT performed earlier  the same day 10/01/2020. FINDINGS: Brain: Mild generalized cerebral atrophy. There are numerous small scattered acute cortical and subcortical infarcts within the bilateral cerebral hemispheres, affecting the frontal, parietal and occipital lobes. Moderate multifocal T2/FLAIR hyperintensity within the cerebral white matter is nonspecific, but compatible with chronic small vessel ischemic disease. No evidence of intracranial mass. No chronic intracranial blood products. No extra-axial fluid collection. No midline shift. Vascular: Expected proximal arterial flow voids. Skull and upper cervical spine: No focal marrow lesion. Sinuses/Orbits: Visualized orbits show no acute finding. Mild ethmoid sinus mucosal thickening. IMPRESSION: Numerous small scattered acute cortical and subcortical infarcts within the bilateral frontal, parietal and occipital lobes. Findings are likely secondary to an embolic process. Mild cerebral atrophy and moderate chronic small vessel ischemic disease. Mild ethmoid sinus mucosal thickening. Electronically Signed: By: Kellie Simmering DO On: 10/01/2020 07:40   US RENAL  Result Date: 10/05/2020 CLINICAL DATA:  86 year old male with urinary retention. EXAM: RENAL / URINARY TRACT ULTRASOUND COMPLETE  COMPARISON:  None. FINDINGS: Right Kidney: Renal measurements: 11.5 x 4.9 x 4.5 cm = volume: 130 mL. Cortical thinning is present. Echogenicity within normal limits. No mass or hydronephrosis visualized. Left Kidney: Renal measurements: 11.6 x 5.5 x 4.8 cm = volume: 161 mL. Cortical thinning is noted. Echogenicity within normal limits. No mass or hydronephrosis visualized. Bladder: A small amount of debris is noted within the bladder. No other bladder abnormalities are noted. Other: None. IMPRESSION: 1. Small amount of debris within the bladder. Correlate with possible infection. 2. Bilateral renal cortical thinning. 3. No evidence of hydronephrosis. Electronically Signed   By: Margarette Canada M.D.   On: 10/05/2020 08:56   ECHOCARDIOGRAM COMPLETE  Result Date: 10/01/2020    ECHOCARDIOGRAM REPORT   Patient Name:   Justin Rose  Date of Exam: 10/01/2020 Medical Rec #:  [id number]  Height:       71.0 in Accession #:    [id number] Weight:       188.6 lb Date of Birth:  July 23, 1934  BSA:          2.057 m Patient Age:    70 years   BP:           126/83 mmHg Patient Gender: M          HR:           70 bpm. Exam Location:  Inpatient Procedure: 2D Echo, Color Doppler and Cardiac Doppler Indications:    Stroke i163.9  History:        Patient has no prior history of Echocardiogram examinations.  Sonographer:    Raquel Sarna Senior RDCS Referring Phys: [id number] [facility]  Sonographer Comments: Technically difficult study due to poor echo windows. Scanned supine; 1 day post TKA IMPRESSIONS  1. Left ventricular ejection fraction, by estimation, is 60 to 65%. The left ventricle has normal function. Left ventricular endocardial border not optimally defined to evaluate regional wall motion. Left ventricular diastolic parameters are consistent with Grade I diastolic dysfunction (impaired relaxation).  2. Right ventricular systolic function is mildly reduced. The right ventricular size is mildly enlarged. There is normal pulmonary artery  systolic pressure. The estimated right ventricular systolic pressure is 38.8 mmHg.  3. The mitral valve is grossly normal. No evidence of mitral valve regurgitation. No evidence of mitral stenosis.  4. The aortic valve is abnormal. There is mild calcification of the aortic valve. Aortic valve regurgitation is not visualized. No aortic valve stenosis noted on this exam, however gradient is likely underestimated due  to suboptimal Doppler alignment.  5. The inferior vena cava is dilated in size with >50% respiratory variability, suggesting right atrial pressure of 8 mmHg. Conclusion(s)/Recommendation(s): No intracardiac source of embolism detected on this transthoracic study. Image quality limits sensitivity to detect small lesions. FINDINGS  Left Ventricle: Left ventricular ejection fraction, by estimation, is 60 to 65%. The left ventricle has normal function. Left ventricular endocardial border not optimally defined to evaluate regional wall motion. The left ventricular internal cavity size was normal in size. There is no left ventricular hypertrophy. Left ventricular diastolic parameters are consistent with Grade I diastolic dysfunction (impaired relaxation). Right Ventricle: The right ventricular size is mildly enlarged. No increase in right ventricular wall thickness. Right ventricular systolic function is mildly reduced. There is normal pulmonary artery systolic pressure. The tricuspid regurgitant velocity  is 2.31 m/s, and with an assumed right atrial pressure of 8 mmHg, the estimated right ventricular systolic pressure is 08.6 mmHg. Left Atrium: Left atrial size was normal in size. Right Atrium: Right atrial size was normal in size. Pericardium: There is no evidence of pericardial effusion. Mitral Valve: The mitral valve is grossly normal. No evidence of mitral valve regurgitation. No evidence of mitral valve stenosis. Tricuspid Valve: The tricuspid valve is normal in structure. Tricuspid valve regurgitation is  not demonstrated. No evidence of tricuspid stenosis. Aortic Valve: The aortic valve is abnormal. There is moderate calcification of the aortic valve. Aortic valve regurgitation is not visualized. Pulmonic Valve: The pulmonic valve was not well visualized. Pulmonic valve regurgitation is not visualized. No evidence of pulmonic stenosis. Aorta: The aortic root is normal in size and structure. Venous: The inferior vena cava is dilated in size with greater than 50% respiratory variability, suggesting right atrial pressure of 8 mmHg. IAS/Shunts: No atrial level shunt detected by color flow Doppler.  LEFT VENTRICLE PLAX 2D LVIDd:         4.30 cm  Diastology LVIDs:         2.86 cm  LV e' medial:    8.16 cm/s LV PW:         0.85 cm  LV E/e' medial:  7.9 LV IVS:        0.92 cm  LV e' lateral:   10.80 cm/s LVOT diam:     2.20 cm  LV E/e' lateral: 6.0 LV SV:         73 LV SV Index:   35 LVOT Area:     3.80 cm  RIGHT VENTRICLE RV S prime:     8.92 cm/s TAPSE (M-mode): 1.9 cm LEFT ATRIUM           Index       RIGHT ATRIUM           Index LA diam:      3.10 cm 1.51 cm/m  RA Area:     15.00 cm LA Vol (A4C): 49.2 ml 23.92 ml/m RA Volume:   39.60 ml  19.25 ml/m  AORTIC VALVE LVOT Vmax:   90.90 cm/s LVOT Vmean:  73.900 cm/s LVOT VTI:    0.192 m  AORTA Ao Root diam: 3.60 cm MITRAL VALVE               TRICUSPID VALVE MV Area (PHT): 2.73 cm    TR Peak grad:   21.3 mmHg MV Decel Time: 278 msec    TR Vmax:        231.00 cm/s MV E velocity: 64.50 cm/s MV A velocity: 76.20 cm/s  SHUNTS MV E/A ratio:  0.85        Systemic VTI:  0.19 m                            Systemic Diam: 2.20 cm Cherlynn Kaiser MD Electronically signed by Cherlynn Kaiser MD Signature Date/Time: 10/01/2020/3:34:11 PM    Final    VAS [place] LOWER EXTREMITY VENOUS (DVT)  Result Date: 10/02/2020  Lower Venous DVT Study Indications: Embolic stroke s/p right knee surgery.  Limitations: Orthopaedic appliance. Comparison Study: no prior Performing Technologist: Abram Sander RVS  Examination Guidelines: A complete evaluation includes B-mode imaging, spectral Doppler, color Doppler, and power Doppler as needed of all accessible portions of each vessel. Bilateral testing is considered an integral part of a complete examination. Limited examinations for reoccurring indications may be performed as noted. The reflux portion of the exam is performed with the patient in reverse Trendelenburg.  +---------+---------------+---------+-----------+----------+-------------------+ RIGHT    CompressibilityPhasicitySpontaneityPropertiesThrombus Aging      +---------+---------------+---------+-----------+----------+-------------------+ CFV      Full           Yes      Yes                                      +---------+---------------+---------+-----------+----------+-------------------+ SFJ      Full                                                             +---------+---------------+---------+-----------+----------+-------------------+ FV Prox  Full                                                             +---------+---------------+---------+-----------+----------+-------------------+ FV Mid   Full                                                             +---------+---------------+---------+-----------+----------+-------------------+ FV DistalFull                                                             +---------+---------------+---------+-----------+----------+-------------------+ PFV      Full                                                             +---------+---------------+---------+-----------+----------+-------------------+ POP  Not well visualized +---------+---------------+---------+-----------+----------+-------------------+ PTV      Full                                                              +---------+---------------+---------+-----------+----------+-------------------+ PERO     Full                                                             +---------+---------------+---------+-----------+----------+-------------------+   +---------+---------------+---------+-----------+----------+--------------+ LEFT     CompressibilityPhasicitySpontaneityPropertiesThrombus Aging +---------+---------------+---------+-----------+----------+--------------+ CFV      Full           Yes      Yes                                 +---------+---------------+---------+-----------+----------+--------------+ SFJ      Full                                                        +---------+---------------+---------+-----------+----------+--------------+ FV Prox  Full                                                        +---------+---------------+---------+-----------+----------+--------------+ FV Mid   Full                                                        +---------+---------------+---------+-----------+----------+--------------+ FV DistalFull                                                        +---------+---------------+---------+-----------+----------+--------------+ PFV      Full                                                        +---------+---------------+---------+-----------+----------+--------------+ POP      Full           Yes      Yes                                 +---------+---------------+---------+-----------+----------+--------------+ PTV      Full                                                        +---------+---------------+---------+-----------+----------+--------------+  PERO     Full                                                        +---------+---------------+---------+-----------+----------+--------------+     Summary: BILATERAL: - No evidence of deep vein thrombosis seen in the lower extremities, bilaterally. -  No evidence of superficial venous thrombosis in the lower extremities, bilaterally. -   *See table(s) above for measurements and observations. Electronically signed by Harold Barban MD on 10/02/2020 at 8:19:36 PM.    Final     EKG: Orders placed or performed during the hospital encounter of 09/30/20  . EKG 12-Lead  . EKG 12-Lead     Hospital Course: Justin Rose is a 86 y.o. who was admitted to [facility]. They were brought to the operating room on 09/30/2020 and underwent Procedure(s): TOTAL KNEE ARTHROPLASTY.  Patient tolerated the procedure well and was later transferred to the recovery room and then to the orthopaedic floor for postoperative care. They were given PO and IV analgesics for pain control following their surgery. They were given 24 hours of postoperative antibiotics of  Anti-infectives (From admission, onward)   Start     Dose/Rate Route Frequency Ordered Stop   10/09/20 0000  cephALEXin (KEFLEX) 500 MG capsule        500 mg Oral 3 times daily 10/09/20 1424 10/12/20 2359   10/08/20 1400  cephALEXin (KEFLEX) capsule 500 mg        500 mg Oral Every 8 hours 10/08/20 1203 10/09/20 0641   10/05/20 0800  cefTRIAXone (ROCEPHIN) 1 g in sodium chloride 0.9 % 100 mL IVPB  Status:  Discontinued        1 g 200 mL/hr over 30 Minutes Intravenous Every 24 hours 10/05/20 0727 10/08/20 1203   09/30/20 1400  ceFAZolin (ANCEF) IVPB 2g/100 mL premix        2 g 200 mL/hr over 30 Minutes Intravenous Every 6 hours 09/30/20 1026 09/30/20 2232   09/30/20 0600  ceFAZolin (ANCEF) IVPB 2g/100 mL premix        2 g 200 mL/hr over 30 Minutes Intravenous On call to O.R. 09/30/20 0530 09/30/20 0714     and started on DVT prophylaxis in the form of Aspirin.   PT and OT were ordered for total joint protocol. Discharge planning consulted to help with postop disposition and equipment needs. By evening he was feeling dizzy when sitting up and later night he had frontal headache, some visual disturbances  like he was seeing squiggles but no visual field loss.  Also developed incoordination of hand movements as he was not able to dial his cell phone or answer it.  He felt tremulous. Rapid response called at night, stat CT scan head was normal.  Neurology was consulted.  With no significant neurological deficit, was not intervention candidate.  Subsequent MRI showed multifocal embolic stroke. He was transported to [facility] neurology unit for closer monitoring. He continued to work with physical therapy throughout his hospital stay, had slower progression due to his postoperative complications. He developed a UTI and was placed on IV rocephin. DVT prophylaxis was switched to Plavix and Aspirin per neurology for stroke protocol. It was determined that patient would require discharge to CIR for rehabilitation. Approval for this took several days. On POD #9, patient was  approved for CIR and ready for discharge. Orthopedics and hospitalist service both signed off and he was transferred to CIR in stable condition.  Per Hospitalist Service: 3 days KEFLEX 500 mg TID (to be completed on 11/27) Plavix 75 mg QD & 81 mg ASA QD x 3 weeks. Then 81 mg ASA QD. Will follow-up with neurology outpatient in 4 weeks.  Continue flomax (started inpatient).   Diet: Cardiac diet Activity: WBAT Follow-up: in 2 weeks Disposition: CIR Discharged Condition: stable   Discharge Instructions    Ambulatory referral to Neurology   Complete by: As directed    Follow up with stroke clinic NP (Jessica Vanschaick or Cecille Rubin, if both not available, consider Zachery Dauer, or Ahern) at [facility] in about 4 weeks. Thanks.   Call MD / Call 911   Complete by: As directed    If you experience chest pain or shortness of breath, CALL 911 and be transported to the hospital emergency room.  If you develope a fever above 101 F, pus (white drainage) or increased drainage or redness at the wound, or calf pain, call your surgeon's office.     Change dressing   Complete by: As directed    You may remove the bulky bandage (ACE wrap and gauze) two days after surgery. You will have an adhesive waterproof bandage underneath. Leave this in place until your first follow-up appointment.   Constipation Prevention   Complete by: As directed    Drink plenty of fluids.  Prune juice may be helpful.  You may use a stool softener, such as Colace (over the counter) 100 mg twice a day.  Use MiraLax (over the counter) for constipation as needed.   Diet - low sodium heart healthy   Complete by: As directed    Do not put a pillow under the knee. Place it under the heel.   Complete by: As directed    Driving restrictions   Complete by: As directed    No driving for two weeks   TED hose   Complete by: As directed    Use stockings (TED hose) for three weeks on both leg(s).  You may remove them at night for sleeping.   Weight bearing as tolerated   Complete by: As directed      Allergies as of 10/09/2020      Reactions   Lipitor [atorvastatin]    Myositis      Medication List    STOP taking these medications   meloxicam 15 MG tablet Commonly known as: MOBIC     TAKE these medications   aspirin 81 MG chewable tablet Chew 1 tablet (81 mg total) by mouth daily. Start taking on: October 10, 2020   cephALEXin 500 MG capsule Commonly known as: KEFLEX Take 1 capsule (500 mg total) by mouth 3 (three) times daily for 3 days.   cetirizine 10 MG tablet Commonly known as: ZYRTEC Take 10 mg by mouth daily.   cholecalciferol 25 MCG (1000 UNIT) tablet Commonly known as: VITAMIN D3 Take 1,000 Units by mouth daily.   clopidogrel 75 MG tablet Commonly known as: PLAVIX Take 1 tablet (75 mg total) by mouth daily for 21 days. Start taking on: October 10, 2020   Combigan 0.2-0.5 % ophthalmic solution Generic drug: brimonidine-timolol Place 1 drop into the left eye daily.   GLUCOSAMINE-CHONDROITIN PO Take 1 tablet by mouth daily.    ipratropium 0.06 % nasal spray Commonly known as: ATROVENT Place 2 sprays into both nostrils daily.  MAGNESIUM PO Take 1 tablet by mouth daily.   methocarbamol 500 MG tablet Commonly known as: ROBAXIN Take 1 tablet (500 mg total) by mouth every 6 (six) hours as needed for muscle spasms.   ondansetron 4 MG tablet Commonly known as: ZOFRAN Take 1 tablet (4 mg total) by mouth every 6 (six) hours as needed for nausea.   oxyCODONE 5 MG immediate release tablet Commonly known as: Oxy IR/ROXICODONE Take 1-2 tablets (5-10 mg total) by mouth every 6 (six) hours as needed for severe pain.   polyethylene glycol 17 g packet Commonly known as: MIRALAX / GLYCOLAX Take 17 g by mouth 2 (two) times daily.   tamsulosin 0.4 MG Caps capsule Commonly known as: FLOMAX Take 1 capsule (0.4 mg total) by mouth daily. Start taking on: October 10, 2020   traMADol 50 MG tablet Commonly known as: ULTRAM Take 1-2 tablets (50-100 mg total) by mouth every 6 (six) hours as needed for moderate pain.   vitamin B-12 1000 MCG tablet Commonly known as: CYANOCOBALAMIN Take 1,000 mcg by mouth daily.            Discharge Care Instructions  (From admission, onward)         Start     Ordered   10/09/20 0000  Weight bearing as tolerated        10/09/20 1201   10/09/20 0000  Change dressing       Comments: You may remove the bulky bandage (ACE wrap and gauze) two days after surgery. You will have an adhesive waterproof bandage underneath. Leave this in place until your first follow-up appointment.   10/09/20 1201          Follow-up Information    Gaynelle Arabian, MD. Go on 10/15/2020.   Specialty: Orthopedic Surgery Why: You are scheduled for a post-operative appointment on 10-15-20 at 1:00 pm.  Contact information: [address] [place] [address] [phone]        [facility]. Schedule an appointment as soon as possible for a visit in 4 week(s).    Specialty: Neurology Contact information: [place] [address] [phone]              Signed: Theresa Duty, PA-C Orthopedic Surgery 10/09/2020, 2:25 PM

## 2020-10-09 NOTE — H&P (Signed)
Physical Medicine and Rehabilitation Admission H&P    CC: Functional deficits.   HPI: Justin Rose is a 86 y.o. male with history of glaucoma, BCC, OA bilateral Knees who was admitted on 09/30/20 for R-TKR by Dr. Wynelle Link. History taken from chart review and patient. Posts op he reported dizziness with left elbow numbness as well as visual deficits with floaters as well as transient speech difficutly.  CT head unremarkable for acute intracranial process. 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  CTA head/neck was negative for LVO or significant stenosis. BLE dopplers negative for DVT. Echocardiogram with EF of 60-65%. Dr. 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  Hospital course further complicated by urinary retention and foley placed. Urine culture positive for proteus mirabilis.  He was started on lipitor but developed myalgias with elevated CK and abormal LFTs felt to be due to statin induced myositis treated with IVF with improvement in muscle pain. Therapy evaluations revealed mild cognitive impairment as well as LLE limitation due to recent TKR. CIR recommended due to functional deficits. Please see preadmission assessment from earlier today as well.    Review of Systems  Constitutional: Positive for malaise/fatigue.  HENT: Positive for hearing loss. Negative for tinnitus.   Respiratory: Negative for cough and shortness of breath.   Cardiovascular: Negative for chest pain, claudication and leg swelling.  Gastrointestinal: Positive for diarrhea.  Musculoskeletal: Positive for joint pain and myalgias.  Skin: Negative for rash.  Neurological: Positive for dizziness, focal weakness and weakness. Negative for speech change.  All  other systems reviewed and are negative.   Past Medical History:  Diagnosis Date  . Arthritis   . Basal cell carcinoma (BCC) of head   . Basal cell carcinoma, ear    Left ear  . Glaucoma    Left eye    Past Surgical History:  Procedure Laterality Date  . BACK SURGERY     2021  . BASAL CELL CARCINOMA EXCISION    . EYE SURGERY     08/2020 left eye  . HAND SURGERY    . PARATHYROIDECTOMY    . TONSILLECTOMY    . TOTAL KNEE ARTHROPLASTY Right 09/30/2020   Procedure: TOTAL KNEE ARTHROPLASTY;  Surgeon: Gaynelle Arabian, MD;  Location: [facility];  Service: Orthopedics;  Laterality: Right;    No pertinent family history of premature cva. Both parents had cancer.    Social History:  Married. 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 He reports that he has quit smoking. He has a 4.00 pack-year smoking history. He has never used smokeless tobacco. He reports that he does not drink alcohol and does not use drugs.    Allergies: No Known Allergies    Medications Prior to Admission  Medication Sig Dispense Refill  . brimonidine-timolol (COMBIGAN) 0.2-0.5 % ophthalmic solution Place 1 drop into the left eye daily.    . cetirizine (ZYRTEC) 10 MG tablet Take 10 mg by mouth daily.    . cholecalciferol (VITAMIN D3) 25 MCG (1000 UNIT) tablet Take 1,000 Units by mouth daily.    Marland Kitchen GLUCOSAMINE-CHONDROITIN PO Take 1 tablet by mouth daily.    Marland Kitchen ipratropium (ATROVENT) 0.06 % nasal spray Place 2 sprays into both nostrils daily.    Marland Kitchen MAGNESIUM PO Take 1  tablet by mouth daily.    . meloxicam (MOBIC) 15 MG tablet Take 15 mg by mouth daily.    . vitamin B-12 (CYANOCOBALAMIN) 1000 MCG tablet Take 1,000 mcg by mouth daily.      Drug Regimen Review  Drug regimen was reviewed and remains appropriate with no significant issues identified  Home: Home Living Family/patient expects to be discharged to:: Private residence Living Arrangements: Spouse/significant other Available Help at Discharge:  Family Type of Home: House Home Access: Stairs to enter Technical brewer of Steps: 3 Entrance Stairs-Rails: Left Home Layout: One level Bathroom Shower/Tub: Multimedia programmer: Handicapped height Bathroom Accessibility: Yes Home Equipment: Environmental consultant - 2 wheels, Crutches, Volin - single point, Wheelchair - manual Additional Comments: wife named Environmental education officer   Functional History: Prior Function Level of Independence: Independent Comments: reports enjoys playing golf and does all the yard work  Functional Status:  Mobility: Bed Mobility Overal bed mobility: Needs Assistance Bed Mobility: Sit to Supine Supine to sit: Min assist Sit to supine: Mod assist General bed mobility comments: mod A for RLE and positioning in CPM Transfers Overall transfer level: Needs assistance Equipment used: Rolling walker (2 wheeled) Transfers: Sit to/from Stand Sit to Stand: Mod assist Stand pivot transfers: Mod assist General transfer comment: needed mod A for power up and fwd wt shift from recliner. Had difficulty stepping feet to bed with use of RW.  Ambulation/Gait Ambulation/Gait assistance: Min assist, Mod assist Gait Distance (Feet): 6 Feet Assistive device: Rolling walker (2 wheeled) Gait Pattern/deviations: Step-to pattern, Decreased weight shift to right, Trunk flexed General Gait Details: pt became light headed at 87' and needed to return to sitting, vc's for posture Gait velocity: decreased Gait velocity interpretation: <1.31 ft/sec, indicative of household ambulator    ADL: ADL Overall ADL's : Needs assistance/impaired Eating/Feeding: Set up, Sitting Eating/Feeding Details (indicate cue type and reason): pt insistign wife place egg salad sandwich on window for later even with education that we can get him a fresh one for dinner. pt with poor intake of lunch Grooming: Oral care, Standing, Moderate assistance (eva walker) Grooming Details (indicate cue type and reason): OT  helping hand pt all needed items and one step commands to complete oral task. OT holding basin for spitting Lower Body Dressing: Total assistance Toilet Transfer: Moderate assistance Toilet Transfer Details (indicate cue type and reason): simulated from chair Functional mobility during ADLs: Moderate assistance (eva walker) General ADL Comments: pt progressed with mod cues with eva walker to sink surface and back.   Cognition: Cognition Overall Cognitive Status: Impaired/Different from baseline Arousal/Alertness: Awake/alert Orientation Level: Oriented X4 Attention: Selective Selective Attention: Impaired Selective Attention Impairment: Verbal basic Memory: Impaired Memory Impairment: Storage deficit Problem Solving: Impaired Problem Solving Impairment: Verbal complex Cognition Arousal/Alertness: Awake/alert Behavior During Therapy: WFL for tasks assessed/performed Overall Cognitive Status: Impaired/Different from baseline Area of Impairment: Safety/judgement, Awareness, Memory Memory: Decreased short-term memory Following Commands: Follows one step commands with increased time Safety/Judgement: Decreased awareness of safety, Decreased awareness of deficits Awareness: Emergent Problem Solving: Slow processing General Comments: pt having difficulty recalling sequence of day. When asked if he is ready to get out of chair, he states, "you tell me if it's time". Encouraged to advocate for himself when asked and when he feels uncomfortable. RN had this comversation with him this morning as well in regard to his PRN meds  Physical Exam: Blood pressure 106/68, pulse 96, temperature 98.4 F (36.9 C), temperature source Oral, resp. rate 18, height 5\' 11"  (1.803  m), weight 85.5 kg, SpO2 97 %. Physical Exam Vitals reviewed.  Constitutional:      General: He is not in acute distress.    Appearance: He is not ill-appearing.  HENT:     Head: Normocephalic and atraumatic.     Right Ear:  External ear normal.     Left Ear: External ear normal.     Nose: Nose normal.  Eyes:     General:        Right eye: No discharge.        Left eye: No discharge.     Extraocular Movements: Extraocular movements intact.  Cardiovascular:     Rate and Rhythm: Normal rate and regular rhythm.  Pulmonary:     Effort: Pulmonary effort is normal.     Breath sounds: Normal breath sounds.  Abdominal:     General: Abdomen is flat. Bowel sounds are normal. There is no distension.  Musculoskeletal:     Cervical back: Normal range of motion and neck supple.     Comments: Right knee with edema and tenderness Intrinsic muscle atrophy  Skin:    General: Skin is warm and dry.     Comments: Right knee with dressing CDI  Neurological:     Mental Status: He is alert and oriented to person, place, and time.     Comments: Alert and oriented Motor: B/l UE: 5/5 proximal to distal RLE: HF 3-/5, KE limited, ADF 4/5 (pain inhibition) LLE: HF, KE 4-/5, ADF 4/5  Psychiatric:        Mood and Affect: Mood normal.        Behavior: Behavior normal.     Results for orders placed or performed during the hospital encounter of 09/30/20 (from the past 48 hour(s))  CK     Status: Abnormal   Collection Time: 10/08/20  3:10 AM  Result Value Ref Range   Total CK 518 (H) 49.0 - 397.0 U/L    Comment: Performed at [facility], [place] [address] [place], [place] [address]  Comprehensive metabolic panel     Status: Abnormal   Collection Time: 10/08/20  3:10 AM  Result Value Ref Range   Sodium 135 135 - 145 mmol/L   Potassium 3.8 3.5 - 5.1 mmol/L   Chloride 104 98 - 111 mmol/L   CO2 24 22 - 32 mmol/L   Glucose, Bld 115 (H) 70 - 99 mg/dL    Comment: Glucose reference range applies only to samples taken after fasting for at least 8 hours.   BUN 17 8 - 23 mg/dL   Creatinine, Ser 0.70 0.61 - 1.24 mg/dL   Calcium 8.2 (L) 8.9 - 10.3 mg/dL   Total Protein 5.2 (L) 6.5 - 8.1 g/dL   Albumin 2.3 (L) 3.5 - 5.0 g/dL    AST 100 (H) 15 - 41 U/L   ALT 83 (H) 0 - 44 U/L   Alkaline Phosphatase 74 38 - 126 U/L   Total Bilirubin 1.2 0.3 - 1.2 mg/dL   GFR, Estimated >60 >60 mL/min    Comment: (NOTE) Calculated using the CKD-EPI Creatinine Equation (2021)    Anion gap 7 5 - 15    Comment: Performed at [facility], [place] [address], [place], [place] [address]  CBC with Differential/Platelet     Status: Abnormal   Collection Time: 10/08/20  3:10 AM  Result Value Ref Range   WBC 9.1 4.0 - 10.5 K/uL   RBC 2.77 (L) 4.22 - 5.81  MIL/uL   Hemoglobin 8.7 (L) 13.0 - 17.0 g/dL   HCT 26.1 (L) 39 - 52 %   MCV 94.2 80.0 - 100.0 fL   MCH 31.4 26.0 - 34.0 pg   MCHC 33.3 30.0 - 36.0 g/dL   RDW 12.9 11.5 - 15.5 %   Platelets 307 150 - 400 K/uL   nRBC 0.0 0.0 - 0.2 %   Neutrophils Relative % 71 %   Neutro Abs 6.4 1.7 - 7.7 K/uL   Lymphocytes Relative 14 %   Lymphs Abs 1.3 0.7 - 4.0 K/uL   Monocytes Relative 9 %   Monocytes Absolute 0.8 0.1 - 1.0 K/uL   Eosinophils Relative 5 %   Eosinophils Absolute 0.4 0.0 - 0.5 K/uL   Basophils Relative 0 %   Basophils Absolute 0.0 0.0 - 0.1 K/uL   Immature Granulocytes 1 %   Abs Immature Granulocytes 0.12 (H) 0.00 - 0.07 K/uL    Comment: Performed at [facility] [address] [place], [place] [address]  CK     Status: None   Collection Time: 10/09/20  3:37 AM  Result Value Ref Range   Total CK 218 49.0 - 397.0 U/L    Comment: Performed at [facility], [place] [address] [place], [place] [address]  Hepatic function panel     Status: Abnormal   Collection Time: 10/09/20  3:37 AM  Result Value Ref Range   Total Protein 5.3 (L) 6.5 - 8.1 g/dL   Albumin 2.4 (L) 3.5 - 5.0 g/dL   AST 68 (H) 15 - 41 U/L   ALT 77 (H) 0 - 44 U/L   Alkaline Phosphatase 72 38 - 126 U/L   Total Bilirubin 1.2 0.3 - 1.2 mg/dL   Bilirubin, Direct 0.2 0.0 - 0.2 mg/dL   Indirect Bilirubin 1.0 (H) 0.3 - 0.9 mg/dL    Comment: Performed at [facility] [address],  [place], [place] [address]   No results found.     Medical Problem List and Plan: 1.  Limitation with mobility, transfers, self-care secondary to right TKA with complicated by multifocal infarcts.  -patient shower  -ELOS/Goals: 13-16 days/Supervision/Mod I  Admit to CIR 2.  Antithrombotics: -DVT/anticoagulation:  Mechanical: Sequential compression devices, below knee Bilateral lower extremities. Will check dopplers in am due to poor mobility.   -antiplatelet therapy: ASA/Plavix 3. Pain Management: Oxycodone, tramadol and/or robaxin prn.  4. Mood: LCSW to follow for evaluation and support.   -antipsychotic agents: N/A 5. Neuropsych: This patient is capable of making decisions on his own behalf. 6. Skin/Wound Care: Monitor incision for healing. Add protein supplement to help promote wound healing.  7. Fluids/Electrolytes/Nutrition: Monitor I/O.   CMP ordered for tomorrow AM 8. Dizziness: Monitor orthostatic vitals. Encourage fluid intake. 9. ABLA: H/H with steady downward trend. 13.9-->8.7.   Stool guaiacs ordered  CBC ordered for tomorrow AM  Iron supplement added 10. Statin induced myositis: Abnormal LFTs being monitored.    CMP ordered for tomorrow AM 11. Drug induced constipation: Resolved--and now with diarrhea likely multifactorial.  Will hold bowel meds 12. Proteus UTI:  IV Rocephin X 3 days-->Keflex thorough 11/27 per recs.  13. Urinary retention: Continue Flomax.  Plan to d/c foley tomorrow with voiding trial  Bary Leriche, PA-C 10/09/2020   I have personally performed a face to face diagnostic evaluation, including, but not limited to relevant history and physical exam findings, of this patient and developed relevant assessment and plan.  Additionally, I have reviewed and concur with the physician assistant's documentation above.  Delice Lesch, MD, ABPMR  The patient's status has not changed. Any changes from the pre-admission screening or documentation from the acute chart  are noted above.   Delice Lesch, MD, ABPMR

## 2020-10-09 NOTE — Progress Notes (Signed)
Patient arrived on unit, oriented to unit. Reviewed medications, therapy schedule, rehab routine and plan of care. States an understanding of information reviewed. No complications noted at this time. Justin Rose  

## 2020-10-09 NOTE — Progress Notes (Signed)
Inpatient Rehabilitation-Admissions Coordinator   Expedited appeal overturned the denial. Received insurance authorization for admit to CIR today. Notified the patient of his approval and bed offer and he has accepted. Received medical clearance from attending service for admit to CIR today. Reviewed insurance benefit letter and consent forms with the patient. All questions answered.   RN and [facility] team notified of plan for admit today.   Raechel Ache, OTR/L  Rehab Admissions Coordinator  [phone] 10/09/2020 12:19 PM

## 2020-10-10 DIAGNOSIS — I6340 Cerebral infarction due to embolism of unspecified cerebral artery: Secondary | ICD-10-CM | POA: Diagnosis not present

## 2020-10-10 LAB — URINALYSIS, ROUTINE W REFLEX MICROSCOPIC
Bilirubin Urine: NEGATIVE
Glucose, UA: NEGATIVE mg/dL
Hgb urine dipstick: NEGATIVE
Ketones, ur: NEGATIVE mg/dL
Leukocytes,Ua: NEGATIVE
Nitrite: NEGATIVE
Protein, ur: NEGATIVE mg/dL
Specific Gravity, Urine: 1.006 (ref 1.005–1.030)
pH: 7 (ref 5.0–8.0)

## 2020-10-10 LAB — COMPREHENSIVE METABOLIC PANEL
ALT: 92 U/L — ABNORMAL HIGH (ref 0–44)
AST: 80 U/L — ABNORMAL HIGH (ref 15–41)
Albumin: 2.7 g/dL — ABNORMAL LOW (ref 3.5–5.0)
Alkaline Phosphatase: 90 U/L (ref 38–126)
Anion gap: 11 (ref 5–15)
BUN: 13 mg/dL (ref 8–23)
CO2: 23 mmol/L (ref 22–32)
Calcium: 8.7 mg/dL — ABNORMAL LOW (ref 8.9–10.3)
Chloride: 100 mmol/L (ref 98–111)
Creatinine, Ser: 0.74 mg/dL (ref 0.61–1.24)
GFR, Estimated: >60 mL/min (ref >60)
Glucose, Bld: 137 mg/dL — ABNORMAL HIGH (ref 70–99)
Potassium: 3.5 mmol/L (ref 3.5–5.1)
Sodium: 134 mmol/L — ABNORMAL LOW (ref 135–145)
Total Bilirubin: 1.2 mg/dL (ref 0.3–1.2)
Total Protein: 5.8 g/dL — ABNORMAL LOW (ref 6.5–8.1)

## 2020-10-10 LAB — CBC WITH DIFFERENTIAL/PLATELET
Abs Immature Granulocytes: 0.44 10*3/uL — ABNORMAL HIGH (ref 0.00–0.07)
Basophils Absolute: 0.1 10*3/uL (ref 0.0–0.1)
Basophils Relative: 1 %
Eosinophils Absolute: 0.4 10*3/uL (ref 0.0–0.5)
Eosinophils Relative: 4 %
HCT: 30.6 % — ABNORMAL LOW (ref 39.0–52.0)
Hemoglobin: 9.8 g/dL — ABNORMAL LOW (ref 13.0–17.0)
Immature Granulocytes: 4 %
Lymphocytes Relative: 15 %
Lymphs Abs: 1.8 10*3/uL (ref 0.7–4.0)
MCH: 30.4 pg (ref 26.0–34.0)
MCHC: 32 g/dL (ref 30.0–36.0)
MCV: 95 fL (ref 80.0–100.0)
Monocytes Absolute: 0.9 10*3/uL (ref 0.1–1.0)
Monocytes Relative: 8 %
Neutro Abs: 8.7 10*3/uL — ABNORMAL HIGH (ref 1.7–7.7)
Neutrophils Relative %: 68 %
Platelets: 405 10*3/uL — ABNORMAL HIGH (ref 150–400)
RBC: 3.22 MIL/uL — ABNORMAL LOW (ref 4.22–5.81)
RDW: 13.1 % (ref 11.5–15.5)
WBC: 12.4 10*3/uL — ABNORMAL HIGH (ref 4.0–10.5)
nRBC: 0.2 % (ref 0.0–0.2)

## 2020-10-10 NOTE — Progress Notes (Signed)
[facility] PHYSICAL MEDICINE & REHABILITATION PROGRESS NOTE   Subjective/Complaints:   Pt reports doesn't feel like needs to void, however was just bladder scanned for 360cc- NT asked pt to try to go- Feels good other than not able to take Meloxicam daily- hurting from arthritis.   Has tramadol 50-100 mg q6 hours prn, however meloxicam  helps more.    ROS:  Pt denies SOB, abd pain, CP, N/V/C/D, and vision changes   Objective:   No results found. Recent Labs    10/08/20 0310 10/10/20 0415  WBC 9.1 12.4*  HGB 8.7* 9.8*  HCT 26.1* 30.6*  PLT 307 405*   Recent Labs    10/08/20 0310 10/10/20 0415  NA 135 134*  K 3.8 3.5  CL 104 100  CO2 24 23  GLUCOSE 115* 137*  BUN 17 13  CREATININE 0.70 0.74  CALCIUM 8.2* 8.7*    Intake/Output Summary (Last 24 hours) at 10/10/2020 1610 Last data filed at 10/10/2020 9604 Gross per 24 hour  Intake 240 ml  Output 875 ml  Net -635 ml        Physical Exam: Vital Signs Blood pressure (!) 120/93, pulse 86, temperature 98.6 F (37 C), resp. rate 16, height 5\' 11"  (1.803 m), weight 85.5 kg, SpO2 100 %.   Constitutional:  older pt sitting up 90 degrees in bed- getting bladder scanned, NAD  HENT: conjugate gaze.  Cardiovascular: RRR, no JVD.  Pulmonary: CTA B/L- no W/R/R- good air movement Abdominal: soft, NT< ND, except maybe a little around pelvis/bladder, hypoactive BS Musculoskeletal:     Cervical back: Normal range of motion and neck supple.     Comments: Right knee with edema and tenderness Intrinsic muscle atrophy  Skin:    General: Skin is warm and dry.     Comments: Right knee with dressing CDI -still has original surgical dressing in place Neurological: Ox3    Comments: Alert and oriented Motor: B/l UE: 5/5 proximal to distal RLE: HF 3-/5, KE limited, ADF 4/5 (pain inhibition) LLE: HF, KE 4-/5, ADF 4/5  Psychiatric:  appropriate, joking some     Assessment/Plan: 1. Functional deficits which require 3+  hours per day of interdisciplinary therapy in a comprehensive inpatient rehab setting.  Physiatrist is providing close team supervision and 24 hour management of active medical problems listed below.  Physiatrist and rehab team continue to assess barriers to discharge/monitor patient progress toward functional and medical goals  Care Tool:  Bathing              Bathing assist       Upper Body Dressing/Undressing Upper body dressing        Upper body assist      Lower Body Dressing/Undressing Lower body dressing            Lower body assist Assist for lower body dressing: Maximal Assistance - Patient 25 - 49%     Toileting Toileting    Toileting assist Assist for toileting: 2 Helpers     Transfers Chair/bed transfer  Transfers assist     Chair/bed transfer assist level: Moderate Assistance - Patient 50 - 74%     Locomotion Ambulation   Ambulation assist              Walk 10 feet activity   Assist           Walk 50 feet activity   Assist           Walk 150 feet  activity   Assist           Walk 10 feet on uneven surface  activity   Assist           Wheelchair     Assist               Wheelchair 50 feet with 2 turns activity    Assist            Wheelchair 150 feet activity     Assist          Blood pressure (!) 120/93, pulse 86, temperature 98.6 F (37 C), resp. rate 16, height 5\' 11"  (1.803 m), weight 85.5 kg, SpO2 100 %.  Medical Problem List and Plan: 1.  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             -patient shower             -ELOS/Goals: 13-16 days/Supervision/Mod I             Admit to CIR 2.  Antithrombotics: -DVT/anticoagulation:  Mechanical: Sequential compression devices, below knee Bilateral lower extremities. Will check dopplers in am due to poor mobility.              -antiplatelet  therapy: ASA/Plavix 3. Pain Management: Oxycodone, tramadol and/or robaxin prn.   11/25- usually took Meloxicam at home, but with embolic strokes, will need to check with Neurology if they are OK with restarting Meloxicam at all.  4. Mood: LCSW to follow for evaluation and support.              -antipsychotic agents: N/A 5. Neuropsych: This patient is capable of making decisions on his own behalf. 6. Skin/Wound Care: Monitor incision for healing. Add protein supplement to help promote wound healing.  7. Fluids/Electrolytes/Nutrition: Monitor I/O.              CMP ordered for tomorrow AM  11/25- looking OK 8. Dizziness: Monitor orthostatic vitals. Encourage fluid intake. 9. ABLA: H/H with steady downward trend. 13.9-->8.7.              Stool guaiacs ordered             CBC ordered for tomorrow AM             Iron supplement added  11/15- Hb 9.8- better 10. Statin induced myositis: Abnormal LFTs being monitored.               CMP ordered for tomorrow AM   11/25- AST and ALT up very slightly- will con't to follow since just minor elevation.  11. Drug induced constipation: Resolved--and now with diarrhea likely multifactorial.  Will hold bowel meds 12. Proteus UTI:  IV Rocephin X 3 days-->Keflex thorough 11/27 per recs.  11/25- still has WBC that's actually new ly elevated- will check 1 more time U/A and Cx- to make sure hasn't picked up something else.   13. Urinary retention: Continue Flomax.  Plan to d/c foley tomorrow with voiding trial  11/25- bladder scanned for 360 cc this AM- not sure if pt voided- but has orders for in/out caths if required.     LOS: 1 days A FACE TO FACE EVALUATION WAS PERFORMED  Justin Rose 10/10/2020, 9:22 AM

## 2020-10-11 ENCOUNTER — Inpatient Hospital Stay (HOSPITAL_COMMUNITY): Payer: Medicare HMO | Admitting: Occupational Therapy

## 2020-10-11 ENCOUNTER — Inpatient Hospital Stay (HOSPITAL_COMMUNITY): Payer: Medicare HMO | Admitting: Physical Therapy

## 2020-10-11 DIAGNOSIS — M1711 Unilateral primary osteoarthritis, right knee: Secondary | ICD-10-CM | POA: Diagnosis not present

## 2020-10-11 DIAGNOSIS — M609 Myositis, unspecified: Secondary | ICD-10-CM

## 2020-10-11 DIAGNOSIS — M6080 Other myositis, unspecified site: Secondary | ICD-10-CM

## 2020-10-11 DIAGNOSIS — G8918 Other acute postprocedural pain: Secondary | ICD-10-CM

## 2020-10-11 DIAGNOSIS — R42 Dizziness and giddiness: Secondary | ICD-10-CM

## 2020-10-11 DIAGNOSIS — I951 Orthostatic hypotension: Secondary | ICD-10-CM

## 2020-10-11 DIAGNOSIS — Z96651 Presence of right artificial knee joint: Secondary | ICD-10-CM

## 2020-10-11 DIAGNOSIS — D62 Acute posthemorrhagic anemia: Secondary | ICD-10-CM

## 2020-10-11 DIAGNOSIS — N390 Urinary tract infection, site not specified: Secondary | ICD-10-CM | POA: Diagnosis not present

## 2020-10-11 DIAGNOSIS — R339 Retention of urine, unspecified: Secondary | ICD-10-CM

## 2020-10-11 DIAGNOSIS — K5903 Drug induced constipation: Secondary | ICD-10-CM

## 2020-10-11 LAB — URINE CULTURE

## 2020-10-11 MED ORDER — ASPIRIN 81 MG PO CHEW
81.0000 mg | CHEWABLE_TABLET | Freq: Every day | ORAL | Status: DC
  Administered 2020-10-11 – 2020-10-12 (×2): 81 mg via ORAL
  Filled 2020-10-11 (×2): qty 1

## 2020-10-11 MED ORDER — VITAMIN B-12 1000 MCG PO TABS
1000.0000 ug | ORAL_TABLET | Freq: Every day | ORAL | Status: DC
  Administered 2020-10-11 – 2020-10-22 (×12): 1000 ug via ORAL
  Filled 2020-10-11 (×12): qty 1

## 2020-10-11 MED ORDER — METHOCARBAMOL 500 MG PO TABS
500.0000 mg | ORAL_TABLET | Freq: Four times a day (QID) | ORAL | Status: DC | PRN
  Administered 2020-10-14 – 2020-10-18 (×4): 500 mg via ORAL
  Filled 2020-10-11 (×5): qty 1

## 2020-10-11 NOTE — Progress Notes (Signed)
Inpatient Rehabilitation  Patient information reviewed and entered into eRehab system by Robbin Loughmiller M. Dayne Chait, M.A., CCC/SLP, PPS Coordinator.  Information including medical coding, functional ability and quality indicators will be reviewed and updated through discharge.    

## 2020-10-11 NOTE — Progress Notes (Signed)
[facility] PHYSICAL MEDICINE & REHABILITATION PROGRESS NOTE   Subjective/Complaints: Patient seen sitting up in bed this morning.  He states he slept well overnight.  He notes he had good first session with therapy this morning.  Discussed ultrasound for DVT with nursing.  ROS: Denies CP, SOB, N/V/D  Objective:   No results found. Recent Labs    10/10/20 0415  WBC 12.4*  HGB 9.8*  HCT 30.6*  PLT 405*   Recent Labs    10/10/20 0415  NA 134*  K 3.5  CL 100  CO2 23  GLUCOSE 137*  BUN 13  CREATININE 0.74  CALCIUM 8.7*    Intake/Output Summary (Last 24 hours) at 10/11/2020 0959 Last data filed at 10/11/2020 0700 Gross per 24 hour  Intake 1020 ml  Output 1400 ml  Net -380 ml        Physical Exam: Vital Signs Blood pressure (!) 143/89, pulse 82, temperature 97.8 F (36.6 C), temperature source Oral, resp. rate 19, height 5\' 11"  (1.803 m), weight 85.5 kg, SpO2 100 %. Constitutional: No distress . Vital signs reviewed. HENT: Normocephalic.  Atraumatic. Eyes: EOMI. No discharge. Cardiovascular: No JVD.  RRR. Respiratory: Normal effort.  No stridor.  Bilateral clear to auscultation. GI: Non-distended.  BS +. Skin: Warm and dry.  Right knee with dressing CDI Psych: Normal mood.  Normal behavior. Musc: Right knee with improving edema and tenderness Intrinsic muscle atrophy  Neuro: Alert and oriented Motor: B/l UE: 5/5 proximal to distal RLE: HF 4-/5, KE limited by brace, ADF 4/5 (some pain inhibition) LLE: HF, KE 4/5, ADF 4/5   Assessment/Plan: 1. Functional deficits which require 3+ hours per day of interdisciplinary therapy in a comprehensive inpatient rehab setting.  Physiatrist is providing close team supervision and 24 hour management of active medical problems listed below.  Physiatrist and rehab team continue to assess barriers to discharge/monitor patient progress toward functional and medical goals  Care Tool:  Bathing              Bathing  assist       Upper Body Dressing/Undressing Upper body dressing   What is the patient wearing?: Hospital gown only    Upper body assist Assist Level: Maximal Assistance - Patient 25 - 49%    Lower Body Dressing/Undressing Lower body dressing            Lower body assist Assist for lower body dressing: Maximal Assistance - Patient 25 - 49%     Toileting Toileting    Toileting assist Assist for toileting: Maximal Assistance - Patient 25 - 49%     Transfers Chair/bed transfer  Transfers assist     Chair/bed transfer assist level: Minimal Assistance - Patient > 75%     Locomotion Ambulation   Ambulation assist      Assist level: Minimal Assistance - Patient > 75% Assistive device: Walker-rolling Max distance: 50   Walk 10 feet activity   Assist     Assist level: Minimal Assistance - Patient > 75%     Walk 50 feet activity   Assist    Assist level: Minimal Assistance - Patient > 75% Assistive device: Walker-rolling    Walk 150 feet activity   Assist Walk 150 feet activity did not occur: Safety/medical concerns         Walk 10 feet on uneven surface  activity   Assist Walk 10 feet on uneven surfaces activity did not occur: Safety/medical concerns  Wheelchair     Assist Will patient use wheelchair at discharge?: No   Wheelchair activity did not occur: N/A (2/2 WC only used for transport during stay, currently has ambulation goals)         Wheelchair 50 feet with 2 turns activity    Assist    Wheelchair 50 feet with 2 turns activity did not occur: N/A       Wheelchair 150 feet activity     Assist  Wheelchair 150 feet activity did not occur: N/A       Blood pressure (!) 143/89, pulse 82, temperature 97.8 F (36.6 C), temperature source Oral, resp. rate 19, height 5\' 11"  (1.803 m), weight 85.5 kg, SpO2 100 %.  Medical Problem List and Plan: 1.  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  Begin CIR evaluations 2.  Antithrombotics: -DVT/anticoagulation:  Mechanical: Sequential compression devices, below knee Bilateral lower extremities.   Venous Dopplers pending             -antiplatelet therapy: ASA/Plavix 3. Pain Management: Oxycodone, tramadol and/or robaxin prn.   Monitor with increased exertion 4. Mood: LCSW to follow for evaluation and support.              -antipsychotic agents: N/A 5. Neuropsych: This patient is capable of making decisions on his own behalf. 6. Skin/Wound Care: Monitor incision for healing.  Added protein supplement to help promote wound healing.  7. Fluids/Electrolytes/Nutrition: Monitor I/Os.  8. Dizziness: Monitor orthostatic vitals. Encourage fluid intake.  Orthostatics positive on 11/26  Compression stockings/abdominal binder as necessary 9. ABLA:              Stool guaiacs pending             Iron supplement added  Hemoglobin 9.8 on 11/25, labs ordered for Monday  Continue to monitor 10. Statin induced myositis:   LFTs elevated on 11/25, labs ordered for Monday 11. Drug induced constipation:   Hold bowel meds due to diarrhea 12. Proteus UTI:  IV Rocephin X 3 days-->Keflex thorough 11/27 per recs.  Repeat UA normal  Reviewed urine culture with multiple species 13. Urinary retention: Continue Flomax.    Improving  LOS: 2 days A FACE TO FACE EVALUATION WAS PERFORMED  Tifini Reeder Lorie Phenix 10/11/2020, 9:59 AM

## 2020-10-11 NOTE — Progress Notes (Signed)
Vascular called unit to Inform and question order for new doppler to RLE due to having doppler done on 11/17 and results  Presented negative. MD Posey Pronto was consulted as to whether test needed to be repeated. MD stated to repeat the test. Vascular called and left message x 2 to call unit to give go ahead to perform test. Nurse has not heard back from vascular;ar as of end of shift.Will follow up on 10/12/2020.Sanda Linger, LPN

## 2020-10-11 NOTE — Plan of Care (Signed)
  Problem: RH Balance Goal: LTG: Patient will maintain dynamic sitting balance (OT) Description: LTG:  Patient will maintain dynamic sitting balance with assistance during activities of daily living (OT) Flowsheets (Taken 10/11/2020 1131) LTG: Pt will maintain dynamic sitting balance during ADLs with: Independent Goal: LTG Patient will maintain dynamic standing with ADLs (OT) Description: LTG:  Patient will maintain dynamic standing balance with assist during activities of daily living (OT)  Flowsheets (Taken 10/11/2020 1131) LTG: Pt will maintain dynamic standing balance during ADLs with: Supervision/Verbal cueing   Problem: Sit to Stand Goal: LTG:  Patient will perform sit to stand in prep for activites of daily living with assistance level (OT) Description: LTG:  Patient will perform sit to stand in prep for activites of daily living with assistance level (OT) Flowsheets (Taken 10/11/2020 1131) LTG: PT will perform sit to stand in prep for activites of daily living with assistance level: Supervision/Verbal cueing   Problem: RH Grooming Goal: LTG Patient will perform grooming w/assist,cues/equip (OT) Description: LTG: Patient will perform grooming with assist, with/without cues using equipment (OT) Flowsheets (Taken 10/11/2020 1131) LTG: Pt will perform grooming with assistance level of: Set up assist    Problem: RH Bathing Goal: LTG Patient will bathe all body parts with assist levels (OT) Description: LTG: Patient will bathe all body parts with assist levels (OT) Flowsheets (Taken 10/11/2020 1131) LTG: Pt will perform bathing with assistance level/cueing: Supervision/Verbal cueing   Problem: RH Dressing Goal: LTG Patient will perform upper body dressing (OT) Description: LTG Patient will perform upper body dressing with assist, with/without cues (OT). Flowsheets (Taken 10/11/2020 1131) LTG: Pt will perform upper body dressing with assistance level of: Set up assist Goal: LTG  Patient will perform lower body dressing w/assist (OT) Description: LTG: Patient will perform lower body dressing with assist, with/without cues in positioning using equipment (OT) Flowsheets (Taken 10/11/2020 1131) LTG: Pt will perform lower body dressing with assistance level of: Supervision/Verbal cueing   Problem: RH Toileting Goal: LTG Patient will perform toileting task (3/3 steps) with assistance level (OT) Description: LTG: Patient will perform toileting task (3/3 steps) with assistance level (OT)  Flowsheets (Taken 10/11/2020 1131) LTG: Pt will perform toileting task (3/3 steps) with assistance level: Supervision/Verbal cueing   Problem: RH Toilet Transfers Goal: LTG Patient will perform toilet transfers w/assist (OT) Description: LTG: Patient will perform toilet transfers with assist, with/without cues using equipment (OT) Flowsheets (Taken 10/11/2020 1131) LTG: Pt will perform toilet transfers with assistance level of: Supervision/Verbal cueing   Problem: RH Tub/Shower Transfers Goal: LTG Patient will perform tub/shower transfers w/assist (OT) Description: LTG: Patient will perform tub/shower transfers with assist, with/without cues using equipment (OT) Flowsheets (Taken 10/11/2020 1131) LTG: Pt will perform tub/shower stall transfers with assistance level of: Supervision/Verbal cueing

## 2020-10-11 NOTE — Progress Notes (Signed)
Inpatient [facility] Individual Statement of Services  Patient Name:  Justin Rose  Date:  10/11/2020  Welcome to the [facility].  Our goal is to provide you with an individualized program based on your diagnosis and situation, designed to meet your specific needs.  With this comprehensive rehabilitation program, you will be expected to participate in at least 3 hours of rehabilitation therapies Monday-Friday, with modified therapy programming on the weekends.  Your rehabilitation program will include the following services:  Physical Therapy (PT), Occupational Therapy (OT), Speech Therapy (ST), 24 hour per day rehabilitation nursing, Therapeutic Recreaction (TR), Neuropsychology, Care Coordinator, Rehabilitation Medicine, Nutrition Services, Pharmacy Services and Other  Weekly team conferences will be held on Tuesday to discuss your progress.  Your Inpatient Rehabilitation Care Coordinator will talk with you frequently to get your input and to update you on team discussions.  Team conferences with you and your family in attendance may also be held.  Expected length of stay: 6-9 Days  Overall anticipated outcome:  Supervision  Depending on your progress and recovery, your program may change. Your Inpatient Rehabilitation Care Coordinator will coordinate services and will keep you informed of any changes. Your Inpatient Rehabilitation Care Coordinator's name and contact numbers are listed  below.  The following services may also be recommended but are not provided by the [facility]:    [facility] will be made to provide these services after discharge if needed.  Arrangements include referral to agencies that provide these services.  Your insurance has been verified to be:  [employer] Your primary doctor is:  Charlcie Cradle, MD  Pertinent information will be  shared with your doctor and your insurance company.  Inpatient Rehabilitation Care Coordinator:  Erlene Quan, [place] or [phone]  Information discussed with and copy given to patient by: Dyanne Iha, 10/11/2020, 10:49 AM

## 2020-10-11 NOTE — Plan of Care (Signed)
°  Problem: Consults Goal: RH STROKE PATIENT EDUCATION Description: See Patient Education module for education specifics  Outcome: Progressing   Problem: RH SKIN INTEGRITY Goal: RH STG SKIN FREE OF INFECTION/BREAKDOWN Description: Assess skin q shift and as needed Outcome: Progressing   Problem: RH SAFETY Goal: RH STG ADHERE TO SAFETY PRECAUTIONS W/ASSISTANCE/DEVICE Description: STG Adhere to Safety Precautions With Mod I Assistance/Device. Outcome: Progressing Goal: RH STG DECREASED RISK OF FALL WITH ASSISTANCE Description: STG Decreased Risk of Fall With Mod I Assistance. Outcome: Progressing

## 2020-10-11 NOTE — IPOC Note (Signed)
Individualized overall Plan of Care [facility]) Patient Details Name: Justin Rose MRN: [id number] DOB: [birth year]  Admitting Diagnosis: OA (osteoarthritis) of knee  Hospital Problems: Principal Problem:   OA (osteoarthritis) of knee Active Problems:   Total knee replacement status   Myositis   Orthostatic hypotension   Postoperative pain     Functional Problem List: Nursing Medication Management, Motor, Pain, Perception, Safety, Skin Integrity  PT Balance, Endurance, Pain, Safety, Skin Integrity  OT Balance, Cognition, Edema, Endurance, Motor, Safety  SLP    TR         Basic ADL's: OT Grooming, Bathing, Dressing, Toileting     Advanced  ADL's: OT       Transfers: PT Bed Mobility, Bed to Chair, Car, Chief Operating Officer: PT Ambulation, Emergency planning/management officer, Stairs     Additional Impairments: OT None  SLP        TR      Anticipated Outcomes Item Anticipated Outcome  Self Feeding no goal Research officer, political party Transfers Supervision  Bowel/Bladder  Remain continent of bowel/bladder with mod I  Transfers  supervision  Locomotion  supervision  Communication     Cognition     Pain  <2  Safety/Judgment  Able to follow all safety precautions with Mod I   Therapy Plan: PT Intensity: Minimum of 1-2 x/day ,45 to 90 minutes PT Frequency: 5 out of 7 days PT Duration Estimated Length of Stay: 10-14days OT Intensity: Minimum of 1-2 x/day, 45 to 90 minutes OT Frequency: 5 out of 7 days OT Duration/Estimated Length of Stay: 7-10 days      Team Interventions: Nursing Interventions Patient/Family Education, Disease Management/Prevention, Pain Management, Medication Management, Skin Care/Wound Management, Discharge Planning  PT interventions Ambulation/gait training, Discharge planning, Functional mobility training, Psychosocial support, Therapeutic Activities, Visual/perceptual  remediation/compensation, Wheelchair propulsion/positioning, Therapeutic Exercise, Skin care/wound management, Neuromuscular re-education, Balance/vestibular training, Disease management/prevention, Cognitive remediation/compensation, DME/adaptive equipment instruction, Pain management, UE/LE Strength taining/ROM, Splinting/orthotics, UE/LE Coordination activities, Stair training, Patient/family education, Community reintegration  OT Interventions Training and development officer, Discharge planning, Self Care/advanced ADL retraining, Therapeutic Activities, UE/LE Coordination activities, Visual/perceptual remediation/compensation, Therapeutic Exercise, Skin care/wound managment, Patient/family education, Functional mobility training, Disease mangement/prevention, Community reintegration, Engineer, drilling, Neuromuscular re-education, Psychosocial support, UE/LE Strength taining/ROM  SLP Interventions    TR Interventions    SW/CM Interventions Discharge Planning, Psychosocial Support   Barriers to Discharge MD  Medical stability and Wound care  Nursing      PT Inaccessible home environment, Decreased caregiver support wife can assist but not physically  OT      SLP      SW       Team Discharge Planning: Destination: PT-Home ,OT- Home , SLP-  Projected Follow-up: PT-Home health PT, OT-  Home health OT, SLP-  Projected Equipment Needs: PT- , OT- 3 in 1 bedside comode, Tub/shower seat, To be determined, SLP-  Equipment Details: PT-pt has cane, crutches, rolling walker, manual WC, grab bars in bathroom, OT-Pt will need shower seat, unclear if want to purchase independently or thorugh hospital Patient/family involved in discharge planning: PT- Patient,  OT-Patient, Family member/caregiver, SLP-   MD ELOS: 7-11 days. Medical Rehab Prognosis:  Excellent Assessment: 86 y.o. male with history of glaucoma, BCC, OA bilateral Knees who was admitted on 09/30/20 for R-TKR by Dr. Wynelle Link. CT  head unremarkable for acute intracranial process. Neurology consulted for input and  MRI brain done revealing numerous small scattered acute cortical and subcortical infarcts in bilateral frontal, parietal and occipital lobes felt to be embolic.  CTA head/neck was negative for LVO or significant stenosis. BLE dopplers negative for DVT. Echocardiogram with EF of 60-65%. Dr. 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  Hospital course further complicated by urinary retention and foley placed. Urine culture positive for proteus mirabilis.  He was started on lipitor but developed myalgias with elevated CK and abormal LFTs felt to be due to statin induced myositis treated with IVF with improvement in muscle pain. Therapy evaluations revealed mild cognitive impairment as well as LLE limitation due to recent TKR.  We will set goals for supervision with PT/OT.   Due to the current state of emergency, patients may not be receiving their 3-hours of Medicare-mandated therapy.  See Team Conference Notes for weekly updates to the plan of care

## 2020-10-11 NOTE — Evaluation (Signed)
Physical Therapy Assessment and Plan  Patient Details  Name: Justin Rose MRN: [id number] Date of Birth: 02-27-1934  PT Diagnosis: Abnormal posture, Abnormality of gait, Difficulty walking, Muscle weakness, Osteoarthritis and Pain in Rt elbow. denied pain in R knee Rehab Potential: Good ELOS: 10-14 days  Today's Date: 10/11/2020 PT Individual Time: 2202-5427 and 1235-1350 PT Individual Time Calculation (min): 60 min  And 75 mins  Hospital Problem: Principal Problem:   OA (osteoarthritis) of knee Active Problems:   Total knee replacement status   Past Medical History:  Past Medical History:  Diagnosis Date  . Arthritis   . Basal cell carcinoma (BCC) of head   . Basal cell carcinoma, ear    Left ear  . Glaucoma    Left eye   Past Surgical History:  Past Surgical History:  Procedure Laterality Date  . BACK SURGERY     2021  . BASAL CELL CARCINOMA EXCISION    . EYE SURGERY     08/2020 left eye  . HAND SURGERY    . PARATHYROIDECTOMY    . TONSILLECTOMY    . TOTAL KNEE ARTHROPLASTY Right 09/30/2020   Procedure: TOTAL KNEE ARTHROPLASTY;  Surgeon: Gaynelle Arabian, MD;  Location: [facility];  Service: Orthopedics;  Laterality: Right;    Assessment & Plan Clinical Impression: Patient is a 86 y.o. year old male with history of glaucoma, BCC, OA bilateral Knees who was admitted on 09/30/20 for R-TKR by Dr. Wynelle Link. History taken from chart review and patient. Posts op he reported dizziness with left elbow numbness as well as visual deficits with floaters as well as transient speech difficutly.  CT head unremarkable for acute intracranial process. 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 CTA head/neck was negative for LVO or significant stenosis. BLE dopplers negative for DVT. Echocardiogram with EF of 60-65%. Dr. Erlinda Hong felt that stroke was  embolic due to unknown source and recommended DAPT --patient refused ILR therefore 30 day cardiac monitor recommended after discharge. Hospital course further complicated by urinary retention and foley placed. Urine culture positive for proteus mirabilis.  He was started on lipitor but developed myalgias with elevated CK and abormal LFTs felt to be due to statin induced myositis treated with IVF with improvement in muscle pain. Therapy evaluations revealed mild cognitive impairment as well as LLE limitation due to recent TKR. CIR recommended due to functional deficits. Please see preadmission assessment from earlier today as well.  Patient transferred to CIR on 10/09/2020 .   Patient currently requires min with mobility secondary to muscle weakness and muscle joint tightness, decreased cardiorespiratoy endurance and decreased sitting balance, decreased standing balance and decreased balance strategies.  Prior to hospitalization, patient was independent  with mobility and lived with Spouse in a House home.  Home access is 3 and one step inside home to enter living roomStairs to enter.  Patient will benefit from skilled PT intervention to maximize safe functional mobility, minimize fall risk and decrease caregiver burden for planned discharge home with 24 hour supervision.  Anticipate patient will benefit from follow up [place] at discharge.  PT - End of Session Activity Tolerance: Tolerates 30+ min activity with multiple rests Endurance Deficit: Yes PT Assessment Rehab Potential (ACUTE/IP ONLY): Good PT Barriers to Discharge: Inaccessible home environment;Decreased caregiver support PT Barriers to Discharge Comments: wife can assist but not physically PT Patient demonstrates impairments in the following  area(s): Balance;Endurance;Pain;Safety;Skin Integrity PT Transfers Functional Problem(s): Bed Mobility;Bed to Chair;Car;Furniture PT Locomotion Functional Problem(s): Ambulation;Wheelchair  Mobility;Stairs PT Plan PT Intensity: Minimum of 1-2 x/day ,45 to 90 minutes PT Frequency: 5 out of 7 days PT Duration Estimated Length of Stay: 2 weeks PT Treatment/Interventions: Ambulation/gait training;Discharge planning;Functional mobility training;Psychosocial support;Therapeutic Activities;Visual/perceptual remediation/compensation;Wheelchair propulsion/positioning;Therapeutic Exercise;Skin care/wound management;Neuromuscular re-education;Balance/vestibular training;Disease management/prevention;Cognitive remediation/compensation;DME/adaptive equipment instruction;Pain management;UE/LE Strength taining/ROM;Splinting/orthotics;UE/LE Coordination activities;Stair training;Patient/family education;Community reintegration PT Transfers Anticipated Outcome(s): supervision PT Locomotion Anticipated Outcome(s): supervision PT Recommendation Recommendations for Other Services: Therapeutic Recreation consult Therapeutic Recreation Interventions: Stress management;Outing/community reintergration Follow Up Recommendations: Home health PT Patient destination: Home Equipment Details: pt has cane, crutches, rolling walker, manual WC, grab bars in bathroom   PT Evaluation Precautions/Restrictions Precautions Precautions: Fall Required Braces or Orthoses: Knee Immobilizer - Right Restrictions Weight Bearing Restrictions: No RLE Weight Bearing: Weight bearing as tolerated General   Vital Signs Pain Pain Assessment Pain Scale: 0-10 Pain Score: 0-No pain Home Living/Prior Functioning Home Living Available Help at Discharge: Family;Available PRN/intermittently;Neighbor Type of Home: House Home Access: Stairs to enter [employer] of Steps: 3 and one step inside home to enter living room Entrance Stairs-Rails: Left Home Layout: One level Bathroom Shower/Tub: Multimedia programmer: Handicapped height Bathroom Accessibility: Yes Additional Comments: wife named [name]  Lives  With: Spouse Prior Function Level of Independence: Independent with basic ADLs;Independent with gait;Independent with homemaking with ambulation;Independent with transfers  Able to Take Stairs?: Yes Driving: Yes Comments: reports enjoys playing golf and does all the yard work Vision/Perception  Geologist, engineering: Within Advertising copywriter Praxis Praxis: Intact  Cognition Overall Cognitive Status: Impaired/Different from baseline Arousal/Alertness: Awake/alert Orientation Level: Oriented X4 Attention: Focused;Sustained Focused Attention: Appears intact Sustained Attention: Appears intact Selective Attention Impairment: Verbal basic Problem Solving: Impaired Problem Solving Impairment: Verbal complex Safety/Judgment: Appears intact Sensation Sensation Light Touch: Appears Intact Coordination Gross Motor Movements are Fluid and Coordinated: No Fine Motor Movements are Fluid and Coordinated: Yes Motor  Motor Motor: Within Functional Limits   Trunk/Postural Assessment  Thoracic Assessment Thoracic Assessment:  (kyphotic) Lumbar Assessment Lumbar Assessment:  (posterior pelvic tilt)  Balance Static Sitting Balance Static Sitting - Balance Support: Right upper extremity supported;Feet supported Static Sitting - Level of Assistance: 5: Stand by assistance Dynamic Sitting Balance Dynamic Sitting - Balance Support: Right upper extremity supported;Feet supported Dynamic Sitting - Level of Assistance: 5: Stand by assistance Dynamic Sitting - Balance Activities: Lateral lean/weight shifting;Forward lean/weight shifting Static Standing Balance Static Standing - Balance Support: Bilateral upper extremity supported Static Standing - Level of Assistance: 4: Min assist Dynamic Standing Balance Dynamic Standing - Balance Support: During functional activity Dynamic Standing - Level of Assistance: 3: Mod assist Extremity Assessment      RLE Assessment RLE Assessment: Exceptions  to [facility] Passive Range of Motion (PROM) Comments: limited in knee flexion  2/2 pain, can achieve 25% ROM, can achieve [facility] of knee extension Active Range of Motion (AROM) Comments: limited in knee flexion  2/2 pain, can achieve 15% ROM, can achieve [facility] of knee extension General Strength Comments: grossly 3+/5 LLE Assessment LLE Assessment: Exceptions to [facility] Passive Range of Motion (PROM) Comments: WFL Active Range of Motion (AROM) Comments: WFL General Strength Comments: 4+/5 grossly  Care Tool Care Tool Bed Mobility Roll left and right activity   Roll left and right assist level: Minimal Assistance - Patient > 75%    Sit to lying activity   Sit to lying assist level: Minimal Assistance - Patient > 75%  Lying to sitting edge of bed activity   Lying to sitting edge of bed assist level: Minimal Assistance - Patient > 75%     Care Tool Transfers Sit to stand transfer   Sit to stand assist level: Minimal Assistance - Patient > 75%    Chair/bed transfer   Chair/bed transfer assist level: Minimal Assistance - Patient > 75%     Toilet transfer   Assist Level: Minimal Assistance - Patient > 75%    Car transfer Car transfer activity did not occur: Safety/medical concerns        Care Tool Locomotion Ambulation   Assist level: Minimal Assistance - Patient > 75% Assistive device: Walker-rolling Max distance: 50  Walk 10 feet activity   Assist level: Minimal Assistance - Patient > 75%     Walk 50 feet with 2 turns activity   Assist level: Minimal Assistance - Patient > 75% Assistive device: Walker-rolling  Walk 150 feet activity Walk 150 feet activity did not occur: Safety/medical concerns      Walk 10 feet on uneven surfaces activity Walk 10 feet on uneven surfaces activity did not occur: Safety/medical concerns      Stairs Stair activity did not occur: Safety/medical concerns        Walk up/down 1 step activity Walk up/down 1 step or curb (drop down) activity did not  occur: Safety/medical concerns     Walk up/down 4 steps activity did not occuR: Safety/medical concerns  Walk up/down 4 steps activity      Walk up/down 12 steps activity Walk up/down 12 steps activity did not occur: Safety/medical concerns      Pick up small objects from floor Pick up small object from the floor (from standing position) activity did not occur: Safety/medical concerns      Wheelchair Will patient use wheelchair at discharge?: No   Wheelchair activity did not occur: N/A (2/2 WC only used for transport during stay, currently has ambulation goals)      Wheel 50 feet with 2 turns activity Wheelchair 50 feet with 2 turns activity did not occur: N/A    Wheel 150 feet activity Wheelchair 150 feet activity did not occur: N/A      Refer to Care Plan for Long Term Goals  SHORT TERM GOAL WEEK 1 PT Short Term Goal 1 (Week 1): pt to demonstrate supine<>sit CGA PT Short Term Goal 2 (Week 1): pt to demonstrate functional transfers CGA with LRAD PT Short Term Goal 3 (Week 1): pt to demonstrate gait training with LRAD for 100' CGA PT Short Term Goal 4 (Week 1): pt to improve ROM to R knee flexion to 60 degrees  Recommendations for other services: Therapeutic Recreation  Stress management  Skilled Therapeutic Intervention  Session 1: Evaluation completed (see details above and below) with education on PT POC and goals and individual treatment initiated with focus on  Bed mobility, transfer training, gait training, safety awareness, call light use, WB status. pt received in bed and agreeable to therapy. Pt very pleasant and agreeable to all therapy, very motivated to participate. Pt knee immobilizer off when PT entered room, pt denied to put it on for transfer reporting he did not have to wear it per surgeon , pt educated order was still in computer and d/t PT would feel more comfortable if pt worse KI for mobility. Pt agreed.  Pt directed in supine<>sit x2 min A from slightly  elevated HOB with VC for technique. pt reported he needed to use  urinal in standing, min A Sit to stand from bedside from slightly elevated bed to Rolling walker pt able to void in standing with total A for urinal placement and management.  Pt then directed to return to sitting min A, then transferred to supine for rest break and to support LE, min A. Post rest break, pt directed in supine>sit min A, directed in Sit to stand from EOB to Rolling walker then gait training with Rolling walker for 50' min A for stability VC for gait pattern and trunk extension. Pt directed in to sit in recliner, min A, alarm pad under pt and set, All needs in reach and in good condition. Call light in hand.  Nursing updated of pt's location and status. At end of session, nursing present post activity PT brought up slight redness in RLE below knee, nursing assessed and reported warmth and redness. Reported that pt is on medication for DVT prevention, and had (-) dopplers previously but would assess further. Pt left in recliner, KI in place.   Session 2: pt received in recliner and agreeable to therapy. KI in place. Pt denied pain at start of session.pt directed in Sit to stand from recliner min A to Rolling walker and step transfer to WC min A. Pt taken to ortho gym total A for time and energy. Pt directed in car transfer at pt's estimation of car height for DC home once able, directed in Sit to stand from [facility] to Rolling walker min A, x5 steps to car seat then car transfer min A, somewhat limited 2/2 KI on RLE, for transfer out of car to Rolling walker mod A with multiple attempts. Pt took prolonged rest break at this time in [facility] with RLE elevated. Pt reported 4/10 pain in R knee and reported he had not been hurting until today after multiple sessions with therapy.  Pt directed in resting RLE at this time, and directed in LLE strengthening ex 2.5# marching, LAQ 2x15. Pt then reported he felt better. Pt directed in [place] mobility to room,  130' min A. Sit to stand to Rolling walker min A and step transfer to recliner min A. Pt left in recliner, Bilateral lower extremity elevated, alarm set, All needs in reach and in good condition. Call light in hand.     Mobility Bed Mobility Bed Mobility: Rolling Right;Rolling Left;Supine to Sit;Sit to Supine;Sitting - Scoot to [employer] of Bed Rolling Right: Contact Guard/Touching assist Rolling Left: Contact Guard/Touching assist Supine to Sit: Minimal Assistance - Patient > 75% Sitting - Scoot to [employer] of Bed: Contact Guard/Touching assist Sit to Supine: Minimal Assistance - Patient > 75% Transfers Transfers: Sit to [employer] Transfers Sit to Stand: Minimal Assistance - Patient > 75% Stand Pivot Transfers: Minimal Assistance - Patient > 75% Stand Pivot Transfer Details: Verbal cues for sequencing;Verbal cues for technique;Verbal cues for precautions/safety;Verbal cues for safe use of DME/AE Transfer (Assistive device): Rolling walker Locomotion  Gait Ambulation: Yes Gait Distance (Feet): 50 Feet Assistive device: Rolling walker Gait Gait: Yes Gait Pattern: Step-to pattern;Trunk flexed;Decreased trunk rotation;Wide base of support;Decreased step length - right;Right foot flat Gait velocity: decreased Stairs / Additional Locomotion Stairs: No Ramp: Moderate Assistance - Patient 50 - 74% Wheelchair Mobility Wheelchair Mobility: 130' min A   Discharge Criteria: Patient will be discharged from PT if patient refuses treatment 3 consecutive times without medical reason, if treatment goals not met, if there is a change in medical status, if patient makes no progress towards goals or  if patient is discharged from hospital.  The above assessment, treatment plan, treatment alternatives and goals were discussed and mutually agreed upon: by patient  Junie Panning 10/11/2020, 8:59 AM

## 2020-10-11 NOTE — Evaluation (Signed)
Occupational Therapy Assessment and Plan  Patient Details  Name: Justin Rose MRN: [id number] Date of Birth: Aug 28, 1934  OT Diagnosis: abnormal posture, cognitive deficits, muscle weakness (generalized), pain in joint and new R TKR Rehab Potential:   ELOS: 7-10 days   Today's Date: 10/11/2020 OT Individual Time: 7939-0300 OT Individual Time Calculation (min): 78 min     Hospital Problem: Principal Problem:   OA (osteoarthritis) of knee Active Problems:   Total knee replacement status   Myositis   Orthostatic hypotension   Postoperative pain   Past Medical History:  Past Medical History:  Diagnosis Date  . Arthritis   . Basal cell carcinoma (BCC) of head   . Basal cell carcinoma, ear    Left ear  . Glaucoma    Left eye   Past Surgical History:  Past Surgical History:  Procedure Laterality Date  . BACK SURGERY     2021  . BASAL CELL CARCINOMA EXCISION    . EYE SURGERY     08/2020 left eye  . HAND SURGERY    . PARATHYROIDECTOMY    . TONSILLECTOMY    . TOTAL KNEE ARTHROPLASTY Right 09/30/2020   Procedure: TOTAL KNEE ARTHROPLASTY;  Surgeon: Gaynelle Arabian, MD;  Location: [facility];  Service: Orthopedics;  Laterality: Right;    Assessment & Plan Clinical Impression: Justin Rose a 86 y.o.malewith history of glaucoma, BCC, OA bilateral Knees who was admitted on 09/30/20 for R-TKR by Dr. Wynelle Link. History taken from chart review and patient. Posts op he reported dizziness with left elbow numbness as well as visual deficits with floaters as well as transient speech difficutly.  CT head unremarkable for acute intracranial process. 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 CTA head/neck was negative for LVO or significant stenosis. BLE dopplers negative for DVT. Echocardiogram with EF of 60-65%. Dr. 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 Hospital course further complicated by urinary retention and foley placed. Urine culture positive for proteus mirabilis.  He was started on lipitor but developed myalgias with elevated CK and abormal LFTs felt to be due to statin induced myositis treated with IVF with improvement in muscle pain. Therapy evaluations revealed mild cognitive impairment as well as LLE limitation due to recent TKR. CIR recommended due to functional deficits. Please see preadmission assessment from earlier today as well.  Patient transferred to CIR on 10/09/2020 .    Patient currently requires mod with basic self-care skills secondary to muscle weakness, decreased cardiorespiratoy endurance, decreased memory and decreased standing balance, decreased postural control and decreased balance strategies.  Prior to hospitalization, patient could complete BADL and IADLs with independent .  Patient will benefit from skilled intervention to decrease level of assist with basic self-care skills prior to discharge home with care partner.  Anticipate patient will require intermittent supervision and follow up home health.  OT - End of Session Activity Tolerance: Tolerates 30+ min activity with multiple rests Endurance Deficit: Yes OT Assessment OT Patient demonstrates impairments in the following area(s): Balance;Cognition;Edema;Endurance;Motor;Safety OT Basic ADL's Functional Problem(s): Grooming;Bathing;Dressing;Toileting OT Transfers Functional Problem(s): Toilet OT Additional Impairment(s): None OT Plan OT Intensity: Minimum of 1-2 x/day, 45 to 90 minutes OT Frequency: 5 out of 7 days OT Duration/Estimated Length of Stay: 7-10 days OT Treatment/Interventions: Balance/vestibular training;Discharge planning;Self Care/advanced  ADL retraining;Therapeutic Activities;UE/LE Coordination  activities;Visual/perceptual remediation/compensation;Therapeutic Exercise;Skin care/wound managment;Patient/family education;Functional mobility training;Disease mangement/prevention;Community reintegration;DME/adaptive equipment instruction;Neuromuscular re-education;Psychosocial support;UE/LE Strength taining/ROM OT Self Feeding Anticipated Outcome(s): no goal set OT Basic Self-Care Anticipated Outcome(s): Supervision OT Toileting Anticipated Outcome(s): Supervision OT Bathroom Transfers Anticipated Outcome(s): Supervision OT Recommendation Recommendations for Other Services: Neuropsych consult Patient destination: Home Follow Up Recommendations: Home health OT Equipment Recommended: 3 in 1 bedside comode;Tub/shower seat;To be determined Equipment Details: Pt will need shower seat, unclear if want to purchase independently or thorugh hospital   OT Evaluation Precautions/Restrictions  Precautions Precautions: Fall Precaution Comments: Right KI Required Braces or Orthoses: Knee Immobilizer - Right Restrictions Weight Bearing Restrictions: No RLE Weight Bearing: Weight bearing as tolerated Pain Pain Assessment Pain Scale: 0-10 Pain Score: 0-No pain Home Living/Prior Functioning Home Living Family/patient expects to be discharged to:: Private residence Living Arrangements: Spouse/significant other Available Help at Discharge: Family, Available PRN/intermittently, Neighbor Type of Home: House Home Access: Stairs to enter [employer] of Steps: 3 and one step inside home to enter living room Entrance Stairs-Rails: Left Home Layout: One level Bathroom Shower/Tub: Multimedia programmer: Handicapped height Bathroom Accessibility: Yes Additional Comments: wife named Environmental education officer  Lives With: Spouse IADL History Homemaking Responsibilities: Yes Homemaking Comments: Immunologist Occupation: Other (comment) 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, Independent with gait, Independent with homemaking with ambulation, Independent with transfers  Able to Take Stairs?: Yes Driving: Yes Comments: reports enjoys playing golf and does all the yard work Vision Baseline Vision/History: Wears glasses Wears Glasses: At all times Patient Visual Report: No change from baseline Vision Assessment?: Yes (generalized visual testing showed no new deficits, vision WFL in room) Perception  Perception: Within Functional Limits Praxis Praxis: Intact Cognition Overall Cognitive Status: Impaired/Different from baseline Arousal/Alertness: Awake/alert Orientation Level: Person;Place;Situation Person: Oriented Place: Oriented Situation: Oriented Year: 2021 Month: November Day of Week: Correct Immediate Memory Recall: Sock;Blue;Bed Memory Recall Sock: Without Cue Memory Recall Blue: Without Cue Memory Recall Bed: Without Cue Attention: Focused;Sustained Focused Attention: Appears intact Sustained Attention: Appears intact Selective Attention Impairment: Verbal basic Awareness: Appears intact Problem Solving: Impaired Problem Solving Impairment: Verbal complex Safety/Judgment: Appears intact Sensation Sensation Light Touch: Appears Intact Coordination Gross Motor Movements are Fluid and Coordinated: No Fine Motor Movements are Fluid and Coordinated: Yes Motor  Motor Motor: Within Functional Limits  Trunk/Postural Assessment  Cervical Assessment Cervical Assessment: Exceptions to [facility] (forward head) Thoracic Assessment Thoracic Assessment: Exceptions to [facility] (kyphotic posture) Lumbar Assessment Lumbar Assessment: Exceptions to [facility] (posterior pelvic tilt) Postural Control Postural Control: Within Functional Limits  Balance Balance Balance Assessed: Yes Static Sitting Balance Static Sitting - Balance Support: No upper extremity supported;Feet supported Static Sitting - Level of Assistance: 5:  Stand by assistance Dynamic Sitting Balance Dynamic Sitting - Balance Support: Feet supported;During functional activity;No upper extremity supported Dynamic Sitting - Level of Assistance: 5: Stand by assistance Dynamic Sitting - Balance Activities: Lateral lean/weight shifting;Forward lean/weight shifting;Reaching for objects Static Standing Balance Static Standing - Balance Support: Bilateral upper extremity supported Static Standing - Level of Assistance: 4: Min assist Dynamic Standing Balance Dynamic Standing - Balance Support: During functional activity;Left upper extremity supported Dynamic Standing - Level of Assistance: 3: Mod assist Dynamic Standing - Balance Activities: Lateral lean/weight shifting;Forward lean/weight shifting;Reaching for objects Extremity/Trunk Assessment RUE Assessment RUE Assessment: Within Functional Limits General Strength Comments: generalized weakness and deconditioning present, but WFL during ADL tasks, roughly 4-/5 LUE Assessment LUE Assessment: Within  Functional Limits General Strength Comments: generalized weakness and deconditioning present, but WFL during ADL tasks, roughly 4-/5  Care Tool Care Tool Self Care Eating    Set up    Oral Care     Set up    Bathing   Body parts bathed by patient: Right arm;Left arm;Chest;Abdomen;Front perineal area;Buttocks;Right upper leg;Left upper leg;Face Body parts bathed by helper: Left lower leg Body parts n/a: Right lower leg (in KI in standing, did not wash) Assist Level: Minimal Assistance - Patient > 75%    Upper Body Dressing(including orthotics)   What is the patient wearing?: Pull over shirt   Assist Level: Supervision/Verbal cueing    Lower Body Dressing (excluding footwear)     Assist for lower body dressing: Moderate Assistance - Patient 50 - 74%    Putting on/Taking off footwear   What is the patient wearing?: [place] for footwear: Dependent - Patient 0%       Care Tool  Toileting Toileting activity   Min A       Care Tool Bed Mobility Roll left and right activity   Roll left and right assist level: Minimal Assistance - Patient > 75%    Sit to lying activity   Sit to lying assist level: Minimal Assistance - Patient > 75%    Lying to sitting edge of bed activity   Lying to sitting edge of bed assist level: Minimal Assistance - Patient > 75%     Care Tool Transfers Sit to stand transfer   Sit to stand assist level: Minimal Assistance - Patient > 75%    Chair/bed transfer   Chair/bed transfer assist level: Minimal Assistance - Patient > 75%     Toilet transfer   Assist Level: Minimal Assistance - Patient > 75%     Care Tool Cognition Expression of Ideas and Wants Expression of Ideas and Wants: Some difficulty - exhibits some difficulty with expressing needs and ideas (e.g, some words or finishing thoughts) or speech is not clear   Understanding Verbal and Non-Verbal Content Understanding Verbal and Non-Verbal Content: Understands (complex and basic) - clear comprehension without cues or repetitions   Memory/Recall Ability *first 3 days only Memory/Recall Ability *first 3 days only: Current season;Location of own room;Staff names and faces;That he or she is in a hospital/hospital unit    Refer to Care Plan for [place] [facility] OT Short Term Goal 1 (Week 1): STGs = LTGs d/t ELOS at Supervision level  Recommendations for other services: Neuropsych   Pt greeted at time of session/eval sitting up in recliner with wife [name] present, agreeable to OT session and no c/o pain throughout. Pt did require rest breaks which were provided d/t fatigue. Explained role and purpose of OT as well as structure of CIR. See above and below for eval details.  Retrieved cushion and elevating leg rest for pt at beginning of session prior to mobility. Sit to stand from recliner Min/Mod before walking to wheelchair Min A with RW. Set up at sink  level and performed UB bathing Supervision, LB bathing at sit to stand level with Min A, pt able to wash periarea and buttocks in standing, assist for LLE past knee level. UB dress Supervision, LB dress Mod A and pt would benefit from reacher and LHS for ADLs. Note that pt did seem anxious throughout session, needing rest breaks and cues for pacing and problem solving. Pt is also hyperverbal and needs cues to  redirect. KI remained on throughout session. Pt set up in recliner with alarm on, call bell in reach, wife in room.   Skilled Therapeutic Intervention ADL ADL Grooming: Setup Upper Body Bathing: Supervision/safety Where Assessed-Upper Body Bathing: Sitting at sink Lower Body Bathing: Minimal assistance Where Assessed-Lower Body Bathing: Standing at sink Upper Body Dressing: Supervision/safety Where Assessed-Upper Body Dressing: Sitting at sink Lower Body Dressing: Moderate assistance Mobility  Bed Mobility Bed Mobility: Rolling Right;Rolling Left;Supine to Sit;Sit to Supine;Sitting - Scoot to [employer] of Bed Rolling Right: Contact Guard/Touching assist Rolling Left: Contact Guard/Touching assist Supine to Sit: Minimal Assistance - Patient > 75% Sitting - Scoot to [employer] of Bed: Contact Guard/Touching assist Sit to Supine: Minimal Assistance - Patient > 75% Transfers Sit to Stand: Minimal Assistance - Patient > 75%   Discharge Criteria: Patient will be discharged from OT if patient refuses treatment 3 consecutive times without medical reason, if treatment goals not met, if there is a change in medical status, if patient makes no progress towards goals or if patient is discharged from hospital.  The above assessment, treatment plan, treatment alternatives and goals were discussed and mutually agreed upon: by patient  [name] 10/11/2020, 11:22 AM

## 2020-10-12 ENCOUNTER — Inpatient Hospital Stay (HOSPITAL_COMMUNITY): Payer: Medicare HMO | Admitting: Occupational Therapy

## 2020-10-12 ENCOUNTER — Inpatient Hospital Stay (HOSPITAL_COMMUNITY): Payer: Medicare HMO

## 2020-10-12 DIAGNOSIS — E871 Hypo-osmolality and hyponatremia: Secondary | ICD-10-CM

## 2020-10-12 DIAGNOSIS — D72829 Elevated white blood cell count, unspecified: Secondary | ICD-10-CM

## 2020-10-12 DIAGNOSIS — I951 Orthostatic hypotension: Secondary | ICD-10-CM | POA: Diagnosis not present

## 2020-10-12 DIAGNOSIS — Z96651 Presence of right artificial knee joint: Secondary | ICD-10-CM | POA: Diagnosis not present

## 2020-10-12 DIAGNOSIS — Z96659 Presence of unspecified artificial knee joint: Secondary | ICD-10-CM

## 2020-10-12 DIAGNOSIS — G8918 Other acute postprocedural pain: Secondary | ICD-10-CM | POA: Diagnosis not present

## 2020-10-12 DIAGNOSIS — M1711 Unilateral primary osteoarthritis, right knee: Secondary | ICD-10-CM | POA: Diagnosis not present

## 2020-10-12 MED ORDER — RIVAROXABAN 15 MG PO TABS
15.0000 mg | ORAL_TABLET | Freq: Two times a day (BID) | ORAL | Status: DC
  Administered 2020-10-12 – 2020-10-22 (×20): 15 mg via ORAL
  Filled 2020-10-12 (×20): qty 1

## 2020-10-12 MED ORDER — RIVAROXABAN 20 MG PO TABS: 20.0000 mg | ORAL_TABLET | Freq: Every day | ORAL | Status: DC

## 2020-10-12 MED ORDER — POLYETHYLENE GLYCOL 3350 17 G PO PACK
17.0000 g | PACK | Freq: Two times a day (BID) | ORAL | Status: DC
  Administered 2020-10-12 – 2020-10-22 (×18): 17 g via ORAL
  Filled 2020-10-12 (×19): qty 1

## 2020-10-12 MED ORDER — SENNOSIDES-DOCUSATE SODIUM 8.6-50 MG PO TABS
2.0000 | ORAL_TABLET | Freq: Two times a day (BID) | ORAL | Status: DC
  Administered 2020-10-12 – 2020-10-14 (×4): 2 via ORAL
  Filled 2020-10-12 (×4): qty 2

## 2020-10-12 NOTE — Progress Notes (Addendum)
Physical Therapy Session Note  Patient Details  Name: Justin Rose MRN: [id number] Date of Birth: [birth year]  Today's Date: 10/12/2020 PT Individual Time:1000  - 1105, 65 min; 1418-1448, 30 min     Short Term Goals: Week 1:  PT Short Term Goal 1 (Week 1): pt to demonstrate supine<>sit CGA PT Short Term Goal 2 (Week 1): pt to demonstrate functional transfers CGA with LRAD PT Short Term Goal 3 (Week 1): pt to demonstrate gait training with LRAD for 100' CGA PT Short Term Goal 4 (Week 1): pt to improve ROM to R knee flexion to 60 degrees  Skilled Therapeutic Interventions/Progress Updates:  tx 1:  Pt resting in recliner.  He was very frustrated because of constipation and awaiting Doppler RLE.  PT clarified with Tomeka, RN that pt is allowed to do therapy including gait and removing R KI.  PT donned TEDs and adjusted medial and lateral metal uprights on KI for better positioning on soft tissues rather than bony areas on LL.   With KI doffed, sitting reclined, Therapeutic exercise performed with LE to increase strength for functional mobility.15 x 1 L straight leg raises, bil hip internal rotation, L ankle pumps, L quad sets, bil hip abduction/adduction with minimal resistance, and bil extended knees.  PT donned KI.  Sit>< stand iwht min assist.  Gait training on level tile with RW, x 100' including turns, CGA with cues for step lengths, decreased hard gripping of RW due to OA, upright trunk and forward gaze.  At end of session, pt seated in recliner iwht seat pad alarm set and needs at hand.  tx 2:  Pt resting in recliner.  He denied pain. Tameka , LPN stated that pt is still awaiting Dopper study.  He was already wearing RKI.   Sit> stand to RW iwht min assist.  Stand pivot to wc with CGA.  wc propulsion to/from therapy room for endurance, 100' with supervision.  Cues for turns and efficiency.  PT adjusted R ELR for fit and function.  Therapeutic exercises performed with LE to increase  strength for functional mobility.Standing: with LLE on 4" high step, 15 x 2 each: R hip abduction, R hip internal rotation, R hip extension.  Seated using 4# weighted bar, 15 x 2 scapular retraction (rowing).  Stand pivot wc to recliner with CGA, using RW.  At end of session, pt reclined in recliner with bil LEs elevated, with seat pad alarm set and needs at hand.       Therapy Documentation Precautions:  Precautions Precautions: Fall Precaution Comments: Right KI Required Braces or Orthoses: Knee Immobilizer - Right Restrictions Weight Bearing Restrictions: No RLE Weight Bearing: Weight bearing as tolerated   Pain: Pain Assessment Pain Scale: 0-10 Pain Score: 0-No pain  AM and PM sessions      Therapy/Group: Individual Therapy  Rosslyn Pasion 10/12/2020, 10:10 AM

## 2020-10-12 NOTE — Progress Notes (Signed)
Patient received doppler. Patient presents positive for DVT to right leg. MD Posey Pronto notified. New order to D/C SCD and CPM order and hold Lovenox for time being. MD to call with further instructions. Sanda Linger, LPN

## 2020-10-12 NOTE — Progress Notes (Signed)
Lower extremity venous bilateral study completed.  Preliminary results relayed to RN.   See CV Proc for preliminary results report.   Logon Uttech, RDMS  

## 2020-10-12 NOTE — Plan of Care (Signed)
°  Problem: Consults Goal: RH STROKE PATIENT EDUCATION Description: See Patient Education module for education specifics  Outcome: Progressing   Problem: RH SKIN INTEGRITY Goal: RH STG SKIN FREE OF INFECTION/BREAKDOWN Description: Assess skin q shift and as needed Outcome: Progressing   Problem: RH SAFETY Goal: RH STG ADHERE TO SAFETY PRECAUTIONS W/ASSISTANCE/DEVICE Description: STG Adhere to Safety Precautions With Mod I Assistance/Device. Outcome: Progressing Goal: RH STG DECREASED RISK OF FALL WITH ASSISTANCE Description: STG Decreased Risk of Fall With Mod I Assistance. Outcome: Progressing

## 2020-10-12 NOTE — Progress Notes (Signed)
ANTICOAGULATION CONSULT NOTE - Initial Consult  Pharmacy Consult for xarelto Indication: DVT  Allergies  Allergen Reactions  . Lipitor [Atorvastatin]     Myositis     Patient Measurements: Height: 5\' 11"  (180.3 cm) Weight: 81.3 kg (179 lb 3.7 oz) IBW/kg (Calculated) : 75.3  Vital Signs: Temp: 98.4 F (36.9 C) (11/27 1519) BP: 112/79 (11/27 1519) Pulse Rate: 85 (11/27 1519)  Labs: Recent Labs    10/10/20 0415  HGB 9.8*  HCT 30.6*  PLT 405*  CREATININE 0.74    Estimated Creatinine Clearance: 73.2 mL/min (by C-G formula based on SCr of 0.74 mg/dL).   Medical History: Past Medical History:  Diagnosis Date  . Arthritis   . Basal cell carcinoma (BCC) of head   . Basal cell carcinoma, ear    Left ear  . Glaucoma    Left eye   Assessment: 60 yom s/p recent TKA and CVA found to have an acute DVT. To start xarelto. Baseline Hgb is low at 9.8 as of 11/25 and platelets are elevated. He was not on anticoagulation PTA.   Goal of Therapy:  Therapeutic anticoagulation Monitor platelets by anticoagulation protocol: Yes   Plan:  Xarelto 15mg  PO BID x 21 days then 20mg  daily CBC QMon as ordered Monitor for bleeding  Sostenes Kauffmann, Rande Lawman 10/12/2020,5:28 PM

## 2020-10-12 NOTE — Progress Notes (Addendum)
Justin Rose PHYSICAL MEDICINE & REHABILITATION PROGRESS NOTE   Subjective/Complaints: Patient seen standing with therapy this morning.  He states he slept well overnight.  He has significant concerns regarding constipation.  He states he had a good first day of therapies yesterday.  He notes inconsistent bladder function, but able to void.  ROS: + Constipation.  Denies CP, SOB, N/V/D  Objective:   No results found. Recent Labs    10/10/20 0415  WBC 12.4*  HGB 9.8*  HCT 30.6*  PLT 405*   Recent Labs    10/10/20 0415  NA 134*  K 3.5  CL 100  CO2 23  GLUCOSE 137*  BUN 13  CREATININE 0.74  CALCIUM 8.7*    Intake/Output Summary (Last 24 hours) at 10/12/2020 0911 Last data filed at 10/12/2020 9242 Gross per 24 hour  Intake 520 ml  Output 1325 ml  Net -805 ml        Physical Exam: Vital Signs Blood pressure 119/76, pulse 73, temperature 98 F (36.7 C), temperature source Oral, resp. rate 18, height 5\' 11"  (1.803 m), weight 85.5 kg, SpO2 97 %. Constitutional: No distress . Vital signs reviewed. HENT: Normocephalic.  Atraumatic. Eyes: EOMI. No discharge. Cardiovascular: No JVD.  RRR. Respiratory: Normal effort.  No stridor.  Bilateral clear to auscultation. GI: Non-distended.  BS slowed.. Skin: Warm and dry.  Right knee with dressing CDI Psych: Normal mood.  Normal behavior. 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 Functional deficits which require 3+ hours per day of interdisciplinary therapy in a comprehensive inpatient rehab setting.  Physiatrist is providing close team supervision and 24 hour management of active medical problems listed below.  Physiatrist and rehab team continue to assess barriers  to discharge/monitor patient progress toward functional and medical goals  Care Tool:  Bathing    Body parts bathed by patient: Right arm, Left arm, Chest, Abdomen, Front perineal area, Buttocks, Right upper leg, Left upper leg, Face   Body parts bathed by helper: Left lower leg Body parts n/a: Right lower leg (in KI in standing, did not wash)   Bathing assist Assist Level: Minimal Assistance - Patient > 75%     Upper Body Dressing/Undressing Upper body dressing   What is the patient wearing?: Pull over shirt    Upper body assist Assist Level: Supervision/Verbal cueing    Lower Body Dressing/Undressing Lower body dressing      What is the patient wearing?: Incontinence brief, Pants     Lower body assist Assist for lower body dressing: Moderate Assistance - Patient 50 - 74%     Toileting Toileting    Toileting assist Assist for toileting: Minimal Assistance - Patient > 75% (attempted but no BM, assist only with brief)     Transfers Chair/bed transfer  Transfers assist     Chair/bed transfer assist level: Minimal Assistance - Patient > 75%     Locomotion Ambulation   Ambulation assist      Assist level: Minimal Assistance - Patient > 75% Assistive device: Walker-rolling Max distance: 50   Walk 10 feet activity   Assist     Assist level: Minimal Assistance - Patient > 75%     Walk 50 feet activity   Assist  Assist level: Minimal Assistance - Patient > 75% Assistive device: Walker-rolling    Walk 150 feet activity   Assist Walk 150 feet activity did not occur: Safety/medical concerns         Walk 10 feet on uneven surface  activity   Assist Walk 10 feet on uneven surfaces activity did not occur: Safety/medical concerns         Wheelchair     Assist Will patient use wheelchair at discharge?: No      Wheelchair assist level: Minimal Assistance - Patient > 75% Max wheelchair distance: 130    Wheelchair 50 feet with  2 turns activity    Assist        Assist Level: Minimal Assistance - Patient > 75%   Wheelchair 150 feet activity     Assist  Wheelchair 150 feet activity did not occur: Safety/medical concerns       Blood pressure 119/76, pulse 73, temperature 98 F (36.7 C), temperature source Oral, resp. rate 18, height 5\' 11"  (1.803 m), weight 85.5 kg, SpO2 97 %.  Medical Problem List and Plan: 1.  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  Continue CIR 2.  Antithrombotics: -DVT/anticoagulation:  Mechanical: Sequential compression devices, below knee Bilateral lower extremities.   Venous Dopplers pending             -antiplatelet therapy: ASA/Plavix 3. Pain Management: Oxycodone, tramadol and/or robaxin prn.   Controlled with meds on 11/27  Monitor with increased exertion 4. Mood: LCSW to follow for evaluation and support.              -antipsychotic agents: N/A 5. Neuropsych: This patient is capable of making decisions on his own behalf. 6. Skin/Wound Care: Monitor incision for healing.  Added protein supplement to help promote wound healing.  7. Fluids/Electrolytes/Nutrition: Monitor I/Os.  8. Dizziness: Monitor orthostatic vitals. Encourage fluid intake.  Orthostatics positive on 11/27  Compression stockings/abdominal binder as necessary 9. ABLA:              Stool guaiacs pending             Iron supplement added  Hemoglobin 9.8 on 11/25, labs ordered for Monday  Continue to monitor 10. Statin induced myositis:   LFTs elevated on 11/25, labs ordered for Monday 11. Drug induced constipation:   Bowel meds resumed. 12. Proteus UTI:  IV Rocephin X 3 days-->Keflex thorough 11/27 per recs.  Repeat UA normal  Reviewed urine culture with multiple species 13. Urinary retention: Continue Flomax.    Overall improving 14.  Hyponatremia  Sodium 134 on 11/25, labs ordered for Monday 15.   Leukocytosis  WBCs 12.4 on 11/25, labs ordered for Monday  See #12  LOS: 3 days A FACE TO FACE EVALUATION WAS PERFORMED  Boy Delamater Lorie Phenix 10/12/2020, 9:11 AM

## 2020-10-12 NOTE — Progress Notes (Signed)
Occupational Therapy Session Note  Patient Details  Name: Justin Rose MRN: [id number] Date of Birth: 12/25/1933  Today's Date: 10/12/2020 OT Individual Time: 2500-3704 and 1255-1340 OT Individual Time Calculation (min): 55 min and 45 min   Short Term Goals: Week 1:  OT Short Term Goal 1 (Week 1): STGs = LTGs d/t ELOS at Supervision level  Skilled Therapeutic Interventions/Progress Updates:    Pt greeted at time of session semi reclined in bed stating "I am not doing good," and when asked why, pt stating he had not had a BM and is stressed/anxious about this. Agreeable to attempt toileting. Supine to sit EOB CGA/Min for RLE management, sit to stand Min A from bed surface before walking to bathroom CGA with RW and transferred to toilet with BSC over toilet in same manner. Pt able to doff pants with CGA, assist with brief donning/doffing as he did not understand how to remove straps. Extended time on toilet with pt passing gas only, no BM. Note extended time for all tasks as pt is hyperverbal and wanted to express concerns over lack of BM and constipation, encouraged to speak with MD regarding medicines. MD entered during session and pt able to ask about bowel meds, planning on changing. Ambulated to sink level CGA with RW and performed oral hygiene in standing before walking in same manner to recliner. Set up with alarm on, call bell in reach, assisted with calling cafeteria for pt to request coffee.   Session 2: Pt greeted at time of session sitting up in recliner agreeable to OT session, no c/o pain. Pt wanting to try to use bathroom again d/t not having BM and receiving bowel aide meds earlier today. Sit to stand Min/CGA from recliner before walking to bathroom with RW with CGA and initially wanting to urinate only in standing, but changing his mind and wanted to attempt BM, sat on BSC over toilet with CGA. No BM, but pt did pass gas and voided urine. CGA for clothing management except assist with  brief as straps confuse him. Walked back to recliner CGA with RW before performing BUE there ex with 5# dowel for bicep curls, chest press, overhead press for sets of 20 until fatigued. Pt up in chair with alarm on, call bell inr each.   Therapy Documentation Precautions:  Precautions Precautions: Fall Precaution Comments: Right KI Required Braces or Orthoses: Knee Immobilizer - Right Restrictions Weight Bearing Restrictions: No RLE Weight Bearing: Weight bearing as tolerated    Therapy/Group: Individual Therapy  [name] 10/12/2020, 8:01 AM

## 2020-10-13 ENCOUNTER — Inpatient Hospital Stay (HOSPITAL_COMMUNITY): Payer: Medicare HMO

## 2020-10-13 ENCOUNTER — Inpatient Hospital Stay (HOSPITAL_COMMUNITY): Payer: Medicare HMO | Admitting: Physical Therapy

## 2020-10-13 DIAGNOSIS — I951 Orthostatic hypotension: Secondary | ICD-10-CM | POA: Diagnosis not present

## 2020-10-13 DIAGNOSIS — E871 Hypo-osmolality and hyponatremia: Secondary | ICD-10-CM | POA: Diagnosis not present

## 2020-10-13 DIAGNOSIS — I82441 Acute embolism and thrombosis of right tibial vein: Secondary | ICD-10-CM

## 2020-10-13 DIAGNOSIS — D72829 Elevated white blood cell count, unspecified: Secondary | ICD-10-CM | POA: Diagnosis not present

## 2020-10-13 DIAGNOSIS — M1711 Unilateral primary osteoarthritis, right knee: Secondary | ICD-10-CM | POA: Diagnosis not present

## 2020-10-13 LAB — OCCULT BLOOD X 1 CARD TO LAB, STOOL: Fecal Occult Bld: POSITIVE — AB

## 2020-10-13 MED ORDER — DIPHENHYDRAMINE HCL 25 MG PO CAPS
25.0000 mg | ORAL_CAPSULE | Freq: Every evening | ORAL | Status: DC | PRN
  Administered 2020-10-13 – 2020-10-21 (×7): 25 mg via ORAL
  Filled 2020-10-13 (×7): qty 1

## 2020-10-13 NOTE — Progress Notes (Signed)
Physical Therapy Session Note  Patient Details  Name: Justin Rose MRN: [id number] Date of Birth: 1934-08-12  Today's Date: 10/13/2020 PT Individual Time: 3546-5681; 2751-7001 PT Individual Time Calculation (min): 30 min and 15 min PT Amount of Missed Time (min): 60 Minutes PT Missed Treatment Reason: Other (Comment) (limited due to mobility restrictions with RLE)  Short Term Goals: Week 1:  PT Short Term Goal 1 (Week 1): pt to demonstrate supine<>sit CGA PT Short Term Goal 2 (Week 1): pt to demonstrate functional transfers CGA with LRAD PT Short Term Goal 3 (Week 1): pt to demonstrate gait training with LRAD for 100' CGA PT Short Term Goal 4 (Week 1): pt to improve ROM to R knee flexion to 60 degrees  Skilled Therapeutic Interventions/Progress Updates:    Session 1: Pt received seated in recliner in room, agreeable to PT session. No complaints of pain. After discussion with nursing regarding restrictions due to (+) DVT in RLE pt not able to use RLE this date (ie no transfers and no RLE strengthening or ROM). Assisted pt with repositioning KI on RLE due to metal bracing pushing into posterior aspect of thigh and placed washcloth inside brace for improved cushioning. Provided pt with updated schedule for Sunday and Monday as provided schedule was inaccurate. Education with patient regarding mobility restrictions this date, pt with good understanding of restrictions. Pt missed 30 min of scheduled therapy session due to mobility restrictions.  Session 2: Pt received seated in recliner in room. No complaints of pain. Pt reports he has no discomfort with use of KI this date after adjustments during AM session. Pt requesting to urinate. Sit to stand with CGA to RW. Pt able to use urinal while standing with CGA for balance. Pt then returned to sitting in recliner. Pt unable to further functionally participate in therapy session due to RLE mobility restrictions this date. Pt left seated in recliner in  room with needs in reach. Pt missed 30 min of scheduled therapy session due to mobility restrictions.  Therapy Documentation Precautions:  Precautions Precautions: Fall Precaution Comments: Right KI Required Braces or Orthoses: Knee Immobilizer - Right Restrictions Weight Bearing Restrictions: No RLE Weight Bearing: Weight bearing as tolerated   Therapy/Group: Individual Therapy   Excell Seltzer, PT, DPT  10/13/2020, 11:00 AM

## 2020-10-13 NOTE — Progress Notes (Signed)
Occupational Therapy Session Note  Patient Details  Name: Justin Rose MRN: [id number] Date of Birth: [birth year]  Today's Date: 10/13/2020 OT Individual Time: 1300-1330 OT Individual Time Calculation (min): 30 min  and Today's Date: 10/13/2020 OT Missed Time: 30 Minutes Missed Time Reason: Other (comment) (DVT precautions per RN report)   Short Term Goals: Week 1:  OT Short Term Goal 1 (Week 1): STGs = LTGs d/t ELOS at Supervision level  Skilled Therapeutic Interventions/Progress Updates:     Pt resting in recliner upon arrival with daughter present. Pt still on DVT precautions (per RN). BUE therex seated in recliner. 5# barbell-curls, punches, overhead presses, shoulder abduction 8x3. BUE theraband eercises overhead presses, curls, punches 8x3. Pt remained in recliner with daughter present. All needs within reach. Daughter present.   Therapy Documentation Precautions:  Precautions Precautions: Fall Precaution Comments: Right KI Required Braces or Orthoses: Knee Immobilizer - Right Restrictions Weight Bearing Restrictions: No RLE Weight Bearing: Weight bearing as tolerated General: General OT Amount of Missed Time: 30 Minutes Pain:  Pt denies pain this afternoon   Therapy/Group: Individual Therapy  Leroy Libman 10/13/2020, 1:41 PM

## 2020-10-13 NOTE — Progress Notes (Signed)
Fecal occult blood test positive. Dr. Posey Pronto informed.

## 2020-10-13 NOTE — Progress Notes (Signed)
Occupational Therapy Session Note  Patient Details  Name: Justin Rose MRN: [id number] Date of Birth: 1934/02/28  Today's Date: 10/13/2020 OT Individual Time: 3295-1884 OT Individual Time Calculation (min): 30 min    Short Term Goals: Week 1:  OT Short Term Goal 1 (Week 1): STGs = LTGs d/t ELOS at Supervision level  Skilled Therapeutic Interventions/Progress Updates:    Per RN report, pt with RLE DVT. Per RN, pt to keep RLE immobilized as much as possible. Pt resting in recliner upon arrival.  Pt requesting to use toilet for BM (hopefully). Sit<>stand and functional amb with RW with CGA. Min A for toileting and CGA for transfers. Pt with no BM and returned to recliner. BUE therex with orange theraband-diagonals 10x2. Discussed with pt that additional exercises will be discussed durng afternoon session. Pt remained in recliner with all needs within reach.   Therapy Documentation Precautions:  Precautions Precautions: Fall Precaution Comments: Right KI Required Braces or Orthoses: Knee Immobilizer - Right Restrictions Weight Bearing Restrictions: No RLE Weight Bearing: Weight bearing as tolerated Pain: Pain Assessment Pain Scale: 0-10 Pain Score: 0-No pain   Therapy/Group: Individual Therapy  Leroy Libman 10/13/2020, 12:19 PM

## 2020-10-13 NOTE — Plan of Care (Signed)
  Problem: Consults Goal: RH STROKE PATIENT EDUCATION Description: See Patient Education module for education specifics  Outcome: Progressing   Problem: RH SKIN INTEGRITY Goal: RH STG SKIN FREE OF INFECTION/BREAKDOWN Description: Assess skin q shift and as needed Outcome: Progressing   Problem: RH SAFETY Goal: RH STG ADHERE TO SAFETY PRECAUTIONS W/ASSISTANCE/DEVICE Description: STG Adhere to Safety Precautions With Mod I Assistance/Device. Outcome: Progressing Goal: RH STG DECREASED RISK OF FALL WITH ASSISTANCE Description: STG Decreased Risk of Fall With Mod I Assistance. Outcome: Progressing

## 2020-10-13 NOTE — Progress Notes (Signed)
First occult sample taken and sent to the lab. Patient needs two more occults done. Sanda Linger, LPN

## 2020-10-13 NOTE — Progress Notes (Addendum)
Justin Rose PHYSICAL MEDICINE & REHABILITATION PROGRESS NOTE   Subjective/Complaints: Patient seen standing this morning.  He states he slept well overnight.  He has questions regarding blood clot, lysine, Benadryl.  He states that he takes lysine which is an herbal supplement to prevent cold sores daily.  Encourage patient to have wife bring in supplement to assess.  He also states that he takes Benadryl for sleep every night PTA.  He states he feels he needs to have a bowel movement soon.  ROS: Denies CP, SOB, N/V/D  Objective:   VAS [place] LOWER EXTREMITY VENOUS (DVT)  Result Date: 10/12/2020  Lower Venous DVT Study Indications: Immobility, s/p knee surgery.  Risk Factors: Surgery 09-30-2020 RT total knee arthroplasty. Comparison       10-02-2020 Bilateral lower extremity venous was negative for Study:           DVT. Performing Technologist: Darlin Coco, RDMS  Examination Guidelines: A complete evaluation includes B-mode imaging, spectral Doppler, color Doppler, and power Doppler as needed of all accessible portions of each vessel. Bilateral testing is considered an integral part of a complete examination. Limited examinations for reoccurring indications may be performed as noted. The reflux portion of the exam is performed with the patient in reverse Trendelenburg.  +---------+---------------+---------+-----------+----------+--------------+ RIGHT    CompressibilityPhasicitySpontaneityPropertiesThrombus Aging +---------+---------------+---------+-----------+----------+--------------+ CFV      Full           Yes      Yes                                 +---------+---------------+---------+-----------+----------+--------------+ SFJ      Partial        Yes      Yes                  Acute          +---------+---------------+---------+-----------+----------+--------------+ FV Prox  Full                                                         +---------+---------------+---------+-----------+----------+--------------+ FV Mid   Full                                                        +---------+---------------+---------+-----------+----------+--------------+ FV DistalFull                                                        +---------+---------------+---------+-----------+----------+--------------+ PFV      Full                                                        +---------+---------------+---------+-----------+----------+--------------+ POP      Full           Yes      Yes                                 +---------+---------------+---------+-----------+----------+--------------+  PTV      Partial        Yes      Yes                  Acute          +---------+---------------+---------+-----------+----------+--------------+ PERO     Partial        Yes      Yes                  Acute          +---------+---------------+---------+-----------+----------+--------------+   +---------+---------------+---------+-----------+----------+--------------+ LEFT     CompressibilityPhasicitySpontaneityPropertiesThrombus Aging +---------+---------------+---------+-----------+----------+--------------+ CFV      Full           Yes      Yes                                 +---------+---------------+---------+-----------+----------+--------------+ SFJ      Full                                                        +---------+---------------+---------+-----------+----------+--------------+ FV Prox  Full                                                        +---------+---------------+---------+-----------+----------+--------------+ FV Mid   Full                                                        +---------+---------------+---------+-----------+----------+--------------+ FV DistalFull                                                         +---------+---------------+---------+-----------+----------+--------------+ PFV      Full                                                        +---------+---------------+---------+-----------+----------+--------------+ POP      Full           Yes      Yes                                 +---------+---------------+---------+-----------+----------+--------------+ PTV      Full                                                        +---------+---------------+---------+-----------+----------+--------------+ PERO     Full                                                        +---------+---------------+---------+-----------+----------+--------------+  Summary: RIGHT: - Findings consistent with acute deep vein thrombosis involving the SF junction, right posterior tibial veins, and right peroneal veins. - No cystic structure found in the popliteal fossa.  LEFT: - There is no evidence of deep vein thrombosis in the lower extremity.  - No cystic structure found in the popliteal fossa.  *See table(s) above for measurements and observations.    Preliminary    No results for input(s): WBC, HGB, HCT, PLT in the last 72 hours. No results for input(s): NA, K, CL, CO2, GLUCOSE, BUN, CREATININE, CALCIUM in the last 72 hours.  Intake/Output Summary (Last 24 hours) at 10/13/2020 0820 Last data filed at 10/13/2020 0750 Gross per 24 hour  Intake 880 ml  Output 1500 ml  Net -620 ml        Physical Exam: Vital Signs Blood pressure 110/74, pulse 91, temperature 98.1 F (36.7 C), temperature source Oral, resp. rate 20, height 5\' 11"  (1.803 m), weight 81.3 kg, SpO2 96 %. Constitutional: No distress . Vital signs reviewed. HENT: Normocephalic.  Atraumatic. Eyes: EOMI. No discharge. Cardiovascular: No JVD.  RRR. Respiratory: Normal effort.  No stridor.  Bilateral clear to auscultation. GI: Non-distended.  BS +. Skin: Warm and dry.  Right knee with dressing CDI. Psych: Normal mood.   Normal behavior. 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 Functional deficits which require 3+ hours per day of interdisciplinary therapy in a comprehensive inpatient rehab setting.  Physiatrist is providing close team supervision and 24 hour management of active medical problems listed below.  Physiatrist and rehab team continue to assess barriers to discharge/monitor patient progress toward functional and medical goals  Care Tool:  Bathing    Body parts bathed by patient: Right arm, Left arm, Chest, Abdomen, Front perineal area, Buttocks, Right upper leg, Left upper leg, Face   Body parts bathed by helper: Left lower leg Body parts n/a: Right lower leg (in KI in standing, did not wash)   Bathing assist Assist Level: Minimal Assistance - Patient > 75%     Upper Body Dressing/Undressing Upper body dressing   What is the patient wearing?: Pull over shirt    Upper body assist Assist Level: Supervision/Verbal cueing    Lower Body Dressing/Undressing Lower body dressing      What is the patient wearing?: Incontinence brief, Pants     Lower body assist Assist for lower body dressing: Moderate Assistance - Patient 50 - 74%     Toileting Toileting    Toileting assist Assist for toileting: Minimal Assistance - Patient > 75% (attempted but no BM, assist only with brief)     Transfers Chair/bed transfer  Transfers assist     Chair/bed transfer assist level: Minimal Assistance - Patient > 75%     Locomotion Ambulation   Ambulation assist      Assist level: Minimal Assistance - Patient > 75% Assistive device: Walker-rolling Max distance: 50   Walk 10 feet activity   Assist     Assist level: Minimal  Assistance - Patient > 75%     Walk 50 feet activity   Assist    Assist level: Minimal Assistance - Patient > 75% Assistive device: Walker-rolling    Walk 150 feet activity  Assist Walk 150 feet activity did not occur: Safety/medical concerns         Walk 10 feet on uneven surface  activity   Assist Walk 10 feet on uneven surfaces activity did not occur: Safety/medical concerns         Wheelchair     Assist Will patient use wheelchair at discharge?: No      Wheelchair assist level: Minimal Assistance - Patient > 75% Max wheelchair distance: 130    Wheelchair 50 feet with 2 turns activity    Assist        Assist Level: Minimal Assistance - Patient > 75%   Wheelchair 150 feet activity     Assist  Wheelchair 150 feet activity did not occur: Safety/medical concerns       Blood pressure 110/74, pulse 91, temperature 98.1 F (36.7 C), temperature source Oral, resp. rate 20, height 5\' 11"  (1.803 m), weight 81.3 kg, SpO2 96 %.  Medical Problem List and Plan: 1.  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  Continue CIR  DC CPM-discussed with patient and nursing. 2.  Antithrombotics: -DVT/anticoagulation: Xarelto ordered.   Venous Dopplers showing right saphenous vein junction, posterior tib, peroneal vein DVT             -antiplatelet therapy: ASA/Plavix DC'd, discussed with neurology, given DOAC. 3. Pain Management: Oxycodone, tramadol and/or robaxin prn.   Controlled with meds on 11/28  Monitor with increased exertion 4. Mood: LCSW to follow for evaluation and support.              -antipsychotic agents: N/A 5. Neuropsych: This patient is capable of making decisions on his own behalf. 6. Skin/Wound Care: Monitor incision for healing.  Added protein supplement to help promote wound healing.  7. Fluids/Electrolytes/Nutrition: Monitor I/Os.  8. Dizziness: Monitor  orthostatic vitals. Encourage fluid intake.  Orthostatics borderline positive on 11/28  Compression stockings/abdominal binder as necessary 9. 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 Drug induced constipation:   Bowel meds resumed.  Will consider further increase tomorrow if no bowel movement today. 12. Proteus UTI:  IV Rocephin X 3 days-->Keflex course completed on 11/27.  Repeat UA normal  Reviewed urine culture with multiple species 13. Urinary retention: Continue Flomax.    Overall improving 14.  Hyponatremia  Sodium 134 on 11/25, labs ordered for tomorrow 15.  Leukocytosis  WBCs 12.4 on 11/25, labs ordered for tomorrow  See #12 16.?  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  Lysine at home daily.  Encouraged patient to ask his wife to bring for evaluation 17.  Sleep disturbance  Benadryl 25 ordered per patient request as this is his medication PTA.  LOS: 4 days A FACE TO FACE EVALUATION WAS PERFORMED  Ashutosh Dieguez Lorie Phenix 10/13/2020, 8:20 AM

## 2020-10-14 ENCOUNTER — Inpatient Hospital Stay (HOSPITAL_COMMUNITY): Payer: Medicare HMO

## 2020-10-14 ENCOUNTER — Inpatient Hospital Stay (HOSPITAL_COMMUNITY): Payer: Medicare HMO | Admitting: Occupational Therapy

## 2020-10-14 DIAGNOSIS — I6340 Cerebral infarction due to embolism of unspecified cerebral artery: Secondary | ICD-10-CM | POA: Diagnosis not present

## 2020-10-14 LAB — COMPREHENSIVE METABOLIC PANEL
ALT: 61 U/L — ABNORMAL HIGH (ref 0–44)
AST: 41 U/L (ref 15–41)
Albumin: 2.4 g/dL — ABNORMAL LOW (ref 3.5–5.0)
Alkaline Phosphatase: 74 U/L (ref 38–126)
Anion gap: 8 (ref 5–15)
BUN: 15 mg/dL (ref 8–23)
CO2: 26 mmol/L (ref 22–32)
Calcium: 8.7 mg/dL — ABNORMAL LOW (ref 8.9–10.3)
Chloride: 103 mmol/L (ref 98–111)
Creatinine, Ser: 0.79 mg/dL (ref 0.61–1.24)
GFR, Estimated: >60 mL/min (ref >60)
Glucose, Bld: 103 mg/dL — ABNORMAL HIGH (ref 70–99)
Potassium: 3.8 mmol/L (ref 3.5–5.1)
Sodium: 137 mmol/L (ref 135–145)
Total Bilirubin: 1 mg/dL (ref 0.3–1.2)
Total Protein: 5.4 g/dL — ABNORMAL LOW (ref 6.5–8.1)

## 2020-10-14 LAB — CBC
HCT: 29 % — ABNORMAL LOW (ref 39.0–52.0)
Hemoglobin: 9.3 g/dL — ABNORMAL LOW (ref 13.0–17.0)
MCH: 30.6 pg (ref 26.0–34.0)
MCHC: 32.1 g/dL (ref 30.0–36.0)
MCV: 95.4 fL (ref 80.0–100.0)
Platelets: 410 10*3/uL — ABNORMAL HIGH (ref 150–400)
RBC: 3.04 MIL/uL — ABNORMAL LOW (ref 4.22–5.81)
RDW: 13.6 % (ref 11.5–15.5)
WBC: 8.6 10*3/uL (ref 4.0–10.5)
nRBC: 0 % (ref 0.0–0.2)

## 2020-10-14 LAB — OCCULT BLOOD X 1 CARD TO LAB, STOOL: Fecal Occult Bld: POSITIVE — AB

## 2020-10-14 MED ORDER — SENNOSIDES-DOCUSATE SODIUM 8.6-50 MG PO TABS
1.0000 | ORAL_TABLET | Freq: Every day | ORAL | Status: DC
  Administered 2020-10-15 – 2020-10-16 (×2): 1 via ORAL
  Filled 2020-10-14 (×2): qty 1

## 2020-10-14 NOTE — Discharge Instructions (Addendum)
Inpatient Rehab Discharge Instructions  Justin Rose Discharge date and time: 10/22/20   Activities/Precautions/ Functional Status: Activity: no lifting, driving, or strenuous exercise for till cleared by MD Diet: regular diet Wound Care: Wash with soap and water. Pat dry. Contact Dr. Wynelle Link if you develop any problems with your incision/wound--redness, swelling, increase in pain, drainage or if you develop fever or chills.    Functional status:  ___ No restrictions     ___ Walk up steps independently ___ 24/7 supervision/assistance   ___ Walk up steps with assistance _X__ Intermittent supervision/assistance  ___ Bathe/dress independently ___ Walk with walker     ___ Bathe/dress with assistance ___ Walk Independently    ___ Shower independently ___ Walk with assistance    _X__ Shower with assistance _X__ No alcohol     ___ Return to work/school ________  Special Instructions:    COMMUNITY REFERRALS UPON DISCHARGE:    Outpatient: PT                   Agency: [facility]  Phone: [phone]              Appointment Date/Time: Facility To Determine at Discharge  Medical Equipment/Items Ordered: Bedside Commode                                                 Agency/Supplier: [employer]   My questions have been answered and I understand these instructions. I will adhere to these goals and the provided educational materials after my discharge from the hospital.  Patient/Caregiver Signature _______________________________ Date __________  Clinician Signature _______________________________________ Date __________  Please bring this form and your medication list with you to all your follow-up doctor's appointments.   Information on my medicine - XARELTO (rivaroxaban)  This medication education was reviewed with me or my healthcare representative as part of my discharge preparation.   WHY WAS XARELTO PRESCRIBED FOR  YOU? Xarelto was prescribed to treat blood clots that may have been found in the veins of your legs (deep vein thrombosis) or in your lungs (pulmonary embolism) and to reduce the risk of them occurring again.  What do you need to know about Xarelto? The starting dose is one 15 mg tablet taken TWICE daily with food for the FIRST 21 DAYS then on (enter date)  12/19  the dose is changed to one 20 mg tablet taken ONCE A DAY with your evening meal.  DO NOT stop taking Xarelto without talking to the health care provider who prescribed the medication.  Refill your prescription for 20 mg tablets before you run out.  After discharge, you should have regular check-up appointments with your healthcare provider that is prescribing your Xarelto.  In the future your dose may need to be changed if your kidney function changes by a significant amount.  What do you do if you miss a dose? If you are taking Xarelto TWICE DAILY and you miss a dose, take it as soon as you remember. You may take two 15 mg tablets (total 30 mg) at the same time then resume your regularly scheduled 15 mg twice daily the next day.  If you are taking Xarelto ONCE DAILY and you miss a dose, take it as soon as you remember on the same day then continue your regularly scheduled once daily  regimen the next day. Do not take two doses of Xarelto at the same time.   Important Safety Information Xarelto is a blood thinner medicine that can cause bleeding. You should call your healthcare provider right away if you experience any of the following: ? Bleeding from an injury or your nose that does not stop. ? Unusual colored urine (red or dark brown) or unusual colored stools (red or black). ? Unusual bruising for unknown reasons. ? A serious fall or if you hit your head (even if there is no bleeding).  Some medicines may interact with Xarelto and might increase your risk of bleeding while on Xarelto. To help avoid this, consult your  healthcare provider or pharmacist prior to using any new prescription or non-prescription medications, including herbals, vitamins, non-steroidal anti-inflammatory drugs (NSAIDs) and supplements. ================================================  Deep Vein Thrombosis    Deep vein thrombosis (DVT) is a condition in which a blood clot forms in a deep vein, such as a lower leg, thigh, or arm vein. A clot is blood that has thickened into a gel or solid. This condition is dangerous. It can lead to serious and even life-threatening complications if the clot travels to the lungs and causes a blockage (pulmonary embolism). It can also damage veins in the leg. This can result in leg pain, swelling, discoloration, and sores (post-thrombotic syndrome).  What are the causes? This condition may be caused by:  A slowdown of blood flow.  Damage to a vein.  A condition that causes blood to clot more easily, such as an inherited clotting disorder.   What increases the risk? The following factors may make you more likely to develop this condition: 1. Being overweight. 2. Being older, especially over age 56. 45. Sitting or lying down for more than four hours. 4. Being in the hospital. 5. Lack of physical activity (sedentary lifestyle). 6. Pregnancy, being in childbirth, or having recently given birth. 7. Taking medicines that contain estrogen, such as medicines to prevent pregnancy. 8. Smoking. 9. A history of any of the following: ? Blood clots or a blood clotting disease. ? Peripheral vascular disease. ? Inflammatory bowel disease. ? Cancer. ? Heart disease. ? Genetic conditions that affect how your blood clots, such as Factor V Leiden mutation. ? Neurological diseases that affect your legs (leg paresis). ? A recent injury, such as a car accident. ? Major or lengthy surgery. ? A central line placed inside a large vein.  What are the signs or symptoms? Symptoms of this condition  include:  Swelling, pain, or tenderness in an arm or leg.  Warmth, redness, or discoloration in an arm or leg. If the clot is in your leg, symptoms may be more noticeable or worse when you stand or walk. Some people may not develop any symptoms.  How is this diagnosed? This condition is diagnosed with: 1. A medical history and physical exam. 2. Tests, such as: ? Blood tests. These are done to check how well your blood clots. ? Ultrasound. This is done to check for clots. ? Venogram. For this test, contrast dye is injected into a vein and X-rays are taken to check for any clots  How is this treated? Treatment for this condition depends on:  The cause of your DVT.  Your risk for bleeding or developing more clots.  Any other medical conditions that you have. Treatment may include: 1. Taking a blood thinner (anticoagulant). This type of medicine prevents clots from forming. It may be taken by mouth,  injected under the skin, or injected through an IV (catheter). 2. Injecting clot-dissolving medicines into the affected vein (catheter-directed thrombolysis). 3. Having surgery. Surgery may be done to: ? Remove the clot. ? Place a filter in a large vein to catch blood clots before they reach the lungs. Some treatments may be continued for up to six months.  Follow these instructions at home: If you are taking blood thinners: 1. Take the medicine exactly as told by your health care provider. Some blood thinners need to be taken at the same time every day. Do not skip a dose. 2. Talk with your health care provider before you take any medicines that contain aspirin or NSAIDs. These medicines increase your risk for dangerous bleeding. 3. Ask your health care provider about foods and drugs that could change the way the medicine works (may interact). Avoid those things if your health care provider tells you to do so. 4. Blood thinners can cause easy bruising and may make it difficult to stop  bleeding. Because of this: ? Be very careful when using knives, scissors, or other sharp objects. ? Use an electric razor instead of a blade. ? Avoid activities that could cause injury or bruising, and follow instructions about how to prevent falls. 5. Wear a medical alert bracelet or carry a card that lists what medicines you take.  General instructions  Take over-the-counter and prescription medicines only as told by your health care provider.  Return to your normal activities as told by your health care provider. Ask your health care provider what activities are safe for you.  Wear compression stockings if recommended by your health care provider.  Keep all follow-up visits as told by your health care provider. This is important.  How is this prevented? To lower your risk of developing this condition again: 1. For 30 or more minutes every day, do an activity that: ? Involves moving your arms and legs. ? Increases your heart rate. 2. When traveling for longer than four hours: ? Exercise your arms and legs every hour. ? Drink plenty of water. ? Avoid drinking alcohol. 3. Avoid sitting or lying for a long time without moving your legs. 4. If you have surgery or you are hospitalized, ask about ways to prevent blood clots. These may include taking frequent walks or using anticoagulants. 5. Stay at a healthy weight. 6. If you are a woman who is older than age 49, avoid unnecessary use of medicines that contain estrogen, such as some birth control pills. 7. Do not use any products that contain nicotine or tobacco, such as cigarettes and e-cigarettes. This is especially important if you take estrogen medicines. If you need help quitting, ask your health care provider.  Contact a health care provider if:  You miss a dose of your blood thinner.  Your menstrual period is heavier than usual.  You have unusual bruising.  Get help right away if: 1. You have: ? New or increased pain,  swelling, or redness in an arm or leg. ? Numbness or tingling in an arm or leg. ? Shortness of breath. ? Chest pain. ? A rapid or irregular heartbeat. ? A severe headache or confusion. ? A cut that will not stop bleeding. 2. There is blood in your vomit, stool, or urine. 3. You have a serious fall or accident, or you hit your head. 4. You feel light-headed or dizzy. 5. You cough up blood.  These symptoms may represent a serious problem that is an  emergency. Do not wait to see if the symptoms will go away. Get medical help right away. Call your local emergency services (911 in the [place] Do not drive yourself to the hospital. Summary  Deep vein thrombosis (DVT) is a condition in which a blood clot forms in a deep vein, such as a lower leg, thigh, or arm vein.  Symptoms can include swelling, warmth, pain, and redness in your leg or arm.  This condition may be treated with a blood thinner (anticoagulant medicine), medicine that is injected to dissolve blood clots,compression stockings, or surgery.  If you are prescribed blood thinners, take them exactly as told. This information is not intended to replace advice given to you by your health care provider. Make sure you discuss any questions you have with your health care provider. Document Revised: 10/15/2017 Document Reviewed: 04/02/2017 Elsevier Patient Education  [place].   ==============================================  Ischemic Stroke   An ischemic stroke is the sudden death of brain tissue. Blood carries oxygen to all areas of the body. This type of stroke happens when your blood does not flow to your brain like normal. Your brain cannot get the oxygen it needs. This is an emergency. It must be treated right away. Symptoms of a stroke usually happen all of a sudden. You may notice them when you wake up. They can include:  Weakness or loss of feeling in your face, arm, or leg. This often happens on one side of the  body.  Trouble walking.  Trouble moving your arms or legs.  Loss of balance or coordination.  Feeling confused.  Trouble talking or understanding what people are saying.  Slurred speech.  Trouble seeing.  Seeing two of one object (double vision).  Feeling dizzy.  Feeling sick to your stomach (nauseous) and throwing up (vomiting).  A very bad headache for no reason. Get help as soon as any of these problems start. This is important. Some treatments work better if they are given right away. These include:  Aspirin.  Medicines to control blood pressure.  A shot (injection) of medicine to break up the blood clot.  Treatments given in the blood vessel (artery) to take out the clot or break it up. Other treatments may include:  Oxygen.  Fluids given through an IV tube.  Medicines to thin out your blood.  Procedures to help your blood flow better.   What increases the risk? Certain things may make you more likely to have a stroke. Some of these are things that you can change, such as:  Being very overweight (obesity).  Smoking.  Taking birth control pills.  Not being active.  Drinking too much alcohol.  Using drugs. Other risk factors include:  High blood pressure.  High cholesterol.  Diabetes.  Heart disease.  Being [place] American, Native American, Hispanic, or [place] Native.  Being over age 63.  Family history of stroke.  Having had blood clots, stroke, or warning stroke (transient ischemic attack, TIA) in the past.  Sickle cell disease.  Being a woman with a history of high blood pressure in pregnancy (preeclampsia).  Migraine headache.  Sleep apnea.  Having an irregular heartbeat (atrial fibrillation).  Long-term (chronic) diseases that cause soreness and swelling (inflammation).  Disorders that affect how your blood clots.   Follow these instructions at home:  Medicines 10. Take over-the-counter and prescription medicines  only as told by your doctor. 11. If you were told to take aspirin or another medicine to thin your blood,  take it exactly as told by your doctor. ? Taking too much of the medicine can cause bleeding. ? If you do not take enough, it may not work as well. 12. Know the side effects of your medicines. If you are taking a blood thinner, make sure you: ? Hold pressure over any cuts for longer than usual. ? Tell your dentist and other doctors that you take this medicine. ? Avoid activities that may cause damage or injury to your body. ?  Eating and drinking 3. Follow instructions from your doctor about what you cannot eat or drink. 4. Eat healthy foods. 5. If you have trouble with swallowing, do these things to avoid choking: ? Take small bites when eating. ? Eat foods that are soft or pureed.  Safety 4. Follow instructions from your health care team about physical activity. 5. Use a walker or cane as told by your doctor. 6. Keep your home safe so you do not fall. This may include: ? Having experts look at your home to make sure it is safe. ? Putting grab bars in the bedroom and bathroom. ? Using raised toilets. ? Putting a seat in the shower.  General instructions 6. Do not use any tobacco products. ? Examples of these are cigarettes, chewing tobacco, and e-cigarettes. ? If you need help quitting, ask your doctor. 7. Limit how much alcohol you drink. This means no more than 1 drink a day for nonpregnant women and 2 drinks a day for men. One drink equals 12 oz of beer, 5 oz of wine, or 1 oz of hard liquor. 8. If you need help to stop using drugs or alcohol, ask your doctor to refer you to a program or specialist. 9. Stay active. Exercise as told by your doctor. 10. Keep all follow-up visits as told by your doctor. This is important.  Get help right away if:  8. You have any signs of a stroke. "BE FAST" is an easy way to remember the main warning signs: ? B - Balance. Signs are  dizziness, sudden trouble walking, or loss of balance. ? E - Eyes. Signs are trouble seeing or a change in how you see. ? F - Face. Signs are sudden weakness or loss of feeling of the face, or the face or eyelid drooping on one side. ? A - Arms. Signs are weakness or loss of feeling in an arm. This happens suddenly and usually on one side of the body. ? S - Speech. Signs are sudden trouble speaking, slurred speech, or trouble understanding what people say. ? T - Time. Time to call emergency services. Write down what time symptoms started. 9. You have other signs of a stroke, such as: ? A sudden, very bad headache with no known cause. ? Feeling sick to your stomach (nausea). ? Throwing up (vomiting). ? Jerky movements you cannot control (seizure).  These symptoms may be an emergency. Do not wait to see if the symptoms will go away. Get medical help right away. Call your local emergency services (911 in the [place] Do not drive yourself to the hospital.  Summary  An ischemic stroke is the sudden death of brain tissue.  Symptoms of a stroke usually happen all of a sudden. You may notice them when you wake up.  Get help if you have any warning signs of a stroke. This is important. Some treatments work better if they are given right away. This information is not intended to replace advice given to  you by your health care provider. Make sure you discuss any questions you have with your health care provider. Document Revised: 04/13/2018 Document Reviewed: 01/29/2016 Elsevier Patient Education  [place].   This website has more information on Xarelto: [email or web address]

## 2020-10-14 NOTE — Progress Notes (Signed)
Physical Therapy Session Note  Patient Details  Name: Justin Rose MRN: [id number] Date of Birth: [birth year]  Today's Date: 10/14/2020 PT Individual Time: 1050-1205 PT Individual Time Calculation (min): 75 min   Short Term Goals: Week 1:  PT Short Term Goal 1 (Week 1): pt to demonstrate supine<>sit CGA PT Short Term Goal 2 (Week 1): pt to demonstrate functional transfers CGA with LRAD PT Short Term Goal 3 (Week 1): pt to demonstrate gait training with LRAD for 100' CGA PT Short Term Goal 4 (Week 1): pt to improve ROM to R knee flexion to 60 degrees  Skilled Therapeutic Interventions/Progress Updates:     Patient in recliner with his wife in the room in the room upon PT arrival. Patient alert and agreeable to PT session. Patient denied pain during session except with knee flexion ROM reported muscle stretch and burning pain around incision site.  Patient expressed concerns about his knee immobilizer causing increased pain and pressure on his skin. Inspected patient's skin, no impressions at this time, noted firm edema, erythema, and increased heat in R knee. Patient and his wife with questions regarding DVT. Provided general education on risk and cause of DVT after surgery and medications patient is taking to prevent complications. Encouraged the patient to continue with mobility and ROM to reduce further clotting issues. Patient in agreement.   Therapeutic Activity: Bed Mobility: Patient performed supine to/from sit with supervision with increased time for R lower extremity management. Provided verbal cues for technique for lifting his R leg onto the bed using the knee immobilizer. Also discussed use of gait belt as a leg lifter after mobility. Transfers: Patient performed sit to/from stand x2 and stand pivot x1 with close supervision using a RW. Provided verbal cues for hand placement on RW x1, demonstrated increasing R knee flexion during transferred once cleared to perform mobility without the  knee immobilizer.  Gait Training:  Patient ambulated 150 feet using RW wearing the knee immobilizer with CGA-close supervision. Ambulated with decreased gait speed, decreased step length and R, decreased step height on L, increased B hip and knee flexion in stance L>R, forward trunk lean, and downward head gaze. Provided verbal cues for erect posture, increased knee/hip extension (patient reports this has been his gait pattern for many years), and increased R step length to promote equal step length with reciprocal gait pattern as tolerated.  Therapeutic Exercise: Patient performed the following exercises with verbal and tactile cues for proper technique. -R heel slides with 5 sec hold in flexion 2x5, provided manual overpressure for improved ROM, limited to 30-40 deg of knee flexion today -R hip abduction/adduction x10 -R quad sets with towel roll under ankle 2x10 with 5 sec hold with 30 sec stretch into extension with over pressure, lacking 5-10 deg from neutral  -R SLR x10 performing quad set prior to lifting, progressed from CGA-supervision demonstrating sufficient quad strength for weight bearing without knee immobilizer, Pam, PT made aware and cleared patient to d/c knee immobilizer exce-pt for comfort with fatigue -R knee flexion stretch in sitting with progressive ROM over 2 min for prolonged stretch  Patient in recliner with his wife in the room at end of session with breaks locked, chair alarm set, and all needs within reach.   Therapy Documentation Precautions:  Precautions Precautions: Fall Precaution Comments: Right KI Required Braces or Orthoses: Knee Immobilizer - Right Restrictions Weight Bearing Restrictions: No RLE Weight Bearing: Weight bearing as tolerated   Therapy/Group: Individual Therapy  Shivansh Hardaway L Samara Stankowski PT,  DPT  10/14/2020, 12:19 PM

## 2020-10-14 NOTE — Progress Notes (Signed)
Transitions of Care Pharmacist Note  Grey Rakestraw is a 86 y.o. male that has been diagnosed with DVT and will be prescribed Xarelto (rivaroxaban) at discharge.   Patient Education: I provided the following education on 10/14/2020 to the patient: How to take the medication Described what the medication is Signs of bleeding Answered their questions  Discharge Medications Plan: The patient wants to have their discharge medications filled by the Transitions of Care pharmacy rather than their usual pharmacy.  The discharge orders pharmacy has been changed to the Transitions of Care pharmacy, the patient will receive a phone call regarding co-pay, and their medications will be delivered by the Transitions of Care pharmacy.    Thank you,   Dimple Nanas, PharmD PGY-1 Acute Care Pharmacy Resident October 14, 2020

## 2020-10-14 NOTE — Progress Notes (Signed)
Occupational Therapy Session Note  Patient Details  Name: Justin Rose MRN: [id number] Date of Birth: 1933/11/19  Today's Date: 10/14/2020 OT Individual Time: 1980-2217 OT Individual Time Calculation (min): 73 min    Short Term Goals: Week 1:  OT Short Term Goal 1 (Week 1): STGs = LTGs d/t ELOS at Supervision level   Skilled Therapeutic Interventions/Progress Updates:    Pt greeted at time of session supine in bed with HOB elevated resting comfortable, no complaints but he is aware of RLE DVT. Unclear orders over weekend regarding mobilization, KI, etc. Adjusted KI for comfort as pt said it had been hurting back of thigh earlier. Pt did need to urinate, supine to sit EOB Supervision before sit to stand CGA with RW, doffed pants to thigh level and used urinal in standing (did not walk to bathroom d/t DVT at this time), therapist assisted with doffing brief and donning a new one as the brief straps are confusing for him. Doffed scrub shorts as well and donned new shorts sitting EOB with reacher with Min A to thread RLE but pt able to thread L foot with reacher and stand to don over hips with unilateral and no UE support. Stand pivot to recliner CGA with RW and discussed DC planning and ELOS. Pt's wife entered at this time and remained for session. BUE there ex seated with 6# dumbbells for 1x20 bicep curl, chest press, overhead press with cues for form. Spoke with MD as well and no restrictions at this time, also spoke with PA regarding KI and once able to perform hip flexion with <10* lag, can remove KI. Spoke with PT seeing the pt later as well. Pt up in recliner with alarm on, call bell in reach.    Therapy Documentation Precautions:  Precautions Precautions: Fall Precaution Comments: Right KI Required Braces or Orthoses: Knee Immobilizer - Right Restrictions Weight Bearing Restrictions: No RLE Weight Bearing: Weight bearing as tolerated     Therapy/Group: Individual Therapy  [name] 10/14/2020, 10:04 AM

## 2020-10-14 NOTE — Progress Notes (Signed)
[place] PHYSICAL MEDICINE & REHABILITATION PROGRESS NOTE   Subjective/Complaints:  Pt reports had diarrhea yesterday- stopped taking the miralax- explained will have him refuse bowel meds this Am and reduce to restart tomorrow.   Has DVT- no Sx's.  No pain for 3 days except last night, a metal piece from [place] was pinching him badly.  Dr Posey Pronto stopped CPM machine and pt said cannot bend knee. Will check if can stop KI so can bend R knee more.    ROS:  Pt denies SOB, abd pain, CP, N/V/C/D, and vision changes   Objective:   VAS [place] LOWER EXTREMITY VENOUS (DVT)  Result Date: 10/12/2020  Lower Venous DVT Study Indications: Immobility, s/p knee surgery.  Risk Factors: Surgery 09-30-2020 RT total knee arthroplasty. Comparison       10-02-2020 Bilateral lower extremity venous was negative for Study:           DVT. Performing Technologist: Darlin Coco, RDMS  Examination Guidelines: A complete evaluation includes B-mode imaging, spectral Doppler, color Doppler, and power Doppler as needed of all accessible portions of each vessel. Bilateral testing is considered an integral part of a complete examination. Limited examinations for reoccurring indications may be performed as noted. The reflux portion of the exam is performed with the patient in reverse Trendelenburg.  +---------+---------------+---------+-----------+----------+--------------+  RIGHT     Compressibility Phasicity Spontaneity Properties Thrombus Aging  +---------+---------------+---------+-----------+----------+--------------+  CFV       Full            Yes       Yes                                    +---------+---------------+---------+-----------+----------+--------------+  SFJ       Partial         Yes       Yes                    Acute           +---------+---------------+---------+-----------+----------+--------------+  FV Prox   Full                                                              +---------+---------------+---------+-----------+----------+--------------+  FV Mid    Full                                                             +---------+---------------+---------+-----------+----------+--------------+  FV Distal Full                                                             +---------+---------------+---------+-----------+----------+--------------+  PFV       Full                                                             +---------+---------------+---------+-----------+----------+--------------+  POP       Full            Yes       Yes                                    +---------+---------------+---------+-----------+----------+--------------+  PTV       Partial         Yes       Yes                    Acute           +---------+---------------+---------+-----------+----------+--------------+  PERO      Partial         Yes       Yes                    Acute           +---------+---------------+---------+-----------+----------+--------------+   +---------+---------------+---------+-----------+----------+--------------+  LEFT      Compressibility Phasicity Spontaneity Properties Thrombus Aging  +---------+---------------+---------+-----------+----------+--------------+  CFV       Full            Yes       Yes                                    +---------+---------------+---------+-----------+----------+--------------+  SFJ       Full                                                             +---------+---------------+---------+-----------+----------+--------------+  FV Prox   Full                                                             +---------+---------------+---------+-----------+----------+--------------+  FV Mid    Full                                                             +---------+---------------+---------+-----------+----------+--------------+  FV Distal Full                                                              +---------+---------------+---------+-----------+----------+--------------+  PFV       Full                                                             +---------+---------------+---------+-----------+----------+--------------+  POP  Full            Yes       Yes                                    +---------+---------------+---------+-----------+----------+--------------+  PTV       Full                                                             +---------+---------------+---------+-----------+----------+--------------+  PERO      Full                                                             +---------+---------------+---------+-----------+----------+--------------+     Summary: RIGHT: - Findings consistent with acute deep vein thrombosis involving the SF junction, right posterior tibial veins, and right peroneal veins. - No cystic structure found in the popliteal fossa.  LEFT: - There is no evidence of deep vein thrombosis in the lower extremity.  - No cystic structure found in the popliteal fossa.  *See table(s) above for measurements and observations.    Preliminary    Recent Labs    10/14/20 0604  WBC 8.6  HGB 9.3*  HCT 29.0*  PLT 410*   Recent Labs    10/14/20 0604  NA 137  K 3.8  CL 103  CO2 26  GLUCOSE 103*  BUN 15  CREATININE 0.79  CALCIUM 8.7*    Intake/Output Summary (Last 24 hours) at 10/14/2020 0952 Last data filed at 10/14/2020 0745 Gross per 24 hour  Intake 560 ml  Output 700 ml  Net -140 ml        Physical Exam: Vital Signs Blood pressure 127/78, pulse 70, temperature 98 F (36.7 C), temperature source Oral, resp. rate 16, height 5\' 11"  (1.803 m), weight 81.3 kg, SpO2 98 %. Constitutional: sitting up in bed- appropriate, NAD HENT: Normocephalic.  Atraumatic. Eyes: EOMI. No discharge. Cardiovascular: RRR Respiratory: CTA B/L- no W/R/R- good air movement GI: Soft, NT, ND, (+)BS . Skin: Warm and dry.  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 Functional deficits which require 3+ hours per day of interdisciplinary therapy in a comprehensive inpatient rehab setting.  Physiatrist is providing close team supervision and 24 hour management of active medical problems listed below.  Physiatrist and rehab team continue to assess barriers to discharge/monitor patient progress toward functional and medical goals  Care Tool:  Bathing    Body parts bathed by patient: Right arm, Left arm, Chest, Abdomen, Front perineal area, Buttocks, Right upper leg, Left upper leg, Face   Body parts bathed by helper: Left lower leg Body parts n/a: Right lower leg (in KI in standing, did  not wash)   Bathing assist Assist Level: Minimal Assistance - Patient > 75%     Upper Body Dressing/Undressing Upper body dressing   What is the patient wearing?: Pull over shirt    Upper body assist Assist Level: Supervision/Verbal cueing    Lower Body Dressing/Undressing Lower body dressing      What is the patient wearing?: Incontinence brief, Pants     Lower body assist Assist for lower body dressing: Moderate Assistance - Patient 50 - 74%     Toileting Toileting    Toileting assist Assist for toileting: Minimal Assistance - Patient > 75%     Transfers Chair/bed transfer  Transfers assist     Chair/bed transfer assist level: Minimal Assistance - Patient > 75%     Locomotion Ambulation   Ambulation assist      Assist level: Minimal Assistance - Patient > 75% Assistive device: Walker-rolling Max distance: 50   Walk 10 feet activity   Assist     Assist level: Minimal Assistance -  Patient > 75%     Walk 50 feet activity   Assist    Assist level: Minimal Assistance - Patient > 75% Assistive device: Walker-rolling    Walk 150 feet activity   Assist Walk 150 feet activity did not occur: Safety/medical concerns         Walk 10 feet on uneven surface  activity   Assist Walk 10 feet on uneven surfaces activity did not occur: Safety/medical concerns         Wheelchair     Assist Will patient use wheelchair at discharge?: No      Wheelchair assist level: Minimal Assistance - Patient > 75% Max wheelchair distance: 130    Wheelchair 50 feet with 2 turns activity    Assist        Assist Level: Minimal Assistance - Patient > 75%   Wheelchair 150 feet activity     Assist  Wheelchair 150 feet activity did not occur: Safety/medical concerns       Blood pressure 127/78, pulse 70, temperature 98 F (36.7 C), temperature source Oral, resp. rate 16, height 5\' 11"  (1.803 m), weight 81.3 kg, SpO2 98 %.  Medical Problem List and Plan: 1.  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  Continue CIR  DC CPM-discussed with patient and nursing. 2.  Antithrombotics: -DVT/anticoagulation: Xarelto ordered.   Venous Dopplers showing right saphenous vein junction, posterior tib, peroneal vein DVT             -antiplatelet therapy: ASA/Plavix DC'd, discussed with neurology, given DOAC. 3. Pain Management: Oxycodone, tramadol and/or robaxin prn.   Controlled with meds on 11/28  11/29- no real pain from knee for 3 days- con't regimen if needed  Monitor with increased exertion 4. Mood: LCSW to follow for evaluation and support.              -antipsychotic agents: N/A 5. Neuropsych: This patient is capable of making decisions on his own behalf. 6. Skin/Wound Care: Monitor incision for healing.  Added protein supplement to help promote wound healing.  7.  Fluids/Electrolytes/Nutrition: Monitor I/Os.  8. Dizziness: Monitor orthostatic vitals. Encourage fluid intake.  Orthostatics borderline positive on 11/28  Compression stockings/abdominal binder as necessary 9. ABLA:              Stool guaiacs pending             Iron supplement added  Hemoglobin  9.8 on 11/25, labs ordered for tomorrow  11/29- Hb stable- Hemoccult (+) x1 so far-   Continue to monitor 10. Statin induced myositis:   LFTs elevated on 11/25, labs ordered for tomorrow  11/29- ALT and AST much better- AST normla and ALT almost normal-con't to monitor 11. Drug induced constipation:   Bowel meds resumed.  Will consider further increase tomorrow if no bowel movement today. 12. Proteus UTI:  IV Rocephin X 3 days-->Keflex course completed on 11/27.  Repeat UA normal  Reviewed urine culture with multiple species 13. Urinary retention: Continue Flomax.    Overall improving 14.  Hyponatremia  Sodium 134 on 11/25, labs ordered for tomorrow 15.  Leukocytosis  WBCs 12.4 on 11/25, labs ordered for tomorrow  11/29- WBC 8.6- much improved  See #12 16.?  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  Lysine at home daily.  Encouraged patient to ask his wife to bring for evaluation 17.  Sleep disturbance  Benadryl 25 ordered per patient request as this is his medication PTA.  LOS: 5 days A FACE TO FACE EVALUATION WAS PERFORMED  Shelsey Rieth 10/14/2020, 9:52 AM

## 2020-10-14 NOTE — Progress Notes (Signed)
Physical Therapy Session Note  Patient Details  Name: Justin Rose MRN: [id number] Date of Birth: 06-12-1934  Today's Date: 10/14/2020 PT Individual Time: 1415-1500 PT Individual Time Calculation (min): 45 min   Short Term Goals: Week 1:  PT Short Term Goal 1 (Week 1): pt to demonstrate supine<>sit CGA PT Short Term Goal 2 (Week 1): pt to demonstrate functional transfers CGA with LRAD PT Short Term Goal 3 (Week 1): pt to demonstrate gait training with LRAD for 100' CGA PT Short Term Goal 4 (Week 1): pt to improve ROM to R knee flexion to 60 degrees  Skilled Therapeutic Interventions/Progress Updates:    Patient received sitting up in wc. Per RN, no activity restrictions related to DVT or use of KI at this time. Patient reports 5/10 pain in R knee, premedicated. PT providing rest breaks and distraction to assist with pain management. He was able to ambulate from room to therapy gym ~123ft using RW and CGA/close supervision. R hip hike, minimal R knee flexion and R circumduction gait deficits noted. Patient maintains kyphotic posture, though patient reports this is partly premorbid. With verbal cuing, patient able to achieve slightly more erect posture. Slow gait speed maintained. Patient able to achieve ~30* knee flexion dangling on therapy mat with minimal overpressure from PT. PT educating patient on functional knee ROM, breathing techniques to assist with improving ROM/minimizing pain and physiology behind restricted R knee ROM at this time. Patient able to tolerate 4 45s holds into flexion. Patient ambulating back to his room at a slightly faster gait speed with less circumduction/ hip hike noted. Patient returning to recliner, B LE elevated, chair alarm on, call light within reach.   Therapy Documentation Precautions:  Precautions Precautions: Fall Precaution Comments: Right KI Required Braces or Orthoses: Knee Immobilizer - Right Restrictions Weight Bearing Restrictions: No RLE Weight  Bearing: Weight bearing as tolerated    Therapy/Group: Individual Therapy  Karoline Caldwell, PT, DPT, CBIS  10/14/2020, 3:09 PM

## 2020-10-15 ENCOUNTER — Inpatient Hospital Stay (HOSPITAL_COMMUNITY): Payer: Medicare HMO

## 2020-10-15 ENCOUNTER — Inpatient Hospital Stay (HOSPITAL_COMMUNITY): Payer: Medicare HMO | Admitting: Occupational Therapy

## 2020-10-15 ENCOUNTER — Inpatient Hospital Stay (HOSPITAL_COMMUNITY): Payer: Medicare HMO | Admitting: Physical Therapy

## 2020-10-15 DIAGNOSIS — I6340 Cerebral infarction due to embolism of unspecified cerebral artery: Secondary | ICD-10-CM | POA: Diagnosis not present

## 2020-10-15 MED ORDER — SENNA 8.6 MG PO TABS
1.0000 | ORAL_TABLET | Freq: Every day | ORAL | Status: DC | PRN
  Administered 2020-10-16: 8.6 mg via ORAL
  Filled 2020-10-15: qty 1

## 2020-10-15 MED ORDER — SORBITOL 70 % SOLN
30.0000 mL | Freq: Once | Status: AC
  Administered 2020-10-15: 30 mL via ORAL
  Filled 2020-10-15: qty 30

## 2020-10-15 NOTE — Patient Care Conference (Signed)
Inpatient RehabilitationTeam Conference and Plan of Care Update Date: 10/15/2020   Time: 11:43 AM    Patient Name: Justin Rose      Medical Record Number: [id number]  Date of Birth: [birth year] Sex: Male         Room/Bed: 4M03C/4M03C-01 Payor Info: Payor: [employer] MEDICARE / Plan: [employer] MEDICARE HMO/PPO / Product Type: *No Product type* /    Admit Date/Time:  10/09/2020  2:40 PM  Primary Diagnosis:  OA (osteoarthritis) of knee  Hospital Problems: Principal Problem:   OA (osteoarthritis) of knee Active Problems:   Total knee replacement status   Myositis   Orthostatic hypotension   Postoperative pain   Leukocytosis   Hyponatremia   Acute deep vein thrombosis (DVT) of tibial vein of right lower extremity [facility])    Expected Discharge Date: Expected Discharge Date: 10/22/20  Team Members Present: Physician leading conference: Dr. Courtney Heys Care Coodinator Present: Dorthula Nettles, RN, BSN, CRRN;Christina [place], [place] Nurse Present: Rayne Du, LPN PT Present: Stacy Gardner, PT OT Present: Lillia Corporal, OT PPS Coordinator present : Ileana Ladd, Burna Mortimer, SLP     Current Status/Progress Goal Weekly Team Focus  Bowel/Bladder   pt is cont of ba nd b, lbm- 11/29,   remain cont of b and b   assess q shift and prn, PVR and cath over 350    Swallow/Nutrition/ Hydration             ADL's   Min A LB bathing and dressing with AE, wearing KI at this time, CGA for sit to stands and transfers, toilet transfers CGA with RW  Supervision overall  standing balance/tolerance, functional mobility, UB strength, endurance, AE retraining   Mobility   min A -CGA for transfers, bed mobility. CGA-min A gait for 400-500'. emphasis with improved ROM L knee  supervision transfers, standing balance, gait, CGA 3 stairs, min A car transfer  improving ROM L knee, improved level A to supervision with transfers, gait.   Communication             Safety/Cognition/ Behavioral Observations             Pain   pt has pain in left leg 8/10 requestig ttramadol  decrease pain in leg   assess q shift and orn medicate as needed    Skin   no current skin break down incision on right knee from knee replacment   prevent infection and breakdown   assess q shift and prn , note drainage      Discharge Planning:  Discharging home with son. Family avaliable 24/7. 1 level home, 3 steps to enter (L sided railing)   Team Discussion: Continent B/B, complaints of left leg pain 8/10, no skin breakdown. OT reports patient is min assist lower body ADL's using a reacher, contact guard to close supervision for sit to stand, and transfers. Goals are supervision. PT reports patient is working on trying to get more ROM in left knee, flexion should be at 90 degrees in a couple of weeks. Supervision goals for transfers, standing balance, min assist goals for car transfers. Patient on target to meet rehab goals: yes  *See Care Plan and progress notes for long and short-term goals.   Revisions to Treatment Plan:  MD reports patient was positive for DVT's, Xarelto added.  Teaching Needs: Continue with family education.  Current Barriers to Discharge: Decreased caregiver support, Medical stability, Home enviroment access/layout, Wound care, Lack of/limited family support and Weight bearing restrictions  Possible  Resolutions to Barriers: Continue current medications, educate wound care and dressing changes, educate on weight bearing precautions, provide emotional support for patient and family.     Medical Summary Current Status: R knee- dressing; decreased pain; continent B/B; sorbitol today for constipation; Decreased ROM of R knee- no CPM now due to new DVTs  Barriers to Discharge: Decreased family/caregiver support;Home enviroment access/layout;Medical stability;Weight bearing restrictions;Wound care  Barriers to Discharge Comments: R knee looks well; LE DVTs; limited R knee ROM- 25-30 degrees flexion  right now Possible Resolutions to Barriers/Weekly Focus: CGA- SBA OT; pain meds prn; sorbitol today;  d/c 10/22/20 per pt request to leave ASAP   Continued Need for Acute Rehabilitation Level of Care: The patient requires daily medical management by a physician with specialized training in physical medicine and rehabilitation for the following reasons: Direction of a multidisciplinary physical rehabilitation program to maximize functional independence : Yes Medical management of patient stability for increased activity during participation in an intensive rehabilitation regime.: Yes Analysis of laboratory values and/or radiology reports with any subsequent need for medication adjustment and/or medical intervention. : Yes   I attest that I was present, lead the team conference, and concur with the assessment and plan of the team.   Cristi Loron 10/15/2020, 3:07 PM

## 2020-10-15 NOTE — Progress Notes (Signed)
Patient ID: Justin Rose, male   DOB: [birth year], 86 y.o.   MRN: [id number] Team Conference Report to Patient/Family  Team Conference discussion was reviewed with the patient and caregiver, including goals, any changes in plan of care and target discharge date.  Patient and caregiver express understanding and are in agreement.  The patient has a target discharge date of 10/22/20.  Dyanne Iha 10/15/2020, 1:07 PM

## 2020-10-15 NOTE — Progress Notes (Signed)
Physical Therapy Session Note  Patient Details  Name: Justin Rose MRN: [id number] Date of Birth: 1934/08/06  Today's Date: 10/15/2020 PT Individual Time: 0915-1030 PT Individual Time Calculation (min): 75 min   Short Term Goals: Week 1:  PT Short Term Goal 1 (Week 1): pt to demonstrate supine<>sit CGA PT Short Term Goal 2 (Week 1): pt to demonstrate functional transfers CGA with LRAD PT Short Term Goal 3 (Week 1): pt to demonstrate gait training with LRAD for 100' CGA PT Short Term Goal 4 (Week 1): pt to improve ROM to R knee flexion to 60 degrees  Skilled Therapeutic Interventions/Progress Updates:    pt received in bed and agreeable to therapy. Pt denied pain at start of session reported "soreness" at end of session. Pt aware of RLE DVT and currently no restrictions with mobility and KI is not required for mobility. Pt directed in supine>sit EOB min A with management of RLE slightly to clear bed, pt reported he needed to void bladder directed in Sit to stand EOB to Rolling walker min A, pt able to place urinal and min A for doffing shorts and brief slightly, (+) void. Pt directed in gait training with Rolling walker for 500' x2 with seated rest break between reps 2/2 fatigue grossly CGA with one instance of min A with walker repositioning. During gait, VC for heel/toe pattern, decreased external rotation of RLE, and attempting to normalize gait pattern with increased knee flexion for swing phase however limited by ROM. Pt returned to room and requested to return to recliner,directed in seated knee flexion ROM exercises actively, x10 with 5 sec holds at pt tolerated end range, without increased pain. Pt then left in recliner, BLE elevated, ice pack applied to RLE, nursing aware and agreeable. Alarm set, All needs in reach and in good condition. Call light in hand.    Therapy Documentation Precautions:  Precautions Precautions: Fall Precaution Comments: Right KI Required Braces or Orthoses: Knee  Immobilizer - Right Restrictions Weight Bearing Restrictions: No RLE Weight Bearing: Weight bearing as tolerated General:   Vital Signs:   Pain:   Mobility:   Locomotion :    Trunk/Postural Assessment :    Balance:   Exercises:   Other Treatments:      Therapy/Group: Individual Therapy  Junie Panning 10/15/2020, 12:58 PM

## 2020-10-15 NOTE — Progress Notes (Signed)
Occupational Therapy Session Note  Patient Details  Name: Justin Rose MRN: [id number] Date of Birth: [birth year]  Today's Date: 10/15/2020 OT Individual Time: 1300-1410 OT Individual Time Calculation (min): 70 min    Short Term Goals: Week 1:  OT Short Term Goal 1 (Week 1): STGs = LTGs d/t ELOS at Supervision level   Skilled Therapeutic Interventions/Progress Updates:    Pt greeted at time of session sitting up in recliner agreeable to OT session, wife [name] present throughout. Pt agreeable to shower level bathing, ambulated chair to shower with CGA/close supervision with RW and no KI today. Shower seat facing outward to allow for lack of knee flexion. Doffed clothing in standing prior to sitting with CGA, once seated on TTB performed UB bathing set up and LB bathing Min A to reach buttocks and R foot only, pt able to forward weight shift and figure four for LLE. Assist for thoroughness to clean in between toes and dry as well in same manner. Pt stating he felt like he couldn't lateral weight shift on bench, but did lift buttocks by pushing through BUEs to lift for therapist to wash and dry, will plan to try shower forward facing next session if knee flexion allows. Donned brief in shower with max A for straps with pt in static standing before walking to bed level with CGA. UB dress set up, LB dress CGA with pt able to thread BLEs without AE and don over hips in standing at RW. Wife [name] present throughout. Also discussed home set up, pt stating his walk in shower will not fit a shower seat but does have a tub so TTB may be an option. Pt also shared Betty measured bed height at home to be 25" and car height from floorboard to ground is 17". Alarm on, call bell in reach.   Therapy Documentation Precautions:  Precautions Precautions: Fall Precaution Comments: Right KI Required Braces or Orthoses: Knee Immobilizer - Right Restrictions Weight Bearing Restrictions: No RLE Weight Bearing: Weight  bearing as tolerated     Therapy/Group: Individual Therapy  [name] 10/15/2020, 2:17 PM

## 2020-10-15 NOTE — Progress Notes (Signed)
Physical Therapy Session Note  Patient Details  Name: Justin Rose MRN: [id number] Date of Birth: February 20, 1934  Today's Date: 10/15/2020 PT Individual Time: 1135-1205 PT Individual Time Calculation (min): 30 min   Short Term Goals: Week 1:  PT Short Term Goal 1 (Week 1): pt to demonstrate supine<>sit CGA PT Short Term Goal 2 (Week 1): pt to demonstrate functional transfers CGA with LRAD PT Short Term Goal 3 (Week 1): pt to demonstrate gait training with LRAD for 100' CGA PT Short Term Goal 4 (Week 1): pt to improve ROM to R knee flexion to 60 degrees  Skilled Therapeutic Interventions/Progress Updates:     Patient in recliner with his wife in the room upon PT arrival. Patient alert and agreeable to PT session. Patient reported 4/10 R knee pain during session, however noted grimacing and mild crying out with ROM exercises and patient stating, "I don't know how to use that scale." RN made aware. PT provided repositioning, rest breaks, and distraction as pain interventions throughout session.   Focused session on R knee ROM with patient seated in recliner. Performed knee flexion with gravity assist in sitting with hip flexed >90 degrees and manual over pressure at patient's tibia 2x30 sec. Knee extension with gravity assist with leg supported by therapist and gentle manual over pressure into extension 2x1 min. Patient performed sit to stand x2 with close supervision using RW to allow PT to remove and replace w/c cushion from recliner for increased hip and knee flexion ROM in sitting during exercises. Patient continues to required to have his R foot far forward during transfers. Instructed patient to bend his knee as much as possible with functional mobility to promote increased ROM. Patient performed self knee flexion ROM with foot on floor, PT blocking his foot to promote increased range, 10 sec hold between progressing ROM x4 for 2 trials. Seated R hamstring and gastroc stretch with decreased knee  extension due to muscle tightness 3x30 sec.   Patient progressed to grossly 50 deg of knee flexion and lacking 5 deg of knee extension following ROM exercises.   Reviewed education discussed during yesterdays session about TED hose wearing schedule, d/c of KI, importance of knee ROM for functional mobility. Patient able to teach back all education without assist.   Patient in recliner with his wife in the room at end of session with breaks locked, chair alarm set, and all needs within reach.   Therapy Documentation Precautions:  Precautions Precautions: Fall Precaution Comments: Right KI Required Braces or Orthoses: Knee Immobilizer - Right Restrictions Weight Bearing Restrictions: No RLE Weight Bearing: Weight bearing as tolerated    Therapy/Group: Individual Therapy  [name] L Vikram Tillett PT, DPT  10/15/2020, 9:54 PM

## 2020-10-15 NOTE — Progress Notes (Signed)
Physical Therapy Session Note  Patient Details  Name: Justin Rose MRN: [id number] Date of Birth: 1934/11/11  Today's Date: 10/15/2020 PT Individual Time: 1615-1700 PT Individual Time Calculation (min): 45 min   Short Term Goals: Week 1:  PT Short Term Goal 1 (Week 1): pt to demonstrate supine<>sit CGA PT Short Term Goal 2 (Week 1): pt to demonstrate functional transfers CGA with LRAD PT Short Term Goal 3 (Week 1): pt to demonstrate gait training with LRAD for 100' CGA PT Short Term Goal 4 (Week 1): pt to improve ROM to R knee flexion to 60 degrees  Skilled Therapeutic Interventions/Progress Updates:    Pt received seated in recliner in room, agreeable to PT session. No complaints of pain, does report soreness in R knee. Sit to stand with Supervision to RW throughout session. Ambulation 2 x 200 ft with use of RW at Supervision to Anderson level. Pt tends to ambulate with flexed trunk posture, cues for upright stance during gait. Pt also exhibits decreased R knee flexion which affects overall gait mechanics. Session focus on use of Nustep at level 4 to increase R knee ROM x 5 min with use of BUE to control knee flexion/extension as tolerated. Pt reports urge to urinate at end of session, standing balance with intermittent UE support on RW while using urinal at Supervision level. Pt left seated in recliner in room with needs in reach, ice pack to R knee at end of session.  Therapy Documentation Precautions:  Precautions Precautions: Fall Precaution Comments: Right KI Required Braces or Orthoses: Knee Immobilizer - Right Restrictions Weight Bearing Restrictions: No RLE Weight Bearing: Weight bearing as tolerated    Therapy/Group: Individual Therapy   Excell Seltzer, PT, DPT  10/15/2020, 5:13 PM

## 2020-10-15 NOTE — Progress Notes (Signed)
[place] PHYSICAL MEDICINE & REHABILITATION PROGRESS NOTE   Subjective/Complaints:  Pt reports done with KI- is off now.  Has never responded to Voltaren gel topical- so doesn't want to try in place of meloxicam- explained to pt cannot have meloxicam for arthritis til off Xarelto.  Also, feels bloated- in "small intestine"- high up.  Asking to take Lysine for cold sores.    ROS:   Pt denies SOB, abd pain, CP, N/V/C/D, and vision changes   Objective:   No results found. Recent Labs    10/14/20 0604  WBC 8.6  HGB 9.3*  HCT 29.0*  PLT 410*   Recent Labs    10/14/20 0604  NA 137  K 3.8  CL 103  CO2 26  GLUCOSE 103*  BUN 15  CREATININE 0.79  CALCIUM 8.7*    Intake/Output Summary (Last 24 hours) at 10/15/2020 0854 Last data filed at 10/15/2020 0720 Gross per 24 hour  Intake 1440 ml  Output 2250 ml  Net -810 ml        Physical Exam: Vital Signs Blood pressure 111/80, pulse 63, temperature 98.5 F (36.9 C), resp. rate 16, height 5\' 11"  (1.803 m), weight 81.3 kg, SpO2 97 %. Constitutional: awake, sitting up in bed; appropriate, NAD HENT: Normocephalic.  Atraumatic. Eyes: EOMI. No discharge. Cardiovascular: RRR Respiratory: CTA B/L- no W/R/R- good air movement GI: soft, but distended- normoactive- NT Skin: Warm and dry.  R knee swollen from TKR, RLE is swollen down to ankle, of note. 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 Functional deficits which require 3+ hours per day of interdisciplinary therapy in a comprehensive inpatient rehab setting.  Physiatrist is providing close team supervision and 24 hour management of active medical  problems listed below.  Physiatrist and rehab team continue to assess barriers to discharge/monitor patient progress toward functional and medical goals  Care Tool:  Bathing    Body parts bathed by patient: Right arm, Left arm, Chest, Abdomen, Front perineal area, Buttocks, Right upper leg, Left upper leg, Face   Body parts bathed by helper: Left lower leg Body parts n/a: Right lower leg (in KI in standing, did not wash)   Bathing assist Assist Level: Minimal Assistance - Patient > 75%     Upper Body Dressing/Undressing Upper body dressing   What is the patient wearing?: Pull over shirt    Upper body assist Assist Level: Supervision/Verbal cueing    Lower Body Dressing/Undressing Lower body dressing      What is the patient wearing?: Pants     Lower body assist Assist for lower body dressing: Minimal Assistance - Patient > 75% (with reacher)     Toileting Toileting    Toileting assist Assist for toileting: Minimal Assistance - Patient > 75%     Transfers Chair/bed transfer  Transfers assist     Chair/bed transfer assist level: Supervision/Verbal cueing     Locomotion Ambulation   Ambulation assist      Assist level: Contact Guard/Touching assist Assistive device: Walker-rolling Max distance: 100   Walk 10 feet activity   Assist     Assist level: Supervision/Verbal cueing Assistive device: Walker-rolling   Walk 50 feet activity  Assist    Assist level: Contact Guard/Touching assist Assistive device: Walker-rolling    Walk 150 feet activity   Assist Walk 150 feet activity did not occur: Safety/medical concerns  Assist level: Contact Guard/Touching assist Assistive device: Walker-rolling    Walk 10 feet on uneven surface  activity   Assist Walk 10 feet on uneven surfaces activity did not occur: Safety/medical concerns         Wheelchair     Assist Will patient use wheelchair at discharge?: No      Wheelchair assist  level: Minimal Assistance - Patient > 75% Max wheelchair distance: 130    Wheelchair 50 feet with 2 turns activity    Assist        Assist Level: Minimal Assistance - Patient > 75%   Wheelchair 150 feet activity     Assist      Assist Level: Minimal Assistance - Patient > 75%   Blood pressure 111/80, pulse 63, temperature 98.5 F (36.9 C), resp. rate 16, height 5\' 11"  (1.803 m), weight 81.3 kg, SpO2 97 %.  Medical Problem List and Plan: 1.  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  Continue CIR  DC CPM-discussed with patient and nursing. 2.  Antithrombotics: -DVT/anticoagulation: Xarelto ordered.   Venous Dopplers showing right saphenous vein junction, posterior tib, peroneal vein DVT             -antiplatelet therapy: ASA/Plavix DC'd, discussed with neurology, given DOAC. 3. Pain Management: Oxycodone, tramadol and/or robaxin prn.   Controlled with meds on 11/28  11/29- no real pain from knee for 3 days- con't regimen if needed  11/30- pain from arthritis- cannot start meloxicam, unfortunately due to being on Xarelto- explained to pt- will need to con't tramadol prn  Monitor with increased exertion 4. Mood: LCSW to follow for evaluation and support.              -antipsychotic agents: N/A 5. Neuropsych: This patient is capable of making decisions on his own behalf. 6. Skin/Wound Care: Monitor incision for healing.  Added protein supplement to help promote wound healing.  7. Fluids/Electrolytes/Nutrition: Monitor I/Os.  8. Dizziness: Monitor orthostatic vitals. Encourage fluid intake.  Orthostatics borderline positive on 11/28  Compression stockings/abdominal binder as necessary 9. ABLA:              Stool guaiacs pending             Iron supplement added  Hemoglobin 9.8 on 11/25, labs ordered for tomorrow  11/29- Hb stable- Hemoccult (+) x1 so far-   Continue to monitor 10. Statin  induced myositis:   LFTs elevated on 11/25, labs ordered for tomorrow  11/29- ALT and AST much better- AST normla and ALT almost normal-con't to monitor 11. Drug induced constipation:   Bowel meds resumed.  Will consider further increase tomorrow if no bowel movement today.  11/30- will give dose of Sorbitol at 3pm- also add another senokot prn 12. Proteus UTI:  IV Rocephin X 3 days-->Keflex course completed on 11/27.  Repeat UA normal  Reviewed urine culture with multiple species 13. Urinary retention: Continue Flomax.    Overall improving 14.  Hyponatremia  Sodium 134 on 11/25, labs ordered for tomorrow  11/30- Na up to 137 15.  Leukocytosis  WBCs 12.4 on 11/25, labs ordered for tomorrow  11/29- WBC 8.6- much improved  See #12 16.?  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  Lysine  at home daily.  Encouraged patient to ask his wife to bring for evaluation  11/30- can take lysine daily- have wife bring in 32.  Sleep disturbance  Benadryl 25 ordered per patient request as this is his medication PTA.  LOS: 6 days A FACE TO FACE EVALUATION WAS PERFORMED  Tenna Lacko 10/15/2020, 8:54 AM

## 2020-10-16 ENCOUNTER — Inpatient Hospital Stay (HOSPITAL_COMMUNITY): Payer: Medicare HMO | Admitting: Physical Therapy

## 2020-10-16 ENCOUNTER — Inpatient Hospital Stay (HOSPITAL_COMMUNITY): Payer: Medicare HMO

## 2020-10-16 ENCOUNTER — Inpatient Hospital Stay (HOSPITAL_COMMUNITY): Payer: Medicare HMO | Admitting: Occupational Therapy

## 2020-10-16 DIAGNOSIS — I6340 Cerebral infarction due to embolism of unspecified cerebral artery: Secondary | ICD-10-CM | POA: Diagnosis not present

## 2020-10-16 LAB — OCCULT BLOOD X 1 CARD TO LAB, STOOL: Fecal Occult Bld: NEGATIVE

## 2020-10-16 MED ORDER — MAGNESIUM HYDROXIDE 400 MG/5ML PO SUSP
10.0000 mL | Freq: Once | ORAL | Status: AC
  Administered 2020-10-16: 10 mL via ORAL
  Filled 2020-10-16: qty 30

## 2020-10-16 NOTE — Progress Notes (Signed)
Occupational Therapy Session Note  Patient Details  Name: Justin Rose MRN: [id number] Date of Birth: Jul 24, 1934  Today's Date: 10/16/2020 OT Individual Time: 1356-1510 OT Individual Time Calculation (min): 74 min    Short Term Goals: Week 1:  OT Short Term Goal 1 (Week 1): STGs = LTGs d/t ELOS at Supervision level   Skilled Therapeutic Interventions/Progress Updates:    Pt greeted at time of session sitting up in recliner with wife [name] present, who remained for part of the session but did leave. Pt's wife walked to shower room since she had to leave soon and therapist demonstrated TTB transfer as the pt has a walk in shower but we do not think he will have enough knee flexion to sit on shower chair in walk in. Pt c/o mild knee pain throughout walking and standing activity later in session, no number, rest breaks PRN. Pt static standing at recliner to use urinal CGA for clothing management (+) void urine. Pt walked room > shower room CGA with RW and therapist demonstrated TTB transfer, pt did not want to perform today but said he "understood how it would work." Walked to ADL apartment in same manner and transferred to various surfaces including low couch and bed surface, all CGA with more difficulty from couch. Therapist demonstrating posterior entrance for walk in shower if he is able to use at home to step over lip, pt demonstrating with good carryover. Extensive conversation and problem solving regarding which shower to use as his walk in shower will not fit a seat per pt and he cannot obtain enough knee flexion at this time to sit and tub shower unit has a sliding door which may be able to remove, plan to continue to problem solve. Pt does show some decreased safety with recommendations to problem solve through shower. 3x30 rebounder in standing first with lighweight green ball and then with 2kg weighted ball to improve standing balance. Walked back to room CGA, set up in recliner alarm on call bell  in reach.    shower, rebounder, apartment, convo  Therapy Documentation Precautions:  Precautions Precautions: Fall Precaution Comments: Right KI Required Braces or Orthoses: Knee Immobilizer - Right Restrictions Weight Bearing Restrictions: No RLE Weight Bearing: Weight bearing as tolerated     Therapy/Group: Individual Therapy  [name] 10/16/2020, 3:14 PM

## 2020-10-16 NOTE — Progress Notes (Signed)
Physical Therapy Session Note  Patient Details  Name: Justin Rose MRN: [id number] Date of Birth: 1934-02-28  Today's Date: 10/16/2020 PT Individual Time: 1630-1705 PT Individual Time Calculation (min): 35 min   Short Term Goals: Week 1:  PT Short Term Goal 1 (Week 1): pt to demonstrate supine<>sit CGA PT Short Term Goal 2 (Week 1): pt to demonstrate functional transfers CGA with LRAD PT Short Term Goal 3 (Week 1): pt to demonstrate gait training with LRAD for 100' CGA PT Short Term Goal 4 (Week 1): pt to improve ROM to R knee flexion to 60 degrees  Skilled Therapeutic Interventions/Progress Updates:    Pt received seated in recliner in room, agreeable to PT session. Pt reports 4/10 "soreness" in R knee and frustrated by this as he has not experienced too much pain and/or soreness up to this point. Education with patient that focus during AM session was R knee ROM with use of Nustep and having soreness following increase in AAROM will increase soreness in joint. Pt understanding of education but remains frustrated by pain and mobility limitations at this time. Provided ice pack at end of session for pain management as well as nursing being about to provide pain medication. Sit to stand with CGA to RW. Ambulation x 200 ft with RW and Supervision to CGA for balance. Pt reports R knee "loosening up" during gait. Standing balance with RW while using urinal. Pt left seated in recliner in room with needs in reach, chair alarm in place at end of session.  Therapy Documentation Precautions:  Precautions Precautions: Fall Precaution Comments: Right KI Required Braces or Orthoses: Knee Immobilizer - Right Restrictions Weight Bearing Restrictions: No RLE Weight Bearing: Weight bearing as tolerated    Therapy/Group: Individual Therapy   Excell Seltzer, PT, DPT  10/16/2020, 6:05 PM

## 2020-10-16 NOTE — Progress Notes (Addendum)
Physical Therapy Session Note  Patient Details  Name: Justin Rose MRN: [id number] Date of Birth: 28-Jul-1934  Today's Date: 10/16/2020 PT Individual Time: 0830-0930 PT Individual Time Calculation (min): 60 min   Short Term Goals: Week 1:  PT Short Term Goal 1 (Week 1): pt to demonstrate supine<>sit CGA PT Short Term Goal 2 (Week 1): pt to demonstrate functional transfers CGA with LRAD PT Short Term Goal 3 (Week 1): pt to demonstrate gait training with LRAD for 100' CGA PT Short Term Goal 4 (Week 1): pt to improve ROM to R knee flexion to 60 degrees  Skilled Therapeutic Interventions/Progress Updates:    Pt received seated in recliner in room, agreeable to PT session. No complaints of pain. Sit to stand with Supervision to RW throughout session. Ambulation up to 200 ft with RW at Supervision level during session, cues for upright posture as pt tends to exhibit flexed trunk posture. Nustep level 4 x 5 min with use of BUE to control R knee ROM. Pt exhibits 69 degrees knee flexion with use of Nustep to increase ROM. Pt exhibits improved R knee ROM during gait following stretching on Nustep. Provided pt with elbow pads upon request due to frequency of hitting his elbows on things and being on blood thinners. Ascend/descend 8 x 6" steps with 2 handrails and CGA, step-to gait pattern. Sit to stand from low, pliable surface of couch with close Supervision to RW. Per pt report he has furniture at home of similar height. Sit to/from supine on real bed in therapy apartment with Supervision to simulate home environment. Standing balance with intermittent use of RW for balance and Supervision while using urinal. See Flowsheet for details. Pt left seated in recliner in room with needs in reach at end of session, ice pack to R knee.  Therapy Documentation Precautions:  Precautions Precautions: Fall Precaution Comments: Right KI Required Braces or Orthoses: Knee Immobilizer - Right Restrictions Weight Bearing  Restrictions: No RLE Weight Bearing: Weight bearing as tolerated     Therapy/Group: Individual Therapy   Excell Seltzer, PT, DPT  10/16/2020, 12:19 PM

## 2020-10-16 NOTE — Progress Notes (Signed)
[place] PHYSICAL MEDICINE & REHABILITATION PROGRESS NOTE   Subjective/Complaints:  Pt is getting 25-30 degrees flexion from R knee currently- PT working on actively.  No BM in 4 days now, except tiny one 2 days ago.  Would like Milk of Mg, since worked so well 4 days ago.  Of note, doesn't like taste.   ROS:   Pt denies SOB, abd pain, CP, N/V/C/D, and vision changes  Objective:   No results found. Recent Labs    10/14/20 0604  WBC 8.6  HGB 9.3*  HCT 29.0*  PLT 410*   Recent Labs    10/14/20 0604  NA 137  K 3.8  CL 103  CO2 26  GLUCOSE 103*  BUN 15  CREATININE 0.79  CALCIUM 8.7*    Intake/Output Summary (Last 24 hours) at 10/16/2020 0831 Last data filed at 10/16/2020 5462 Gross per 24 hour  Intake 1380 ml  Output 2300 ml  Net -920 ml        Physical Exam: Vital Signs Blood pressure (!) 124/91, pulse 86, temperature 98.6 F (37 C), temperature source Oral, resp. rate 18, height 5\' 11"  (1.803 m), weight 81.3 kg, SpO2 98 %. Constitutional: awake, sitting up in bedside chair, appropriate, NAD HENT: Normocephalic.  Atraumatic. Eyes: EOMI. No discharge. Cardiovascular: RRR Respiratory: CTA B/L- no W/R/R- good air movement GI: soft, NT, distended, hypoactive BS Skin: Warm and dry. Took off surgical dressing- knee looks great- incision healing great- less swelling; no erythema; no drainage.  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 Functional deficits which require 3+ hours per day of interdisciplinary therapy in a comprehensive inpatient rehab setting.  Physiatrist is providing close team supervision and 24 hour management of active medical problems listed below.  Physiatrist  and rehab team continue to assess barriers to discharge/monitor patient progress toward functional and medical goals  Care Tool:  Bathing    Body parts bathed by patient: Right arm, Left arm, Chest, Abdomen, Front perineal area, Buttocks, Right upper leg, Left upper leg, Face   Body parts bathed by helper: Left lower leg, Buttocks Body parts n/a: Right lower leg (in KI in standing, did not wash)   Bathing assist Assist Level: Minimal Assistance - Patient > 75%     Upper Body Dressing/Undressing Upper body dressing   What is the patient wearing?: Pull over shirt    Upper body assist Assist Level: Set up assist    Lower Body Dressing/Undressing Lower body dressing      What is the patient wearing?: Pants     Lower body assist Assist for lower body dressing: Contact Guard/Touching assist     Toileting Toileting    Toileting assist Assist for toileting: Minimal Assistance - Patient > 75%     Transfers Chair/bed transfer  Transfers assist     Chair/bed transfer assist level: Supervision/Verbal cueing     Locomotion Ambulation   Ambulation assist      Assist level: Contact Guard/Touching assist Assistive device: Walker-rolling Max distance: 200'   Walk 10 feet activity   Assist     Assist level: Contact Guard/Touching assist Assistive device: Walker-rolling   Walk 50 feet activity   Assist    Assist level: Contact Guard/Touching  assist Assistive device: Walker-rolling    Walk 150 feet activity   Assist Walk 150 feet activity did not occur: Safety/medical concerns  Assist level: Contact Guard/Touching assist Assistive device: Walker-rolling    Walk 10 feet on uneven surface  activity   Assist Walk 10 feet on uneven surfaces activity did not occur: Safety/medical concerns         Wheelchair     Assist Will patient use wheelchair at discharge?: No      Wheelchair assist level: Minimal Assistance - Patient > 75% Max  wheelchair distance: 130    Wheelchair 50 feet with 2 turns activity    Assist        Assist Level: Minimal Assistance - Patient > 75%   Wheelchair 150 feet activity     Assist      Assist Level: Minimal Assistance - Patient > 75%   Blood pressure (!) 124/91, pulse 86, temperature 98.6 F (37 C), temperature source Oral, resp. rate 18, height 5\' 11"  (1.803 m), weight 81.3 kg, SpO2 98 %.  Medical Problem List and Plan: 1.  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  Continue CIR  DC CPM-discussed with patient and nursing.  12/1- daily dressing changes written for- took off surgical dressing 2.  Antithrombotics: -DVT/anticoagulation: Xarelto ordered.   Venous Dopplers showing right saphenous vein junction, posterior tib, peroneal vein DVT             -antiplatelet therapy: ASA/Plavix DC'd, discussed with neurology, given DOAC. 3. Pain Management: Oxycodone, tramadol and/or robaxin prn.   Controlled with meds on 11/28  11/29- no real pain from knee for 3 days- con't regimen if needed  11/30- pain from arthritis- cannot start meloxicam, unfortunately due to being on Xarelto- explained to pt- will need to con't tramadol prn  12/1- con't pain meds- con't regimen  Monitor with increased exertion 4. Mood: LCSW to follow for evaluation and support.              -antipsychotic agents: N/A 5. Neuropsych: This patient is capable of making decisions on his own behalf. 6. Skin/Wound Care: Monitor incision for healing.  Added protein supplement to help promote wound healing.  7. Fluids/Electrolytes/Nutrition: Monitor I/Os.  8. Dizziness: Monitor orthostatic vitals. Encourage fluid intake.  Orthostatics borderline positive on 11/28  Compression stockings/abdominal binder as necessary 9. 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 Drug induced constipation:   Bowel meds resumed.  Will consider further increase tomorrow if no bowel movement today.  11/30- will give dose of Sorbitol at 3pm- also add another senokot prn  12/1- no BM- will give milk of Magnesium 25ml 12. Proteus UTI:  IV Rocephin X 3 days-->Keflex course completed on 11/27.  Repeat UA normal  Reviewed urine culture with multiple species 13. Urinary retention: Continue Flomax.    Overall improving 14.  Hyponatremia  Sodium 134 on 11/25, labs ordered for tomorrow  11/30- Na up to 137 15.  Leukocytosis  WBCs 12.4 on 11/25, labs  ordered for tomorrow  11/29- WBC 8.6- much improved  See #12 16.?  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  Lysine at home daily.  Encouraged patient to ask his wife to bring for evaluation  11/30- can take lysine daily- have wife bring in 88.  Sleep disturbance  Benadryl 25 ordered per patient request as this is his medication PTA.  LOS: 7 days A FACE TO FACE EVALUATION WAS PERFORMED  Justin Rose 10/16/2020, 8:31 AM

## 2020-10-16 NOTE — Progress Notes (Signed)
Physical Therapy Session Note  Patient Details  Name: Justin Rose MRN: [id number] Date of Birth: 1934-09-28  Today's Date: 10/16/2020 PT Individual Time: 1015-1045 PT Individual Time Calculation (min): 30 min   Short Term Goals: Week 1:  PT Short Term Goal 1 (Week 1): pt to demonstrate supine<>sit CGA PT Short Term Goal 2 (Week 1): pt to demonstrate functional transfers CGA with LRAD PT Short Term Goal 3 (Week 1): pt to demonstrate gait training with LRAD for 100' CGA PT Short Term Goal 4 (Week 1): pt to improve ROM to R knee flexion to 60 degrees  Skilled Therapeutic Interventions/Progress Updates:     Pt received seated in recliner with legs elevated and ice on R knee. PT educates pt on ON/OFF ice application for optimal effectiveness and prevention of skin breakdown. Pt reports soreness in R knee but number not provided. Mobilization, rest breaks, and ice utilized to manage pain symptoms. Sit to stand with supervision and RW. PT cues for hip extension and posture for optimal gait mechanics. Pt ambulates with reciprocal gait pattern and supervision, x200'. Cues provided to increase upright gaze to improve balance and decrease risk for falls. Pt ambulates up/down ramp x2 with cues on R foot clearance and increasing proximity to RW for safety.   In sitting, pt works on R knee ROM, flexing knee by sliding foot posteriorly on floor and allowing soft tissues to passively stretch with foot planted. PT provides manual over pressure and pt able to achieve 55 degrees flexion. Pt educated on performing quad sets while seated and pt achieves 3 degrees lacking of full knee extension. Pt ambulates 100' back to room with supervision. Left seated with ice in place and all needs within reach.  Therapy Documentation Precautions:  Precautions Precautions: Fall Precaution Comments: Right KI Required Braces or Orthoses: Knee Immobilizer - Right Restrictions Weight Bearing Restrictions: No RLE Weight Bearing:  Weight bearing as tolerated   Therapy/Group: Individual Therapy  Breck Coons, PT, DPT 10/16/2020, 12:15 PM

## 2020-10-17 ENCOUNTER — Inpatient Hospital Stay (HOSPITAL_COMMUNITY): Payer: Medicare HMO

## 2020-10-17 ENCOUNTER — Inpatient Hospital Stay (HOSPITAL_COMMUNITY): Payer: Medicare HMO | Admitting: Occupational Therapy

## 2020-10-17 DIAGNOSIS — I6340 Cerebral infarction due to embolism of unspecified cerebral artery: Secondary | ICD-10-CM | POA: Diagnosis not present

## 2020-10-17 MED ORDER — SENNOSIDES-DOCUSATE SODIUM 8.6-50 MG PO TABS
2.0000 | ORAL_TABLET | Freq: Every day | ORAL | Status: DC
  Administered 2020-10-17 – 2020-10-21 (×5): 2 via ORAL
  Filled 2020-10-17 (×5): qty 2

## 2020-10-17 MED ORDER — POLYSACCHARIDE IRON COMPLEX 150 MG PO CAPS
150.0000 mg | ORAL_CAPSULE | Freq: Every day | ORAL | Status: DC
  Administered 2020-10-18 – 2020-10-22 (×5): 150 mg via ORAL
  Filled 2020-10-17 (×5): qty 1

## 2020-10-17 NOTE — Progress Notes (Signed)
Occupational Therapy Weekly Progress Note  Patient Details  Name: Justin Rose MRN: [id number] Date of Birth: [birth year]  Beginning of progress report period: October 11, 2020 End of progress report period: October 17, 2020  Today's Date: 10/17/2020 OT Individual Time: 9381-8299 OT Individual Time Calculation (min): 62 min    The pt did not have any STGs as he is staying longer than originally planned, goals continue to be at an overall supervision level. He is CGA/close supervision for functional mobility to/from bathroom, and performs toilet transfers in same manner. He is obtaining more knee flexion which makes sitting on commode for longer periods more tolerable and successful. He is CGA with LB dressing and Min A with shower level bathing. Continuing to problem solve home bathroom situation as he is confident in using walk in with no seat but therapy is recommending shower seat or TTB in other bathroom. Pt is making steady progress toward OT goals.   Patient continues to demonstrate the following deficits: muscle weakness, decreased cardiorespiratoy endurance and decreased standing balance, decreased postural control and decreased balance strategies and therefore will continue to benefit from skilled OT intervention to enhance overall performance with BADL and Reduce care partner burden.  Patient progressing toward long term goals..  Continue plan of care.  OT Short Term Goals Week 1:  OT Short Term Goal 1 (Week 1): STGs = LTGs d/t ELOS at Supervision level Week 2:  OT Short Term Goal 1 (Week 2): STGs = LTGs d/t ELOS at supervision level  Skilled Therapeutic Interventions/Progress Updates:    Pt greeted at time of session sitting upright in recliner with wife betty present who left shortly after session began. Session focus on functional mobility to/from bathroom which he completed close supervision/CGA and toilet transfer in the same manner. Pt did have BM and (+) bladder void on commode,  CGA for hygiene as he was able to reach back to reach buttocks in standing but does stand in flexed posture and encouraged to stand more upright. Declined shower/dressing today in favor of tomorrow. Discussion again regarding walk in shower vs. Tub shower at home, pt feels like he can stand in walk in shower without seat instead of having shower seat or TTB in tub shower but continue to recommend seat for energy conservation and fall prevention. Pt walked to/from gym with CGA/close supervision with RW and transferred to/from Nustep in same manner, performed 5 mins on level 2 mainly for ROM for R knee and not strength as he reported stiffness and there was edema present in thigh and knee. Walked back to room in same manner, alarm on call bell in reach.   Therapy Documentation Precautions:  Precautions Precautions: Fall Precaution Comments: Right KI Required Braces or Orthoses: Knee Immobilizer - Right Restrictions Weight Bearing Restrictions: No RLE Weight Bearing: Weight bearing as tolerated     Therapy/Group: Individual Therapy  [name] 10/17/2020, 2:00 PM

## 2020-10-17 NOTE — Progress Notes (Signed)
[place] PHYSICAL MEDICINE & REHABILITATION PROGRESS NOTE   Subjective/Complaints:  Pt reports LBM this AM; not uncomfortable to go.   OK increasing bowel meds slightly since required Milk of Magnesium yesterday to have BM.    ROS:   Pt denies SOB, abd pain, CP, N/V/C/D, and vision changes   Objective:   No results found. No results for input(s): WBC, HGB, HCT, PLT in the last 72 hours. No results for input(s): NA, K, CL, CO2, GLUCOSE, BUN, CREATININE, CALCIUM in the last 72 hours.  Intake/Output Summary (Last 24 hours) at 10/17/2020 0833 Last data filed at 10/17/2020 0753 Gross per 24 hour  Intake 780 ml  Output 1110 ml  Net -330 ml        Physical Exam: Vital Signs Blood pressure 127/84, pulse 76, temperature 99 F (37.2 C), resp. rate 16, height 5\' 11"  (1.803 m), weight 81.3 kg, SpO2 97 %. Constitutional: awake, alert, appropriate, sitting up in bedside chair, NAD HENT: Normocephalic.  Atraumatic. Eyes: EOMI. No discharge. Cardiovascular: RRR Respiratory: CTA B/L- no W/R/R- good air movement GI: Soft, NT, ND, (+)BS  Skin: Warm and dry. No dressing on R knee- bruised and slightly edematous, but looks fantastic otherwise- no erythema, no drainage- healed over.  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 Functional deficits which require 3+ hours per day of interdisciplinary therapy in a comprehensive inpatient rehab setting.  Physiatrist is providing close team supervision and 24 hour management of active medical problems listed below.  Physiatrist and rehab team continue to assess barriers to discharge/monitor patient progress toward functional and medical goals  Care Tool:  Bathing    Body parts  bathed by patient: Right arm, Left arm, Chest, Abdomen, Front perineal area, Buttocks, Right upper leg, Left upper leg, Face   Body parts bathed by helper: Left lower leg, Buttocks Body parts n/a: Right lower leg (in KI in standing, did not wash)   Bathing assist Assist Level: Minimal Assistance - Patient > 75%     Upper Body Dressing/Undressing Upper body dressing   What is the patient wearing?: Pull over shirt    Upper body assist Assist Level: Set up assist    Lower Body Dressing/Undressing Lower body dressing      What is the patient wearing?: Pants     Lower body assist Assist for lower body dressing: Contact Guard/Touching assist     Toileting Toileting    Toileting assist Assist for toileting: Contact Guard/Touching assist (urinal in standing)     Transfers Chair/bed transfer  Transfers assist     Chair/bed transfer assist level: Supervision/Verbal cueing     Locomotion Ambulation   Ambulation assist      Assist level: Supervision/Verbal cueing Assistive device: Walker-rolling Max distance: 200'   Walk 10 feet activity   Assist     Assist level: Supervision/Verbal cueing Assistive device: Walker-rolling   Walk 50 feet activity   Assist    Assist level: Supervision/Verbal cueing Assistive device: Walker-rolling    Walk 150 feet activity   Assist Walk 150 feet activity did not occur: Safety/medical concerns  Assist level: Supervision/Verbal cueing Assistive device: Walker-rolling    Walk 10 feet on uneven surface  activity   Assist Walk 10 feet  on uneven surfaces activity did not occur: Safety/medical concerns   Assist level: Supervision/Verbal cueing Assistive device: Aeronautical engineer Will patient use wheelchair at discharge?: No      Wheelchair assist level: Minimal Assistance - Patient > 75% Max wheelchair distance: 130    Wheelchair 50 feet with 2 turns activity    Assist         Assist Level: Minimal Assistance - Patient > 75%   Wheelchair 150 feet activity     Assist      Assist Level: Minimal Assistance - Patient > 75%   Blood pressure 127/84, pulse 76, temperature 99 F (37.2 C), resp. rate 16, height 5\' 11"  (1.803 m), weight 81.3 kg, SpO2 97 %.  Medical Problem List and Plan: 1.  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  Continue CIR  DC CPM-discussed with patient and nursing.  12/1- daily dressing changes written for- took off surgical dressing  12/2- looks great 2.  Antithrombotics: -DVT/anticoagulation: Xarelto ordered.   Venous Dopplers showing right saphenous vein junction, posterior tib, peroneal vein DVT             -antiplatelet therapy: ASA/Plavix DC'd, discussed with neurology, given DOAC. 3. Pain Management: Oxycodone, tramadol and/or robaxin prn.   Controlled with meds on 11/28  11/29- no real pain from knee for 3 days- con't regimen if needed  11/30- pain from arthritis- cannot start meloxicam, unfortunately due to being on Xarelto- explained to pt- will need to con't tramadol prn  12/2- pain controlled- con't tramadol prn  Monitor with increased exertion 4. Mood: LCSW to follow for evaluation and support.              -antipsychotic agents: N/A 5. Neuropsych: This patient is capable of making decisions on his own behalf. 6. Skin/Wound Care: Monitor incision for healing.  Added protein supplement to help promote wound healing.  7. Fluids/Electrolytes/Nutrition: Monitor I/Os.  8. Dizziness: Monitor orthostatic vitals. Encourage fluid intake.  Orthostatics borderline positive on 11/28  Compression stockings/abdominal binder as necessary 9. ABLA:              Stool guaiacs pending             Iron supplement added  Hemoglobin 9.8 on 11/25, labs ordered for tomorrow  11/29- Hb stable- Hemoccult (+) x1 so far-   Continue to monitor 10. Statin induced  myositis:   LFTs elevated on 11/25, labs ordered for tomorrow  11/29- ALT and AST much better- AST normal and ALT almost normal-con't to monitor 11. Drug induced constipation:   Bowel meds resumed.  Will consider further increase tomorrow if no bowel movement today.  11/30- will give dose of Sorbitol at 3pm- also add another senokot prn  12/1- no BM- will give milk of Magnesium 42ml  12/2- will decrease iron to daily and increase senokot to 2 tabs daily-  12. Proteus UTI:  IV Rocephin X 3 days-->Keflex course completed on 11/27.  Repeat UA normal  Reviewed urine culture with multiple species 13. Urinary retention: Continue Flomax.    Overall improving 14.  Hyponatremia  Sodium 134 on 11/25, labs ordered for tomorrow  11/30- Na up to 137 15.  Leukocytosis  WBCs 12.4 on 11/25, labs ordered for tomorrow  11/29- WBC 8.6- much improved  See #12 16.?  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  Lysine at home daily.  Encouraged patient to  ask his wife to bring for evaluation  11/30- can take lysine daily- have wife bring in 62.  Sleep disturbance  Benadryl 25 ordered per patient request as this is his medication PTA.  12/2- sleeping much better- con't regimen  LOS: 8 days A FACE TO FACE EVALUATION WAS PERFORMED  Justin Rose 10/17/2020, 8:33 AM

## 2020-10-17 NOTE — Plan of Care (Signed)
  Problem: Consults Goal: RH STROKE PATIENT EDUCATION Description: See Patient Education module for education specifics  Outcome: Progressing   Problem: RH SKIN INTEGRITY Goal: RH STG SKIN FREE OF INFECTION/BREAKDOWN Description: Assess skin q shift and as needed Outcome: Progressing   Problem: RH SAFETY Goal: RH STG ADHERE TO SAFETY PRECAUTIONS W/ASSISTANCE/DEVICE Description: STG Adhere to Safety Precautions With Mod I Assistance/Device. Outcome: Progressing Goal: RH STG DECREASED RISK OF FALL WITH ASSISTANCE Description: STG Decreased Risk of Fall With Mod I Assistance. Outcome: Progressing

## 2020-10-17 NOTE — Progress Notes (Signed)
Physical Therapy Session Note  Patient Details  Name: Justin Rose MRN: [id number] Date of Birth: 11/13/1934  Today's Date: 10/17/2020 PT Individual Time:Session1: 4098-1191; Arthor Captain: [id number] PT Individual Time Calculation (min): 58 min & 60 min   Short Term Goals: Week 1:  PT Short Term Goal 1 (Week 1): pt to demonstrate supine<>sit CGA PT Short Term Goal 2 (Week 1): pt to demonstrate functional transfers CGA with LRAD PT Short Term Goal 3 (Week 1): pt to demonstrate gait training with LRAD for 100' CGA PT Short Term Goal 4 (Week 1): pt to improve ROM to R knee flexion to 60 degrees  Skilled Therapeutic Interventions/Progress Updates:    Session1: Patient in recliner.  Reports still trouble bending the knee.  Performed seated heel slides x 8-10 prior to donning shoes.  Sit to stand from recliner with S.  Ambulated to therapy gym x 140' with S with RW and cues for posture, R knee flexion in terminal stance.  In parallel bars working on Aeronautical engineer with cues for R knee flexion and heel strike several rounds in bars noticing R ankle pronation and hip ER almost crossing R heel in front of L foot.  Performed standing heel cord stretch on 2" step 1 foot at a time 3 x 30 sec each foot.  Performed standing hip abduction, hamstring curls x 2 x 10 each.  Sit to sidelying on mat with min A for R LE.  Performed soft tissue work to R IT band for loosening tissues, edema control and pain management using cross friction massage, foam roller and strumming.  Kinesiotaping to R IT band "I" strip I-O with tissue on stretch with off the paper tension; then two "Y" strips to R knee for edema and pain control with off the paper tension and joint on stretch with pt in supine.  Patient supine to sit with S. Sit to stand S and pt ambulated to room with S with RW.  Left seated in recliner with needs in reach, chair alarm set and educated to call for ice to knee for 20 min on time.   [place]:  PROM measurements L LE -10  to 125; R -5 to 50 Patient in recliner agreeable to PT, reports earlier working on Nu Step to stretch the leg and had more pain with extension.  Patient sit to stand with S and ambulated with RW and S x 100'.  Patient sit to supine with S to perform AAROM R knee flexion and SLR with mod cues for quad control.  Measurements taken as noted above.  Patient supine to sit with S.  Performed Berg balance assessment as noted below.  Balance activities at counter top with intermittent 2 vs 1 vs no UE support: side stepping, forward and back walking, same with narrow BOS, static standing, dynamic reaching and stacking cones.  Performed seated postural exercises with towel at spine: scapular squeezes x 10 w/ 5 sec hold, horizontal abduction with diagonal pulling with orange t-band, bilateral horizontal abduction and seated rows x 10 each.  Patient sit to stand from chair with S.  Ambulated to room with RW and cues throughout for posture.  Patient left seated in recliner with chair alarm active and needs in reach.  Therapy Documentation Precautions:  Precautions Precautions: Fall Precaution Comments: Right KI Required Braces or Orthoses: Knee Immobilizer - Right Restrictions Weight Bearing Restrictions: No RLE Weight Bearing: Weight bearing as tolerated Pain: Pain Assessment Pain Score: 5  Pain Type: Acute pain Pain Location:  Knee Pain Orientation: Right Pain Descriptors / Indicators: Sore Pain Onset: With Activity Pain Intervention(s): Ambulation/increased activity  Balance: Standardized Balance Assessment Standardized Balance Assessment: Berg Balance Test Berg Balance Test Sit to Stand: Able to stand  independently using hands Standing Unsupported: Able to stand 2 minutes with supervision Sitting with Back Unsupported but Feet Supported on Floor or Stool: Able to sit safely and securely 2 minutes Stand to Sit: Controls descent by using hands Transfers: Able to transfer with verbal cueing and /or  supervision Standing Unsupported with Eyes Closed: Able to stand 10 seconds with supervision Standing Ubsupported with Feet Together: Able to place feet together independently but unable to hold for 30 seconds From Standing, Reach Forward with Outstretched Arm: Can reach forward >5 cm safely (2") From Standing Position, Pick up Object from Floor: Able to pick up shoe, needs supervision From Standing Position, Turn to Look Behind Over each Shoulder: Needs assist to keep from losing balance and falling Turn 360 Degrees: Needs assistance while turning Standing Unsupported, Alternately Place Feet on Step/Stool: Able to complete >2 steps/needs minimal assist Standing Unsupported, One Foot in Front: Able to take small step independently and hold 30 seconds Standing on One Leg: Unable to try or needs assist to prevent fall Total Score: 28    Therapy/Group: Individual Therapy  Reginia Naas  Magda Kiel, PT 10/17/2020, 9:37 AM

## 2020-10-18 ENCOUNTER — Inpatient Hospital Stay (HOSPITAL_COMMUNITY): Payer: Medicare HMO

## 2020-10-18 DIAGNOSIS — I6340 Cerebral infarction due to embolism of unspecified cerebral artery: Secondary | ICD-10-CM | POA: Diagnosis not present

## 2020-10-18 NOTE — Progress Notes (Addendum)
Physical Therapy Weekly Progress Note  Patient Details  Name: Justin Rose MRN: [id number] Date of Birth: [birth year]  Beginning of progress report period: October 10, 2020 End of progress report period: October 18, 2020  Today's Date: 10/18/2020 PT Individual Time: Session1: [id number]; Session2: 1300-1400 PT Individual Time Calculation (min): 59 min & 60 min  Patient has met 4 of 4 short term goals.  Patient able to meet all STG and progressing close to S level for LTG's.  Patient continues with decreased balance with likely baseline postural deficits, decreased R knee AROM and strength with continued edema managing with ice, elevation, soft tissue work and taping.  Feel he will continue to progress during remainder of stay on CIR and from follow up outpatient PT at d/c.   Patient continues to demonstrate the following deficits muscle weakness and muscle joint tightness and becreased balance  and therefore will continue to benefit from skilled PT intervention to increase functional independence with mobility.  Patient progressing toward long term goals..  Continue plan of care.  PT Short Term Goals Week 1:  PT Short Term Goal 1 (Week 1): pt to demonstrate supine<>sit CGA PT Short Term Goal 1 - Progress (Week 1): Met PT Short Term Goal 2 (Week 1): pt to demonstrate functional transfers CGA with LRAD PT Short Term Goal 2 - Progress (Week 1): Met PT Short Term Goal 3 (Week 1): pt to demonstrate gait training with LRAD for 100' CGA PT Short Term Goal 3 - Progress (Week 1): Met PT Short Term Goal 4 (Week 1): pt to improve ROM to R knee flexion to 60 degrees PT Short Term Goal 4 - Progress (Week 1): Met Week 2:  PT Short Term Goal 1 (Week 2): LTG=STG due to ELOS  Skilled Therapeutic Interventions/Progress Updates:  Session 1: Pt found in recliner, performed STS transfers throughout session with supervision. Pt amb ~200 ft x2 with RW with head turns with supervision and VC's for back and neck  extension. Pt used Nu Step machine for ~5 minutes with R knee flexion reaching 60 degrees AAROM. Pt performed stair negotiation with unilateral L handrail and single point cane with a step-to pattern with CGA x1 for safety and VC's for sequencing. Pt performed unilateral step ups with the RLE for increased foot clearance during gait and to stretch the R knee into flexion. Pt then performed standing balance exercises at the counter with CGA for steadying at first, progressing to supervision for sequencing, including crossovers with BUE support, and standing with feet together; was able to maintain balance for 90 seconds unsupported with feet together. Pt also performed single leg balance on the L leg and balanced with one foot in front of the other. Pt also performed curb/step negotiation with RW with supervision and VC's for sequencing and foot placement, to simulate his step in his house, which he states is the same height at the step used. Pt amb as noted above to room. Pt left in recliner with seat alarm on and call button within reach and wife in room.   Session 2: Pt found in recliner, performed STS transfers throughout session with supervision. Pt amb ~150 ft x2 with RW with supervision and VC's for back and neck extension. Pt used Nu step machine for 5 minutes to increase knee flexion dynamic ROM. Pt then performed car transfer at simulated small SUV height which he states is his car to ground height with CGA x1 for safety and VC's for hand and foot placement.  Pt was instructed to push off the seat of the car and stand up straight before grabbing the RW for safety. Pt then amb backwards ~8 feet with CGA x1 for safety and TC's to avoid obstacles. Pt then performed mini squats at the counter with supervision and VC's and demonstration for proper form. Pt then performed standing terminal knee extensions of RLE with a ball behind his leg against the wall with supervision and VC's for proper positioning and use  of quadriceps to push back onto the ball. Pt then amb ~10 ft to treatment table with supervision and transferred stand to sit and sit to supine with supervision. Pt then performed SAQ on RLE with a bolster under his RLE and VC's and TC's for quad activation. Pt also performed SLR on RLE with a pillow under the leg with supervision and VC's to prevent quad lag. Pt amb as noted above to his room. Pt left in recliner with seat alarm on and call button within reach.   Ambulation/gait training;Discharge planning;Functional mobility training;Psychosocial support;Therapeutic Activities;Visual/perceptual remediation/compensation;Wheelchair propulsion/positioning;Therapeutic Exercise;Skin care/wound management;Neuromuscular re-education;Balance/vestibular training;Disease management/prevention;Cognitive remediation/compensation;DME/adaptive equipment instruction;Pain management;UE/LE Strength taining/ROM;Splinting/orthotics;UE/LE Coordination activities;Stair training;Patient/family education;Community reintegration   Therapy Documentation Precautions:  Precautions Precautions: Fall Precaution Comments: Right KI Required Braces or Orthoses: Knee Immobilizer - Right Restrictions Weight Bearing Restrictions: No RLE Weight Bearing: Weight bearing as tolerated    Pain: Pain Assessment Pain Score: 5  Pain Type: Acute pain Pain Location: Knee Pain Orientation: Right Pain Descriptors / Indicators: Grimacing;Discomfort Pain Onset: With Activity Pain Intervention(s): Elevated extremity;Rest   Therapy/Group: Individual Therapy  Reginia Naas [place], PT 10/18/2020, 10:25 AM   Dulce Sellar, SPT

## 2020-10-18 NOTE — Progress Notes (Signed)
[facility] PHYSICAL MEDICINE & REHABILITATION PROGRESS NOTE   Subjective/Complaints:  2 BMs yesterday- knee still real stiff.   Using Ktaping on R knee.   ROS:   Pt denies SOB, abd pain, CP, N/V/C/D, and vision changes   Objective:   No results found. No results for input(s): WBC, HGB, HCT, PLT in the last 72 hours. No results for input(s): NA, K, CL, CO2, GLUCOSE, BUN, CREATININE, CALCIUM in the last 72 hours.  Intake/Output Summary (Last 24 hours) at 10/18/2020 0827 Last data filed at 10/18/2020 0700 Gross per 24 hour  Intake 1740 ml  Output 1200 ml  Net 540 ml        Physical Exam: Vital Signs Blood pressure 127/83, pulse 76, temperature 98 F (36.7 C), resp. rate 20, height 5\' 11"  (1.803 m), weight 81.3 kg, SpO2 97 %. Constitutional: awake, alert, appropriate, sitting up in bedside chair, NAD HENT: Normocephalic.  Atraumatic. Eyes: EOMI. No discharge. Cardiovascular: RRR Respiratory: CTA B/L- no W/R/R- good air movement GI: Soft, NT, ND, (+)BS  Skin: Warm and dry. No dressing on R knee- bruised and slightly edematous, but looks fantastic otherwise- no erythema, no drainage- healed over.  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 Functional deficits which require 3+ hours per day of interdisciplinary therapy in a comprehensive inpatient rehab setting.  Physiatrist is providing close team supervision and 24 hour management of active medical problems listed below.  Physiatrist and rehab team continue to assess barriers to discharge/monitor patient progress toward functional and medical goals  Care Tool:  Bathing    Body parts bathed by patient: Right arm, Left arm, Chest,  Abdomen, Front perineal area, Buttocks, Right upper leg, Left upper leg, Face   Body parts bathed by helper: Left lower leg, Buttocks Body parts n/a: Right lower leg (in KI in standing, did not wash)   Bathing assist Assist Level: Minimal Assistance - Patient > 75%     Upper Body Dressing/Undressing Upper body dressing   What is the patient wearing?: Pull over shirt    Upper body assist Assist Level: Set up assist    Lower Body Dressing/Undressing Lower body dressing      What is the patient wearing?: Pants     Lower body assist Assist for lower body dressing: Contact Guard/Touching assist     Toileting Toileting    Toileting assist Assist for toileting: Contact Guard/Touching assist     Transfers Chair/bed transfer  Transfers assist     Chair/bed transfer assist level: Supervision/Verbal cueing     Locomotion Ambulation   Ambulation assist      Assist level: Contact Guard/Touching assist Assistive device: Walker-rolling Max distance: 120   Walk 10 feet activity   Assist     Assist level: Supervision/Verbal cueing Assistive device: Walker-rolling   Walk 50 feet activity   Assist    Assist level: Contact Guard/Touching assist Assistive device: Walker-rolling    Walk 150 feet activity   Assist Walk 150 feet activity did not occur: Safety/medical concerns  Assist level: Supervision/Verbal cueing Assistive device: Walker-rolling    Walk 10 feet on uneven surface  activity   Assist Walk 10 feet on uneven surfaces activity did not occur: Safety/medical  concerns   Assist level: Supervision/Verbal cueing Assistive device: Aeronautical engineer Will patient use wheelchair at discharge?: No      Wheelchair assist level: Minimal Assistance - Patient > 75% Max wheelchair distance: 130    Wheelchair 50 feet with 2 turns activity    Assist        Assist Level: Minimal Assistance - Patient > 75%    Wheelchair 150 feet activity     Assist      Assist Level: Minimal Assistance - Patient > 75%   Blood pressure 127/83, pulse 76, temperature 98 F (36.7 C), resp. rate 20, height 5\' 11"  (1.803 m), weight 81.3 kg, SpO2 97 %.  Medical Problem List and Plan: 1.  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  Continue CIR  DC CPM-discussed with patient and nursing.  12/1- daily dressing changes written for- took off surgical dressing  12/3- looks great- K taping being used 2.  Antithrombotics: -DVT/anticoagulation: Xarelto ordered.   Venous Dopplers showing right saphenous vein junction, posterior tib, peroneal vein DVT             -antiplatelet therapy: ASA/Plavix DC'd, discussed with neurology, given DOAC. 3. Pain Management: Oxycodone, tramadol and/or robaxin prn.   Controlled with meds on 11/28  11/30- pain from arthritis- cannot start meloxicam, unfortunately due to being on Xarelto- explained to pt- will need to con't tramadol prn  12/3- con't tramadol prn- pain contorlled  Monitor with increased exertion 4. Mood: LCSW to follow for evaluation and support.              -antipsychotic agents: N/A 5. Neuropsych: This patient is capable of making decisions on his own behalf. 6. Skin/Wound Care: Monitor incision for healing.  Added protein supplement to help promote wound healing.  7. Fluids/Electrolytes/Nutrition: Monitor I/Os.  8. Dizziness: Monitor orthostatic vitals. Encourage fluid intake.  Orthostatics borderline positive on 11/28  Compression stockings/abdominal binder as necessary  12/3- doing well- no Sx's- con't regimen 9. ABLA:              Stool guaiacs pending             Iron supplement added  Hemoglobin 9.8 on 11/25, labs ordered for tomorrow  11/29- Hb stable- Hemoccult (+) x1 so far-   Continue to monitor 10. Statin induced myositis:   LFTs elevated on 11/25, labs ordered for  tomorrow  11/29- ALT and AST much better- AST normal and ALT almost normal-con't to monitor  12/3- will recheck Monday 11. Drug induced constipation:   Bowel meds resumed.  Will consider further increase tomorrow if no bowel movement today.  11/30- will give dose of Sorbitol at 3pm- also add another senokot prn  12/1- no BM- will give milk of Magnesium 66ml  12/2- will decrease iron to daily and increase senokot to 2 tabs daily-   12/3- had 2 BMs yesterday 12. Proteus UTI:  IV Rocephin X 3 days-->Keflex course completed on 11/27.  Repeat UA normal  Reviewed urine culture with multiple species 13. Urinary retention: Continue Flomax.    Overall improving 14.  Hyponatremia  Sodium 134 on 11/25, labs ordered for tomorrow  11/30- Na up to 137 15.  Leukocytosis  WBCs 12.4 on 11/25, labs ordered for tomorrow  11/29- WBC 8.6- much improved  See #12 16.?  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  Lysine at home daily.  Encouraged patient  to ask his wife to bring for evaluation  11/30- can take lysine daily- have wife bring in 8.  Sleep disturbance  Benadryl 25 ordered per patient request as this is his medication PTA.  12/2- sleeping much better- con't regimen  LOS: 9 days A FACE TO FACE EVALUATION WAS PERFORMED  Trystan Akhtar 10/18/2020, 8:27 AM

## 2020-10-18 NOTE — Plan of Care (Signed)
  Problem: Consults Goal: RH STROKE PATIENT EDUCATION Description: See Patient Education module for education specifics  Outcome: Progressing   Problem: RH SKIN INTEGRITY Goal: RH STG SKIN FREE OF INFECTION/BREAKDOWN Description: Assess skin q shift and as needed Outcome: Progressing   Problem: RH SAFETY Goal: RH STG ADHERE TO SAFETY PRECAUTIONS W/ASSISTANCE/DEVICE Description: STG Adhere to Safety Precautions With Mod I Assistance/Device. Outcome: Progressing Goal: RH STG DECREASED RISK OF FALL WITH ASSISTANCE Description: STG Decreased Risk of Fall With Mod I Assistance. Outcome: Progressing

## 2020-10-18 NOTE — Progress Notes (Signed)
Patient ID: Justin Rose, male   DOB: 02-28-1934, 86 y.o.   MRN: [id number]   family education scheduled Monday, December 6th 9-11AM with pt spouse.   [place], [place]

## 2020-10-18 NOTE — Progress Notes (Signed)
Occupational Therapy Session Note  Patient Details  Name: Justin Rose MRN: [id number] Date of Birth: [birth year]  Today's Date: 10/18/2020 OT Individual Time: 1500-1600 OT Individual Time Calculation (min): 60 min    Short Term Goals: Week 1:  OT Short Term Goal 1 (Week 1): STGs = LTGs d/t ELOS at Supervision level  Skilled Therapeutic Interventions/Progress Updates:    1;1. Pt received in recliner with pain in R knee but not rated and rest provided pt agreeable to standing showering to see if this is appropriate at DC. Pt with no grab bars at home after showering with grab bars and use for bending forward towards feet, OT recommends this is appropriate at home with supervision from wife as long as grab bars are installed into the wall and not suction. Pt verbalized understanding and sent photo to wife. Pt completes dressing sit to stand with supervision except for A to don teds using plastic bag method. Exited session with pt seated in bed, exit alarm on and call light in reach   Therapy Documentation Precautions:  Precautions Precautions: Fall Precaution Comments: Right KI Required Braces or Orthoses: Knee Immobilizer - Right Restrictions Weight Bearing Restrictions: No RLE Weight Bearing: Weight bearing as tolerated General:   Vital Signs:   Pain:   ADL: ADL Grooming: Setup Upper Body Bathing: Supervision/safety Where Assessed-Upper Body Bathing: Sitting at sink Lower Body Bathing: Minimal assistance Where Assessed-Lower Body Bathing: Standing at sink Upper Body Dressing: Supervision/safety Where Assessed-Upper Body Dressing: Sitting at sink Lower Body Dressing: Moderate assistance Vision   Perception    Praxis   Exercises:   Other Treatments:     Therapy/Group: Individual Therapy  Tonny Branch 10/18/2020, 4:41 PM

## 2020-10-19 ENCOUNTER — Inpatient Hospital Stay (HOSPITAL_COMMUNITY): Payer: Medicare HMO | Admitting: Physical Therapy

## 2020-10-19 DIAGNOSIS — I82441 Acute embolism and thrombosis of right tibial vein: Secondary | ICD-10-CM | POA: Diagnosis not present

## 2020-10-19 DIAGNOSIS — M1711 Unilateral primary osteoarthritis, right knee: Secondary | ICD-10-CM | POA: Diagnosis not present

## 2020-10-19 DIAGNOSIS — E871 Hypo-osmolality and hyponatremia: Secondary | ICD-10-CM | POA: Diagnosis not present

## 2020-10-19 NOTE — Plan of Care (Signed)
  Problem: Consults Goal: RH STROKE PATIENT EDUCATION Description: See Patient Education module for education specifics  Outcome: Progressing   Problem: RH SKIN INTEGRITY Goal: RH STG SKIN FREE OF INFECTION/BREAKDOWN Description: Assess skin q shift and as needed Outcome: Progressing   Problem: RH SAFETY Goal: RH STG ADHERE TO SAFETY PRECAUTIONS W/ASSISTANCE/DEVICE Description: STG Adhere to Safety Precautions With Mod I Assistance/Device. Outcome: Progressing Goal: RH STG DECREASED RISK OF FALL WITH ASSISTANCE Description: STG Decreased Risk of Fall With Mod I Assistance. Outcome: Progressing

## 2020-10-19 NOTE — Progress Notes (Signed)
[facility] PHYSICAL MEDICINE & REHABILITATION PROGRESS NOTE   Subjective/Complaints:  Right knee still stiff. Trying to work on ROM at EOB when I came in  ROS: Patient denies fever, rash, sore throat, blurred vision, nausea, vomiting, diarrhea, cough, shortness of breath or chest pain,   back pain, headache, or mood change.   Objective:   No results found. No results for input(s): WBC, HGB, HCT, PLT in the last 72 hours. No results for input(s): NA, K, CL, CO2, GLUCOSE, BUN, CREATININE, CALCIUM in the last 72 hours.  Intake/Output Summary (Last 24 hours) at 10/19/2020 0801 Last data filed at 10/19/2020 0700 Gross per 24 hour  Intake 1140 ml  Output 1285 ml  Net -145 ml        Physical Exam: Vital Signs Blood pressure 115/82, pulse 88, temperature 98.5 F (36.9 C), temperature source Oral, resp. rate 20, height 5\' 11"  (1.803 m), weight 81.3 kg, SpO2 96 %. Constitutional: No distress . Vital signs reviewed. HEENT: EOMI, oral membranes moist Neck: supple Cardiovascular: RRR without murmur. No JVD    Respiratory/Chest: CTA Bilaterally without wheezes or rales. Normal effort    GI/Abdomen: BS +, non-tender, non-distended Ext: no clubbing, cyanosis, or edema Psych: pleasant and cooperative Skin: knee wound CDI. 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 Normal insight and awareness. Intact Memory. Normal language and speech. Cranial nerve exam unremarkable  Motor: B/l UE: 5/5 proximal to distal RLE: HF 4-/5, KE 3/5 ADF 4/5 (some pain inhibition), unchanged LLE: HF, KE 4/5, ADF 4/5   Assessment/Plan: 1. Functional deficits which require 3+ hours per day of interdisciplinary therapy in a comprehensive inpatient rehab setting.  Physiatrist is providing close team supervision and 24 hour management of active  medical problems listed below.  Physiatrist and rehab team continue to assess barriers to discharge/monitor patient progress toward functional and medical goals  Care Tool:  Bathing    Body parts bathed by patient: Right arm, Left arm, Chest, Abdomen, Front perineal area, Buttocks, Right upper leg, Left upper leg, Face, Left lower leg, Right lower leg   Body parts bathed by helper: Left lower leg, Buttocks Body parts n/a: Right lower leg (in KI in standing, did not wash)   Bathing assist Assist Level: Supervision/Verbal cueing (standing with grab bar)     Upper Body Dressing/Undressing Upper body dressing   What is the patient wearing?: Pull over shirt    Upper body assist Assist Level: Supervision/Verbal cueing (standing doffing/sitting donning-set up)    Lower Body Dressing/Undressing Lower body dressing      What is the patient wearing?: Pants     Lower body assist Assist for lower body dressing: Supervision/Verbal cueing     Toileting Toileting    Toileting assist Assist for toileting: Contact Guard/Touching assist     Transfers Chair/bed transfer  Transfers assist     Chair/bed transfer assist level: Supervision/Verbal cueing     Locomotion Ambulation   Ambulation assist      Assist level: Supervision/Verbal cueing Assistive device: Walker-rolling Max distance: 150   Walk 10 feet activity   Assist     Assist level: Supervision/Verbal cueing Assistive device: Walker-rolling   Walk 50 feet activity   Assist    Assist level: Supervision/Verbal cueing Assistive device: Walker-rolling    Walk 150 feet activity   Assist Walk 150 feet  activity did not occur: Safety/medical concerns  Assist level: Supervision/Verbal cueing Assistive device: Walker-rolling    Walk 10 feet on uneven surface  activity   Assist Walk 10 feet on uneven surfaces activity did not occur: Safety/medical concerns   Assist level: Supervision/Verbal  cueing Assistive device: Aeronautical engineer Will patient use wheelchair at discharge?: No      Wheelchair assist level: Minimal Assistance - Patient > 75% Max wheelchair distance: 130    Wheelchair 50 feet with 2 turns activity    Assist        Assist Level: Minimal Assistance - Patient > 75%   Wheelchair 150 feet activity     Assist      Assist Level: Minimal Assistance - Patient > 75%   Blood pressure 115/82, pulse 88, temperature 98.5 F (36.9 C), temperature source Oral, resp. rate 20, height 5\' 11"  (1.803 m), weight 81.3 kg, SpO2 96 %.  Medical Problem List and Plan: 1.  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  Continue CIR  DC CPM-discussed with patient and nursing.  12/1- daily dressing changes written for- took off surgical dressing  12/4- reviewed knee ROM exercises with patient   -still has a lot of swelling/bruising which are making it difficult 2.  Antithrombotics: -DVT/anticoagulation: Xarelto ordered.   Venous Dopplers showing right saphenous vein junction, posterior tib, peroneal vein DVT             -antiplatelet therapy: ASA/Plavix DC'd, discussed with neurology, given DOAC. 3. Pain Management: Oxycodone, tramadol and/or robaxin prn.   Controlled with meds on 11/28  11/30- pain from arthritis- cannot start meloxicam, unfortunately due to being on Xarelto- explained to pt- will need to con't tramadol prn  12/3-4- con't tramadol prn- pain contorlled  Monitor with increased exertion 4. Mood: LCSW to follow for evaluation and support.              -antipsychotic agents: N/A 5. Neuropsych: This patient is capable of making decisions on his own behalf. 6. Skin/Wound Care: Monitor incision for healing.  Added protein supplement to help promote wound healing.  7. Fluids/Electrolytes/Nutrition: Monitor I/Os.  8. Dizziness: Monitor orthostatic vitals.  Encourage fluid intake.  Orthostatics borderline positive on 11/28  Compression stockings/abdominal binder as necessary  12/3- doing well- no Sx's- con't regimen 9. 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 Drug induced constipation:   Bowel meds resumed.  Will consider further increase tomorrow if no bowel movement today.  11/30- will give dose of Sorbitol at 3pm- also add another senokot prn  12/1- no BM- will give milk of Magnesium 7ml  12/2- will decrease iron to daily and increase senokot to 2 tabs daily-   12/3-4- moving bowels 12. Proteus UTI:  IV Rocephin X 3 days-->Keflex course completed on 11/27.  Repeat UA normal  Reviewed urine culture with multiple species 13. Urinary retention: Continue Flomax.    Overall improving 14.  Hyponatremia  Sodium 134 on 11/25, labs ordered for tomorrow  11/30- Na up to 137 15.  Leukocytosis  WBCs 12.4 on 11/25, labs ordered for tomorrow  11/29- WBC 8.6- much improved  See #12 16.?  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  Lysine at home daily.  Encouraged patient to ask his wife to bring for evaluation  11/30- can take lysine daily- have wife bring in 57.  Sleep disturbance  Benadryl 25 ordered per patient request as this is his medication PTA.  12/2- sleeping much better- con't regimen  LOS: 10 days A FACE TO [place] 10/19/2020, 8:01 AM

## 2020-10-19 NOTE — Progress Notes (Signed)
Physical Therapy Session Note  Patient Details  Name: Justin Rose MRN: [id number] Date of Birth: [birth year]  Today's Date: 10/19/2020 PT Individual Time: 1700-1749 PT Individual Time Calculation (min): 49 min   Short Term Goals: Week 1:  PT Short Term Goal 1 (Week 1): pt to demonstrate supine<>sit CGA PT Short Term Goal 1 - Progress (Week 1): Met PT Short Term Goal 2 (Week 1): pt to demonstrate functional transfers CGA with LRAD PT Short Term Goal 2 - Progress (Week 1): Met PT Short Term Goal 3 (Week 1): pt to demonstrate gait training with LRAD for 100' CGA PT Short Term Goal 3 - Progress (Week 1): Met PT Short Term Goal 4 (Week 1): pt to improve ROM to R knee flexion to 60 degrees PT Short Term Goal 4 - Progress (Week 1): Met Week 2:  PT Short Term Goal 1 (Week 2): LTG=STG due to ELOS  Skilled Therapeutic Interventions/Progress Updates:    pt received in bed and agreeable to therapy. Pt reported he has completed in HEP in full one round this AM prior to this session of PT and reports he thinks his knee is "moving a lot better and getting more mobility in it." pt pleased with progress. Pt requested to use restroom before leaving room, pt directed in supine>sit supervision, sat EOB supervision, PT assisted in doffing socks and donning B TED hose total A for time and pt able to don shoes in sitting CGA. Pt directed in Sit to stand and gait with Rolling walker to restroom CGA, stood to urinate at urinal. Pt directed in gait to gym 150' with Rolling walker with VC for gait pattern on RLE and improved head carriage with good effect. Pt directed in active stretching for knee flexion in standing at first step with BUE support at rails with pt instructed to step onto first step, slightly lean into knee flexion and complete 10s isometrics x10 for improved knee mobility. Pt directed in ascending/descending 4 stairs with one handrail, limited trunk lean noted with VC for increased knee flexion with ascending  stairs, x3 sets completed at [place]. Pt directed in gait with Rolling walker CGA with noted improved heel toe pattern and trunk extension, VC for decreased R LE external rotation, pt able to correct with cues and maintain, total of 225'. Pt returned to room requested to sit in standard chair for improved comfort compared to bed or recliner, PT setup room for this, pt rested in standing at RW in good condition. Pt directed into standard arm chair, left in sitting, alarm set, All needs in reach and in good condition. Call light in hand.  RLE elevated for pt's comfort without pillow behind knee. Pt reported soreness in R knee but denied pain throughout.   Therapy Documentation Precautions:  Precautions Precautions: Fall Precaution Comments: Right KI Required Braces or Orthoses: Knee Immobilizer - Right Restrictions Weight Bearing Restrictions: No RLE Weight Bearing: Weight bearing as tolerated General:   Vital Signs:   Pain:   Mobility:   Locomotion :    Trunk/Postural Assessment :    Balance:   Exercises:   Other Treatments:      Therapy/Group: Individual Therapy  Junie Panning 10/19/2020, 10:41 AM

## 2020-10-20 DIAGNOSIS — I82441 Acute embolism and thrombosis of right tibial vein: Secondary | ICD-10-CM | POA: Diagnosis not present

## 2020-10-20 DIAGNOSIS — D62 Acute posthemorrhagic anemia: Secondary | ICD-10-CM | POA: Diagnosis not present

## 2020-10-20 DIAGNOSIS — M1711 Unilateral primary osteoarthritis, right knee: Secondary | ICD-10-CM | POA: Diagnosis not present

## 2020-10-20 DIAGNOSIS — E871 Hypo-osmolality and hyponatremia: Secondary | ICD-10-CM | POA: Diagnosis not present

## 2020-10-20 NOTE — Plan of Care (Signed)
  Problem: Consults Goal: RH STROKE PATIENT EDUCATION Description: See Patient Education module for education specifics  Outcome: Progressing   Problem: RH SKIN INTEGRITY Goal: RH STG SKIN FREE OF INFECTION/BREAKDOWN Description: Assess skin q shift and as needed Outcome: Progressing   Problem: RH SAFETY Goal: RH STG ADHERE TO SAFETY PRECAUTIONS W/ASSISTANCE/DEVICE Description: STG Adhere to Safety Precautions With Mod I Assistance/Device. Outcome: Progressing Goal: RH STG DECREASED RISK OF FALL WITH ASSISTANCE Description: STG Decreased Risk of Fall With Mod I Assistance. Outcome: Progressing

## 2020-10-20 NOTE — Progress Notes (Signed)
[facility] PHYSICAL MEDICINE & REHABILITATION PROGRESS NOTE   Subjective/Complaints:  No new complaints. Right knee still swollen but working hard through it. Pain controlled  ROS: Patient denies fever, rash, sore throat, blurred vision, nausea, vomiting, diarrhea, cough, shortness of breath or chest pain,  headache, or mood change.    Objective:   No results found. No results for input(s): WBC, HGB, HCT, PLT in the last 72 hours. No results for input(s): NA, K, CL, CO2, GLUCOSE, BUN, CREATININE, CALCIUM in the last 72 hours.  Intake/Output Summary (Last 24 hours) at 10/20/2020 0820 Last data filed at 10/20/2020 0502 Gross per 24 hour  Intake 720 ml  Output 435 ml  Net 285 ml        Physical Exam: Vital Signs Blood pressure 121/80, pulse 74, temperature 98.3 F (36.8 C), temperature source Oral, resp. rate 18, height 5\' 11"  (1.803 m), weight 81.3 kg, SpO2 98 %. Constitutional: No distress . Vital signs reviewed. HEENT: EOMI, oral membranes moist Neck: supple Cardiovascular: RRR without murmur. No JVD    Respiratory/Chest: CTA Bilaterally without wheezes or rales. Normal effort    GI/Abdomen: BS +, non-tender, non-distended Ext: no clubbing, cyanosis, or edema Psych: pleasant and cooperative Skin: knee wound CDI. k-tape, bruising around knee still present Psych: appropriate Musc: Right knee edema sl improved. 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 Normal insight and awareness. Intact Memory. Normal language and speech. Cranial nerve exam unremarkable  Motor: B/l UE: 5/5 proximal to distal RLE: HF 4-/5, KE 3/5 ADF 4/5 (some pain inhibition), unchanged LLE: HF, KE 4/5, ADF 4/5   Assessment/Plan: 1. Functional deficits which require 3+ hours per day of interdisciplinary therapy in a comprehensive inpatient rehab setting.  Physiatrist is providing close team supervision and  24 hour management of active medical problems listed below.  Physiatrist and rehab team continue to assess barriers to discharge/monitor patient progress toward functional and medical goals  Care Tool:  Bathing    Body parts bathed by patient: Right arm, Left arm, Chest, Abdomen, Front perineal area, Buttocks, Right upper leg, Left upper leg, Face, Left lower leg, Right lower leg   Body parts bathed by helper: Left lower leg, Buttocks Body parts n/a: Right lower leg (in KI in standing, did not wash)   Bathing assist Assist Level: Supervision/Verbal cueing (standing with grab bar)     Upper Body Dressing/Undressing Upper body dressing   What is the patient wearing?: Pull over shirt    Upper body assist Assist Level: Supervision/Verbal cueing (standing doffing/sitting donning-set up)    Lower Body Dressing/Undressing Lower body dressing      What is the patient wearing?: Pants     Lower body assist Assist for lower body dressing: Supervision/Verbal cueing     Toileting Toileting    Toileting assist Assist for toileting: Contact Guard/Touching assist     Transfers Chair/bed transfer  Transfers assist     Chair/bed transfer assist level: Supervision/Verbal cueing     Locomotion Ambulation   Ambulation assist      Assist level: Supervision/Verbal cueing Assistive device: Walker-rolling Max distance: 150   Walk 10 feet activity   Assist     Assist level: Supervision/Verbal cueing Assistive device: Walker-rolling   Walk 50 feet activity   Assist    Assist level: Supervision/Verbal cueing Assistive device: Walker-rolling    Walk 150 feet activity   Assist Walk 150  feet activity did not occur: Safety/medical concerns  Assist level: Supervision/Verbal cueing Assistive device: Walker-rolling    Walk 10 feet on uneven surface  activity   Assist Walk 10 feet on uneven surfaces activity did not occur: Safety/medical concerns   Assist  level: Supervision/Verbal cueing Assistive device: Aeronautical engineer Will patient use wheelchair at discharge?: No      Wheelchair assist level: Minimal Assistance - Patient > 75% Max wheelchair distance: 130    Wheelchair 50 feet with 2 turns activity    Assist        Assist Level: Minimal Assistance - Patient > 75%   Wheelchair 150 feet activity     Assist      Assist Level: Minimal Assistance - Patient > 75%   Blood pressure 121/80, pulse 74, temperature 98.3 F (36.8 C), temperature source Oral, resp. rate 18, height 5\' 11"  (1.803 m), weight 81.3 kg, SpO2 98 %.  Medical Problem List and Plan: 1.  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  Continue CIR  DC CPM-discussed with patient and nursing.  12/1- daily dressing changes written for- took off surgical dressing  12/4-5- reviewed knee ROM exercises with patient   -ongoing swelling/bruising which are making it difficult 2.  Antithrombotics: -DVT/anticoagulation: Xarelto ordered.   Venous Dopplers showing right saphenous vein junction, posterior tib, peroneal vein DVT             -antiplatelet therapy: ASA/Plavix DC'd, discussed with neurology, given DOAC. 3. Pain Management: Oxycodone, tramadol and/or robaxin prn.   Controlled with meds on 11/28  11/30- pain from arthritis- cannot start meloxicam, unfortunately due to being on Xarelto- explained to pt- will need to con't tramadol prn  12/5- con't tramadol prn- pain contorlled  Monitor with increased exertion 4. Mood: LCSW to follow for evaluation and support.              -antipsychotic agents: N/A 5. Neuropsych: This patient is capable of making decisions on his own behalf. 6. Skin/Wound Care: Monitor incision for healing.  Added protein supplement to help promote wound healing.  7. Fluids/Electrolytes/Nutrition: Monitor I/Os.  8. Dizziness: Monitor  orthostatic vitals. Encourage fluid intake.  Orthostatics borderline positive on 11/28  Compression stockings/abdominal binder as necessary  12/3- doing well- no Sx's- con't regimen 9. 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 Drug induced constipation:   Bowel meds resumed.  Will consider further increase tomorrow if no bowel movement today.  11/30- will give dose of Sorbitol at 3pm- also add another senokot prn  12/1- no BM- will give milk of Magnesium 34ml  12/2- will decrease iron to daily and increase senokot to 2 tabs daily-   12/5 moved bowels 12/3--feels that he needs to empty today 12. Proteus UTI:  IV Rocephin X 3 days-->Keflex course completed on 11/27.  Repeat UA normal  Reviewed urine culture with multiple species 13. Urinary retention: Continue Flomax.    Overall  improving 14.  Hyponatremia  Sodium 134 on 11/25, labs ordered for tomorrow  11/30- Na up to 137 15.  Leukocytosis  WBCs 12.4 on 11/25, labs ordered for tomorrow  11/29- WBC 8.6- much improved  See #12 16.?  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  Lysine at home daily.  Encouraged patient to ask his wife to bring for evaluation  11/30- can take lysine daily- have wife bring in 53.  Sleep disturbance  Benadryl 25 ordered per patient request as this is his medication PTA.  12/2- sleeping much better- con't regimen  LOS: 11 days A FACE TO FACE EVALUATION WAS PERFORMED  Meredith Staggers 10/20/2020, 8:20 AM

## 2020-10-21 ENCOUNTER — Inpatient Hospital Stay (HOSPITAL_COMMUNITY): Payer: Medicare HMO

## 2020-10-21 ENCOUNTER — Inpatient Hospital Stay (HOSPITAL_COMMUNITY): Payer: Medicare HMO | Admitting: Occupational Therapy

## 2020-10-21 ENCOUNTER — Ambulatory Visit (HOSPITAL_COMMUNITY): Payer: Medicare HMO

## 2020-10-21 ENCOUNTER — Encounter (HOSPITAL_COMMUNITY): Payer: Medicare HMO | Admitting: Occupational Therapy

## 2020-10-21 DIAGNOSIS — M1711 Unilateral primary osteoarthritis, right knee: Secondary | ICD-10-CM | POA: Diagnosis not present

## 2020-10-21 LAB — CBC
HCT: 33.2 % — ABNORMAL LOW (ref 39.0–52.0)
Hemoglobin: 10.6 g/dL — ABNORMAL LOW (ref 13.0–17.0)
MCH: 30.8 pg (ref 26.0–34.0)
MCHC: 31.9 g/dL (ref 30.0–36.0)
MCV: 96.5 fL (ref 80.0–100.0)
Platelets: 373 10*3/uL (ref 150–400)
RBC: 3.44 MIL/uL — ABNORMAL LOW (ref 4.22–5.81)
RDW: 14.3 % (ref 11.5–15.5)
WBC: 6.3 10*3/uL (ref 4.0–10.5)
nRBC: 0 % (ref 0.0–0.2)

## 2020-10-21 NOTE — Progress Notes (Signed)
[facility] PHYSICAL MEDICINE & REHABILITATION PROGRESS NOTE   Subjective/Complaints:  Pt reports going home tomorrow- only issue is that R knee just only bending ~ 30 degrees- not 90 degrees as expected.   Family training today/this AM- wife coming in.   ROS:  Pt denies SOB, abd pain, CP, N/V/C/D, and vision changes    Objective:   No results found. Recent Labs    10/21/20 0537  WBC 6.3  HGB 10.6*  HCT 33.2*  PLT 373   No results for input(s): NA, K, CL, CO2, GLUCOSE, BUN, CREATININE, CALCIUM in the last 72 hours.  Intake/Output Summary (Last 24 hours) at 10/21/2020 1315 Last data filed at 10/21/2020 0841 Gross per 24 hour  Intake 240 ml  Output 1925 ml  Net -1685 ml        Physical Exam: Vital Signs Blood pressure 100/87, pulse 100, temperature 98.8 F (37.1 C), temperature source Oral, resp. rate 18, height 5\' 11"  (1.803 m), weight 81.3 kg, SpO2 100 %. Constitutional: awake, alert, appropriate, sitting up in bed; appropriate, NAD HEENT: EOMI, oral membranes moist Neck: supple Cardiovascular: RRR no JVD    Respiratory/Chest: CTA B/L- no W/R/R- good air movement   GI/Abdomen: Soft, NT, ND, (+)BS  Ext: no clubbing, cyanosis, or edema Psych: pleasant and cooperative Skin: knee wound CDI. 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 Normal insight and awareness. Intact Memory. Normal language and speech. Cranial nerve exam unremarkable  Motor: B/l UE: 5/5 proximal to distal RLE: HF 4-/5, KE 3/5 ADF 4/5 (some pain inhibition), unchanged LLE: HF, KE 4/5, ADF 4/5   Assessment/Plan: 1. Functional deficits which require 3+ hours per day of interdisciplinary therapy in a comprehensive inpatient rehab setting.  Physiatrist is  providing close team supervision and 24 hour management of active medical problems listed below.  Physiatrist and rehab team continue to assess barriers to discharge/monitor patient progress toward functional and medical goals  Care Tool:  Bathing    Body parts bathed by patient: Right arm, Left arm, Chest, Abdomen, Front perineal area, Buttocks, Right upper leg, Left upper leg, Face, Left lower leg, Right lower leg   Body parts bathed by helper: Left lower leg, Buttocks Body parts n/a: Right lower leg (in KI in standing, did not wash)   Bathing assist Assist Level: Supervision/Verbal cueing (standing with grab bar)     Upper Body Dressing/Undressing Upper body dressing   What is the patient wearing?: Pull over shirt    Upper body assist Assist Level: Supervision/Verbal cueing (standing)    Lower Body Dressing/Undressing Lower body dressing      What is the patient wearing?: Pants     Lower body assist Assist for lower body dressing: Supervision/Verbal cueing     Toileting Toileting    Toileting assist Assist for toileting: Supervision/Verbal cueing     Transfers Chair/bed transfer  Transfers assist     Chair/bed transfer assist level: Supervision/Verbal cueing     Locomotion Ambulation   Ambulation assist      Assist level: Supervision/Verbal cueing Assistive device: Walker-rolling Max distance: 150   Walk 10 feet activity   Assist     Assist level: Supervision/Verbal cueing Assistive device: Walker-rolling   Walk 50 feet activity   Assist  Assist level: Supervision/Verbal cueing Assistive device: Walker-rolling    Walk 150 feet activity   Assist Walk 150 feet activity did not occur: Safety/medical concerns  Assist level: Supervision/Verbal cueing Assistive device: Walker-rolling    Walk 10 feet on uneven surface  activity   Assist Walk 10 feet on uneven surfaces activity did not occur: Safety/medical concerns   Assist  level: Supervision/Verbal cueing Assistive device: Aeronautical engineer Will patient use wheelchair at discharge?: No      Wheelchair assist level: Minimal Assistance - Patient > 75% Max wheelchair distance: 130    Wheelchair 50 feet with 2 turns activity    Assist        Assist Level: Minimal Assistance - Patient > 75%   Wheelchair 150 feet activity     Assist      Assist Level: Minimal Assistance - Patient > 75%   Blood pressure 100/87, pulse 100, temperature 98.8 F (37.1 C), temperature source Oral, resp. rate 18, height 5\' 11"  (1.803 m), weight 81.3 kg, SpO2 100 %.  Medical Problem List and Plan: 1.  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  Continue CIR  DC CPM-discussed with patient and nursing.  12/1- daily dressing changes written for- took off surgical dressing  12/4-5- reviewed knee ROM exercises with patient   -ongoing swelling/bruising which are making it difficult  12/6- d/c tomorrow- will need f/u with Dr Dagoberto Ligas- can do Transitional care with Zella Ball, NP 2.  Antithrombotics: -DVT/anticoagulation: Xarelto ordered.   Venous Dopplers showing right saphenous vein junction, posterior tib, peroneal vein DVT             -antiplatelet therapy: ASA/Plavix DC'd, discussed with neurology, given DOAC. 3. Pain Management: Oxycodone, tramadol and/or robaxin prn.   Controlled with meds on 11/28  11/30- pain from arthritis- cannot start meloxicam, unfortunately due to being on Xarelto- explained to pt- will need to con't tramadol prn  12/6- pain controlled with tramadol prn- con't  Monitor with increased exertion 4. Mood: LCSW to follow for evaluation and support.              -antipsychotic agents: N/A 5. Neuropsych: This patient is capable of making decisions on his own behalf. 6. Skin/Wound Care: Monitor incision for healing.  Added protein supplement to help  promote wound healing.  7. Fluids/Electrolytes/Nutrition: Monitor I/Os.  8. Dizziness: Monitor orthostatic vitals. Encourage fluid intake.  Orthostatics borderline positive on 11/28  Compression stockings/abdominal binder as necessary  12/3- doing well- no Sx's- con't regimen 9. 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 Drug induced constipation:   Bowel meds resumed.  Will consider further increase tomorrow if no bowel movement today.  11/30- will give dose of Sorbitol at 3pm- also add another senokot prn  12/1- no BM- will give milk of Magnesium 61ml  12/2- will decrease iron to daily and increase senokot to 2 tabs daily-   12/5 moved bowels 12/3--feels that he  needs to empty today 12. Proteus UTI:  IV Rocephin X 3 days-->Keflex course completed on 11/27.  Repeat UA normal  Reviewed urine culture with multiple species 13. Urinary retention: Continue Flomax.    Overall improving 14.  Hyponatremia  Sodium 134 on 11/25, labs ordered for tomorrow  11/30- Na up to 137 15.  Leukocytosis  WBCs 12.4 on 11/25, labs ordered for tomorrow  11/29- WBC 8.6- much improved  See #12 16.?  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  Lysine at home daily.  Encouraged patient to ask his wife to bring for evaluation  11/30- can take lysine daily- have wife bring in 55.  Sleep disturbance  Benadryl 25 ordered per patient request as this is his medication PTA.  12/2- sleeping much better- con't regimen  LOS: 12 days A FACE TO FACE EVALUATION WAS PERFORMED  Lavaya Defreitas 10/21/2020, 1:15 PM

## 2020-10-21 NOTE — Progress Notes (Signed)
Patient ID: Justin Rose, male   DOB: Nov 27, 1933, 86 y.o.   MRN: [id number]   Patient declined by [facility] due to only accepting their patients currently.   [place], [place]

## 2020-10-21 NOTE — Progress Notes (Signed)
Patient ID: Justin Rose, male   DOB: 1934-05-29, 86 y.o.   MRN: [id number]   Patient set up with [facility] due to services recommended at discharge and area.  [place], [place]

## 2020-10-21 NOTE — Discharge Summary (Signed)
Physician Discharge Summary  Patient ID: Justin Rose MRN: [id number] DOB/AGE: 12-26-1933 86 y.o.  Admit date: 10/09/2020 Discharge date: 10/23/2020  Discharge Diagnoses:  Principal Problem:   OA (osteoarthritis) of knee Active Problems:   Total knee replacement status   Myositis   Postoperative pain   Leukocytosis   Hyponatremia   Acute deep vein thrombosis (DVT) of tibial vein of right lower extremity (HCC)   Discharged Condition: stable   Significant Diagnostic Studies: CT ANGIO HEAD W OR WO CONTRAST  Result Date: 10/01/2020 CLINICAL DATA:  Acute neuro deficit.  Stroke. EXAM: CT ANGIOGRAPHY HEAD AND NECK TECHNIQUE: Multidetector CT imaging of the head and neck was performed using the standard protocol during bolus administration of intravenous contrast. Multiplanar CT image reconstructions and MIPs were obtained to evaluate the vascular anatomy. Carotid stenosis measurements (when applicable) are obtained utilizing NASCET criteria, using the distal internal carotid diameter as the denominator. CONTRAST:  118mL OMNIPAQUE IOHEXOL 350 MG/ML SOLN COMPARISON:  CT head and MRI head 10/01/2020 FINDINGS: CTA NECK FINDINGS Aortic arch: Standard branching. Imaged portion shows no evidence of aneurysm or dissection. No significant stenosis of the major arch vessel origins. Right carotid system: Atherosclerotic calcification right carotid bulb without significant stenosis. Left carotid system: Atherosclerotic calcification left carotid bulb without significant stenosis. Vertebral arteries: Both vertebral arteries patent to the basilar without stenosis. Skeleton: Cervical spondylosis.  No acute skeletal abnormality. Other neck: 10 mm left thyroid nodule. No further imaging necessary. No enlarged lymph nodes in the neck. Upper chest: Lung apices clear bilaterally. Review of the MIP images confirms the above findings CTA HEAD FINDINGS Anterior circulation: Mild atherosclerotic calcification in the cavernous  carotid bilaterally without stenosis. Anterior and middle cerebral arteries patent bilaterally without significant stenosis or large vessel occlusion. Posterior circulation: Both vertebral arteries patent to the basilar. Left PICA patent. Right PICA not visualized. Basilar widely patent. AICA, superior cerebellar, and posterior cerebral arteries patent bilaterally without stenosis or large vessel occlusion. The Venous sinuses: Normal venous enhancement Anatomic variants: None Review of the MIP images confirms the above findings IMPRESSION: 1. No significant carotid or vertebral artery stenosis in the neck 2. Negative for intracranial large vessel occlusion or flow limiting stenosis 3. Findings support cerebral emboli based on diffusion-weighted imaging. Electronically Signed   By: Franchot Gallo M.D.   On: 10/01/2020 11:02   CT HEAD WO CONTRAST  Result Date: 10/01/2020 CLINICAL DATA:  Headache and encephalopathy EXAM: CT HEAD WITHOUT CONTRAST TECHNIQUE: Contiguous axial images were obtained from the base of the skull through the vertex without intravenous contrast. COMPARISON:  None. FINDINGS: Brain: There is no mass, hemorrhage or extra-axial collection. There is generalized atrophy without lobar predilection. Hypodensity of the white matter is most commonly associated with chronic microvascular disease. Vascular: No abnormal hyperdensity of the major intracranial arteries or dural venous sinuses. No intracranial atherosclerosis. Skull: The visualized skull base, calvarium and extracranial soft tissues are normal. Sinuses/Orbits: No fluid levels or advanced mucosal thickening of the visualized paranasal sinuses. No mastoid or middle ear effusion. The orbits are normal. Examination mildly degraded by motion IMPRESSION: Generalized atrophy and chronic microvascular ischemia without acute intracranial abnormality. Electronically Signed   By: Ulyses Jarred M.D.   On: 10/01/2020 02:06   CT ANGIO NECK W OR WO  CONTRAST  Result Date: 10/01/2020 CLINICAL DATA:  Acute neuro deficit.  Stroke. EXAM: CT ANGIOGRAPHY HEAD AND NECK TECHNIQUE: Multidetector CT imaging of the head and neck was performed using the standard protocol during bolus administration  of intravenous contrast. Multiplanar CT image reconstructions and MIPs were obtained to evaluate the vascular anatomy. Carotid stenosis measurements (when applicable) are obtained utilizing NASCET criteria, using the distal internal carotid diameter as the denominator. CONTRAST:  145mL OMNIPAQUE IOHEXOL 350 MG/ML SOLN COMPARISON:  CT head and MRI head 10/01/2020 FINDINGS: CTA NECK FINDINGS Aortic arch: Standard branching. Imaged portion shows no evidence of aneurysm or dissection. No significant stenosis of the major arch vessel origins. Right carotid system: Atherosclerotic calcification right carotid bulb without significant stenosis. Left carotid system: Atherosclerotic calcification left carotid bulb without significant stenosis. Vertebral arteries: Both vertebral arteries patent to the basilar without stenosis. Skeleton: Cervical spondylosis.  No acute skeletal abnormality. Other neck: 10 mm left thyroid nodule. No further imaging necessary. No enlarged lymph nodes in the neck. Upper chest: Lung apices clear bilaterally. Review of the MIP images confirms the above findings CTA HEAD FINDINGS Anterior circulation: Mild atherosclerotic calcification in the cavernous carotid bilaterally without stenosis. Anterior and middle cerebral arteries patent bilaterally without significant stenosis or large vessel occlusion. Posterior circulation: Both vertebral arteries patent to the basilar. Left PICA patent. Right PICA not visualized. Basilar widely patent. AICA, superior cerebellar, and posterior cerebral arteries patent bilaterally without stenosis or large vessel occlusion. The Venous sinuses: Normal venous enhancement Anatomic variants: None Review of the MIP images confirms  the above findings IMPRESSION: 1. No significant carotid or vertebral artery stenosis in the neck 2. Negative for intracranial large vessel occlusion or flow limiting stenosis 3. Findings support cerebral emboli based on diffusion-weighted imaging. Electronically Signed   By: Franchot Gallo M.D.   On: 10/01/2020 11:02   MR BRAIN WO CONTRAST  Addendum Date: 10/01/2020   ADDENDUM REPORT: 10/01/2020 07:48 ADDENDUM: These results will be called to the ordering clinician or representative by the Radiologist Assistant, and communication documented in the PACS or [employer]. Electronically Signed   By: Kellie Simmering DO   On: 10/01/2020 07:48   Result Date: 10/01/2020 CLINICAL DATA:  Neuro deficit, acute, stroke suspected. Additional history provided: Status post total knee replacement with sudden onset of intermittent confusion, headaches, vision changes, evaluate for possible CVA versus bleed. EXAM: MRI HEAD WITHOUT CONTRAST TECHNIQUE: Multiplanar, multiecho pulse sequences of the brain and surrounding structures were obtained without intravenous contrast. COMPARISON:  Noncontrast head CT performed earlier the same day 10/01/2020. FINDINGS: Brain: Mild generalized cerebral atrophy. There are numerous small scattered acute cortical and subcortical infarcts within the bilateral cerebral hemispheres, affecting the frontal, parietal and occipital lobes. Moderate multifocal T2/FLAIR hyperintensity within the cerebral white matter is nonspecific, but compatible with chronic small vessel ischemic disease. No evidence of intracranial mass. No chronic intracranial blood products. No extra-axial fluid collection. No midline shift. Vascular: Expected proximal arterial flow voids. Skull and upper cervical spine: No focal marrow lesion. Sinuses/Orbits: Visualized orbits show no acute finding. Mild ethmoid sinus mucosal thickening. IMPRESSION: Numerous small scattered acute cortical and subcortical infarcts within the  bilateral frontal, parietal and occipital lobes. Findings are likely secondary to an embolic process. Mild cerebral atrophy and moderate chronic small vessel ischemic disease. Mild ethmoid sinus mucosal thickening. Electronically Signed: By: Kellie Simmering DO On: 10/01/2020 07:40   US RENAL  Result Date: 10/05/2020 CLINICAL DATA:  86 year old male with urinary retention. EXAM: RENAL / URINARY TRACT ULTRASOUND COMPLETE COMPARISON:  None. FINDINGS: Right Kidney: Renal measurements: 11.5 x 4.9 x 4.5 cm = volume: 130 mL. Cortical thinning is present. Echogenicity within normal limits. No mass or hydronephrosis visualized. Left Kidney:  Renal measurements: 11.6 x 5.5 x 4.8 cm = volume: 161 mL. Cortical thinning is noted. Echogenicity within normal limits. No mass or hydronephrosis visualized. Bladder: A small amount of debris is noted within the bladder. No other bladder abnormalities are noted. Other: None. IMPRESSION: 1. Small amount of debris within the bladder. Correlate with possible infection. 2. Bilateral renal cortical thinning. 3. No evidence of hydronephrosis. Electronically Signed   By: Margarette Canada M.D.   On: 10/05/2020 08:56   ECHOCARDIOGRAM COMPLETE  Result Date: 10/01/2020    ECHOCARDIOGRAM REPORT   Patient Name:   Ayo Coultas  Date of Exam: 10/01/2020 Medical Rec #:  [id number]  Height:       71.0 in Accession #:    [id number] Weight:       188.6 lb Date of Birth:  Jan 16, 1934  BSA:          2.057 m Patient Age:    42 years   BP:           126/83 mmHg Patient Gender: M          HR:           70 bpm. Exam Location:  Inpatient Procedure: 2D Echo, Color Doppler and Cardiac Doppler Indications:    Stroke i163.9  History:        Patient has no prior history of Echocardiogram examinations.  Sonographer:    Raquel Sarna Senior RDCS Referring Phys: [id number] [facility]  Sonographer Comments: Technically difficult study due to poor echo windows. Scanned supine; 1 day post TKA IMPRESSIONS  1. Left ventricular ejection  fraction, by estimation, is 60 to 65%. The left ventricle has normal function. Left ventricular endocardial border not optimally defined to evaluate regional wall motion. Left ventricular diastolic parameters are consistent with Grade I diastolic dysfunction (impaired relaxation).  2. Right ventricular systolic function is mildly reduced. The right ventricular size is mildly enlarged. There is normal pulmonary artery systolic pressure. The estimated right ventricular systolic pressure is 62.9 mmHg.  3. The mitral valve is grossly normal. No evidence of mitral valve regurgitation. No evidence of mitral stenosis.  4. The aortic valve is abnormal. There is mild calcification of the aortic valve. Aortic valve regurgitation is not visualized. No aortic valve stenosis noted on this exam, however gradient is likely underestimated due to suboptimal Doppler alignment.  5. The inferior vena cava is dilated in size with >50% respiratory variability, suggesting right atrial pressure of 8 mmHg. Conclusion(s)/Recommendation(s): No intracardiac source of embolism detected on this transthoracic study. Image quality limits sensitivity to detect small lesions. FINDINGS  Left Ventricle: Left ventricular ejection fraction, by estimation, is 60 to 65%. The left ventricle has normal function. Left ventricular endocardial border not optimally defined to evaluate regional wall motion. The left ventricular internal cavity size was normal in size. There is no left ventricular hypertrophy. Left ventricular diastolic parameters are consistent with Grade I diastolic dysfunction (impaired relaxation). Right Ventricle: The right ventricular size is mildly enlarged. No increase in right ventricular wall thickness. Right ventricular systolic function is mildly reduced. There is normal pulmonary artery systolic pressure. The tricuspid regurgitant velocity  is 2.31 m/s, and with an assumed right atrial pressure of 8 mmHg, the estimated right  ventricular systolic pressure is 52.8 mmHg. Left Atrium: Left atrial size was normal in size. Right Atrium: Right atrial size was normal in size. Pericardium: There is no evidence of pericardial effusion. Mitral Valve: The mitral valve is grossly normal. No evidence of mitral valve  regurgitation. No evidence of mitral valve stenosis. Tricuspid Valve: The tricuspid valve is normal in structure. Tricuspid valve regurgitation is not demonstrated. No evidence of tricuspid stenosis. Aortic Valve: The aortic valve is abnormal. There is moderate calcification of the aortic valve. Aortic valve regurgitation is not visualized. Pulmonic Valve: The pulmonic valve was not well visualized. Pulmonic valve regurgitation is not visualized. No evidence of pulmonic stenosis. Aorta: The aortic root is normal in size and structure. Venous: The inferior vena cava is dilated in size with greater than 50% respiratory variability, suggesting right atrial pressure of 8 mmHg. IAS/Shunts: No atrial level shunt detected by color flow Doppler.  LEFT VENTRICLE PLAX 2D LVIDd:         4.30 cm  Diastology LVIDs:         2.86 cm  LV e' medial:    8.16 cm/s LV PW:         0.85 cm  LV E/e' medial:  7.9 LV IVS:        0.92 cm  LV e' lateral:   10.80 cm/s LVOT diam:     2.20 cm  LV E/e' lateral: 6.0 LV SV:         73 LV SV Index:   35 LVOT Area:     3.80 cm  RIGHT VENTRICLE RV S prime:     8.92 cm/s TAPSE (M-mode): 1.9 cm LEFT ATRIUM           Index       RIGHT ATRIUM           Index LA diam:      3.10 cm 1.51 cm/m  RA Area:     15.00 cm LA Vol (A4C): 49.2 ml 23.92 ml/m RA Volume:   39.60 ml  19.25 ml/m  AORTIC VALVE LVOT Vmax:   90.90 cm/s LVOT Vmean:  73.900 cm/s LVOT VTI:    0.192 m  AORTA Ao Root diam: 3.60 cm MITRAL VALVE               TRICUSPID VALVE MV Area (PHT): 2.73 cm    TR Peak grad:   21.3 mmHg MV Decel Time: 278 msec    TR Vmax:        231.00 cm/s MV E velocity: 64.50 cm/s MV A velocity: 76.20 cm/s  SHUNTS MV E/A ratio:  0.85         Systemic VTI:  0.19 m                            Systemic Diam: 2.20 cm Cherlynn Kaiser MD Electronically signed by Cherlynn Kaiser MD Signature Date/Time: 10/01/2020/3:34:11 PM    Final    VAS [place] LOWER EXTREMITY VENOUS (DVT)  Result Date: 10/14/2020  Lower Venous DVT Study Indications: Immobility, s/p knee surgery.  Risk Factors: Surgery 09-30-2020 RT total knee arthroplasty. Comparison       10-02-2020 Bilateral lower extremity venous was negative for Study:           DVT. Performing Technologist: Darlin Coco, RDMS  Examination Guidelines: A complete evaluation includes B-mode imaging, spectral Doppler, color Doppler, and power Doppler as needed of all accessible portions of each vessel. Bilateral testing is considered an integral part of a complete examination. Limited examinations for reoccurring indications may be performed as noted. The reflux portion of the exam is performed with the patient in reverse Trendelenburg.  +---------+---------------+---------+-----------+----------+--------------+ RIGHT    CompressibilityPhasicitySpontaneityPropertiesThrombus Aging +---------+---------------+---------+-----------+----------+--------------+ CFV  Full           Yes      Yes                                 +---------+---------------+---------+-----------+----------+--------------+ SFJ      Partial        Yes      Yes                  Acute          +---------+---------------+---------+-----------+----------+--------------+ FV Prox  Full                                                        +---------+---------------+---------+-----------+----------+--------------+ FV Mid   Full                                                        +---------+---------------+---------+-----------+----------+--------------+ FV DistalFull                                                        +---------+---------------+---------+-----------+----------+--------------+ PFV      Full                                                         +---------+---------------+---------+-----------+----------+--------------+ POP      Full           Yes      Yes                                 +---------+---------------+---------+-----------+----------+--------------+ PTV      Partial        Yes      Yes                  Acute          +---------+---------------+---------+-----------+----------+--------------+ PERO     Partial        Yes      Yes                  Acute          +---------+---------------+---------+-----------+----------+--------------+   +---------+---------------+---------+-----------+----------+--------------+ LEFT     CompressibilityPhasicitySpontaneityPropertiesThrombus Aging +---------+---------------+---------+-----------+----------+--------------+ CFV      Full           Yes      Yes                                 +---------+---------------+---------+-----------+----------+--------------+ SFJ      Full                                                        +---------+---------------+---------+-----------+----------+--------------+  FV Prox  Full                                                        +---------+---------------+---------+-----------+----------+--------------+ FV Mid   Full                                                        +---------+---------------+---------+-----------+----------+--------------+ FV DistalFull                                                        +---------+---------------+---------+-----------+----------+--------------+ PFV      Full                                                        +---------+---------------+---------+-----------+----------+--------------+ POP      Full           Yes      Yes                                 +---------+---------------+---------+-----------+----------+--------------+ PTV      Full                                                         +---------+---------------+---------+-----------+----------+--------------+ PERO     Full                                                        +---------+---------------+---------+-----------+----------+--------------+     Summary: RIGHT: - Findings consistent with acute deep vein thrombosis involving the SF junction, right posterior tibial veins, and right peroneal veins. - No cystic structure found in the popliteal fossa.  LEFT: - There is no evidence of deep vein thrombosis in the lower extremity.  - No cystic structure found in the popliteal fossa.  *See table(s) above for measurements and observations. Electronically signed by Ruta Hinds MD on 10/14/2020 at 3:59:57 PM.    Final    VAS [place] LOWER EXTREMITY VENOUS (DVT)  Result Date: 10/02/2020  Lower Venous DVT Study Indications: Embolic stroke s/p right knee surgery.  Limitations: Orthopaedic appliance. Comparison Study: no prior Performing Technologist: Abram Sander RVS  Examination Guidelines: A complete evaluation includes B-mode imaging, spectral Doppler, color Doppler, and power Doppler as needed of all accessible portions of each vessel. Bilateral testing is considered an integral part of a complete examination. Limited examinations for reoccurring indications may be performed as noted. The reflux portion of the exam is performed  with the patient in reverse Trendelenburg.  +---------+---------------+---------+-----------+----------+-------------------+ RIGHT    CompressibilityPhasicitySpontaneityPropertiesThrombus Aging      +---------+---------------+---------+-----------+----------+-------------------+ CFV      Full           Yes      Yes                                      +---------+---------------+---------+-----------+----------+-------------------+ SFJ      Full                                                             +---------+---------------+---------+-----------+----------+-------------------+  FV Prox  Full                                                             +---------+---------------+---------+-----------+----------+-------------------+ FV Mid   Full                                                             +---------+---------------+---------+-----------+----------+-------------------+ FV DistalFull                                                             +---------+---------------+---------+-----------+----------+-------------------+ PFV      Full                                                             +---------+---------------+---------+-----------+----------+-------------------+ POP                                                   Not well visualized +---------+---------------+---------+-----------+----------+-------------------+ PTV      Full                                                             +---------+---------------+---------+-----------+----------+-------------------+ PERO     Full                                                             +---------+---------------+---------+-----------+----------+-------------------+   +---------+---------------+---------+-----------+----------+--------------+  LEFT     CompressibilityPhasicitySpontaneityPropertiesThrombus Aging +---------+---------------+---------+-----------+----------+--------------+ CFV      Full           Yes      Yes                                 +---------+---------------+---------+-----------+----------+--------------+ SFJ      Full                                                        +---------+---------------+---------+-----------+----------+--------------+ FV Prox  Full                                                        +---------+---------------+---------+-----------+----------+--------------+ FV Mid   Full                                                         +---------+---------------+---------+-----------+----------+--------------+ FV DistalFull                                                        +---------+---------------+---------+-----------+----------+--------------+ PFV      Full                                                        +---------+---------------+---------+-----------+----------+--------------+ POP      Full           Yes      Yes                                 +---------+---------------+---------+-----------+----------+--------------+ PTV      Full                                                        +---------+---------------+---------+-----------+----------+--------------+ PERO     Full                                                        +---------+---------------+---------+-----------+----------+--------------+     Summary: BILATERAL: - No evidence of deep vein thrombosis seen in the lower extremities, bilaterally. - No evidence of superficial venous thrombosis in the lower extremities, bilaterally. -   *See table(s) above for measurements and observations. Electronically signed by Harold Barban MD  on 10/02/2020 at 8:19:36 PM.    Final     Labs:  Basic Metabolic Panel: BMP Latest Ref Rng & Units 10/14/2020 10/10/2020 10/08/2020  Glucose 70 - 99 mg/dL 103(H) 137(H) 115(H)  BUN 8 - 23 mg/dL 15 13 17   Creatinine 0.61 - 1.24 mg/dL 0.79 0.74 0.70  Sodium 135 - 145 mmol/L 137 134(L) 135  Potassium 3.5 - 5.1 mmol/L 3.8 3.5 3.8  Chloride 98 - 111 mmol/L 103 100 104  CO2 22 - 32 mmol/L 26 23 24   Calcium 8.9 - 10.3 mg/dL 8.7(L) 8.7(L) 8.2(L)    CBC: CBC Latest Ref Rng & Units 10/21/2020 10/14/2020 10/10/2020  WBC 4.0 - 10.5 K/uL 6.3 8.6 12.4(H)  Hemoglobin 13.0 - 17.0 g/dL 10.6(L) 9.3(L) 9.8(L)  Hematocrit 39 - 52 % 33.2(L) 29.0(L) 30.6(L)  Platelets 150 - 400 K/uL 373 410(H) 405(H)    CBG: No results for input(s): GLUCAP in the last 168 hours.  Brief HPI:   Jeffie Spivack is a 86 y.o. male  with history of glaucoma, OA bilateral knees was admitted on 09/30/2020 for R-TKR by Dr. Dwaine Gale.  Postop had issues with dizziness as well as left elbow elbow numbness with visual deficits and floaters as well as transient speech difficulty.  CT head was negative for acute process.  Neurology was consulted and MRI brain done revealing numerous small scattered acute cortical and subcortical infarcts in bilateral frontal, parietal and occipital lobes which were felt to be embolic.  CTA head/neck was negative for LVO or significant stenosis.  BLE Dopplers were negative for DVT.  2D echo showed EF of 60 to 65%.    Dr. Erlinda Hong felt that stroke was embolic due to unknown source and recommended loop recorder however patient declined this.  Neurology recommended 30-day cardiac event monitor after discharge to rule out A fib. 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  CIR was recommended due to functional decline.Marland Kitchen   Hospital Course: Takumi Din was admitted to rehab 10/09/2020 for inpatient therapies to consist of PT and OT at least three hours five days a week. Past admission physiatrist, therapy team and rehab RN have worked together to provide customized collaborative inpatient rehab.  He was maintained on aspirin and Plavix initially.  Due to significant RLE edema and immobility, dopplers ordered to rule out DVT.  This was positive for acute  DVT involving superficial femoral junction, right posterior tibial and right peroneal veins.  Neurology was consulted for input and recommended DC of ASA/Plavix and he was transition to [place] on 11/28. Patient prefers to follow up with neurology at [facility] and was advised to have PCP refer him to provider for stroke follow up.  He was found to have issues with orthostatic changes therefore compression stocking and abdominal binders were used for  support.  He was encouraged to increase fluid intake and with improvement with activity tolerance symptoms have resolved.  Serial check of CBC showed that reactive leukocytosis has resolved and acute blood loss anemia slowly resolving.  Check of electrolytes shows transient hyponatremia has resolved.  Abnormal LFTs slowly improving and patient without complaints of myalgias. He has had issues with intermittent urinary retention therefore Flomax was added with bladder training ongoing.  He is currently voiding without difficulty.  Right knee incision C/D/I and edema is decreasing with use of kinesiotaping.  Range of motion has improved to 70  degrees with 3 degrees extension. Pain is reasonably controlled with as needed use of tramadol at nights.  He has made steady gains during his rehab stay and is currently at supervision level.  He will continue to receive outpatient PT and OT after discharge.   Rehab course: During patient's stay in rehab weekly team conferences were held to monitor patient's progress, set goals and discuss barriers to discharge. At admission, patient required min assist with mobility and mod assist with ADL tasks. He has had improvement in activity tolerance, balance, postural control as well as ability to compensate for deficits.  He is able to complete ADL tasks with supervision. He requires supervision for transfers and to ambulate 150' with RW.   Disposition: Home.  Diet: Regular.   Special Instructions: 1. Will need 30 day cardiac monitor for work up of embolic stroke. 2. Repeat CBC in 1-2 weeks to follow up on ABLA. 3. Will need referral to neurology/cardiology for stroke follow up.    Discharge Instructions    Ambulatory referral to Physical Medicine Rehab   Complete by: As directed    4 week follow up     Allergies as of 10/22/2020      Reactions   Lipitor [atorvastatin]    Myositis      Medication List    STOP taking these medications   aspirin 81 MG  chewable tablet   cephALEXin 500 MG capsule Commonly known as: KEFLEX   clopidogrel 75 MG tablet Commonly known as: PLAVIX   GLUCOSAMINE-CHONDROITIN PO   MAGNESIUM PO   ondansetron 4 MG tablet Commonly known as: ZOFRAN   oxyCODONE 5 MG immediate release tablet Commonly known as: Oxy IR/ROXICODONE     TAKE these medications   brimonidine 0.2 % ophthalmic solution Commonly known as: ALPHAGAN Place 1 drop into the left eye at bedtime.   cetirizine 10 MG tablet Commonly known as: ZYRTEC Take 10 mg by mouth 2 (two) times daily.   cholecalciferol 25 MCG (1000 UNIT) tablet Commonly known as: VITAMIN D3 Take 1,000 Units by mouth daily.   Combigan 0.2-0.5 % ophthalmic solution Generic drug: brimonidine-timolol Place 1 drop into the left eye at bedtime.   ipratropium 0.06 % nasal spray Commonly known as: ATROVENT Place 2 sprays into both nostrils daily.   iron polysaccharides 150 MG capsule Commonly known as: NIFEREX Take 1 capsule (150 mg total) by mouth daily.   methocarbamol 500 MG tablet Commonly known as: ROBAXIN Take 1 tablet (500 mg total) by mouth every 6 (six) hours as needed for muscle spasms.   pantoprazole 40 MG tablet Commonly known as: PROTONIX Take 1 tablet (40 mg total) by mouth daily.   polyethylene glycol 17 g packet Commonly known as: MIRALAX / GLYCOLAX Take 17 g by mouth 2 (two) times daily.   Rivaroxaban 15 MG Tabs tablet Commonly known as: XARELTO Take 1 tablet (15 mg total) by mouth 2 (two) times daily with a meal. Notes to patient: This is your loading dose--last dose on 12/18 pm   rivaroxaban 20 MG Tabs tablet Commonly known as: XARELTO Take 1 tablet (20 mg total) by mouth daily with supper. Start taking on: November 03, 2020   senna-docusate 8.6-50 MG tablet Commonly known as: Senokot-S Take 2 tablets by mouth at bedtime. Notes to patient: FOR CONSTIPATION   tamsulosin 0.4 MG Caps capsule Commonly known as: FLOMAX Take 1 capsule  (0.4 mg total) by mouth daily.   traMADol 50 MG tablet--Rx # 14 pills Commonly known  as: ULTRAM Take 1-2 tablets (50-100 mg total) by mouth every 12 (twelve) hours as needed for severe pain. What changed:   when to take this  reasons to take this Notes to patient: DECREASE TO ONE PILL AS NEEDED   vitamin B-12 1000 MCG tablet Commonly known as: CYANOCOBALAMIN Take 1,000 mcg by mouth daily.       Follow-up Information    Lovorn, Jinny Blossom, MD Follow up.   Specialty: Physical Medicine and Rehabilitation Why: Office will call you with follow up appointment Contact information: [address] [place] [address] [phone]        Karlene Einstein, MD. Call in 1 day(s).   Specialty: Family Medicine Why: for post hospital follow up Contact information: [place] [address] [phone]        Gaynelle Arabian, MD. Call.   Specialty: Orthopedic Surgery Why: for post op appointment Contact information: [address] [place] [address] [phone]               Signed: Bary Leriche 10/23/2020, 10:10 AM

## 2020-10-21 NOTE — Progress Notes (Signed)
Physical Therapy Session Note  Patient Details  Name: Justin Rose MRN: [id number] Date of Birth: May 08, 1934  Today's Date: 10/21/2020 PT Individual Time: 9030-0923 PT Individual Time Calculation (min): 60 min   Short Term Goals: Week 2:  PT Short Term Goal 1 (Week 2): LTG=STG due to ELOS  Skilled Therapeutic Interventions/Progress Updates:    Patient in recliner with wife in the room.  Educated on frequency of HEP and patient S for stand step to bed with RW and sit to supine mod I  to perform quad set, ankle pumps, SAQ, SLR, hip abduction, heel slides supine and seated.  Educated not to perform AAROM using L LE until incision healed more.  Patient supine to sit mod I, sit to stand S and pt ambulated with wife supervision down hallway 120' to ortho gym.  Performed car transfer to simulated small SUV height with S and cues, increased time.  Patient ambulated 120' to therapy gym and performed stair negotiation with rail on L and cane with S min cues for step to sequence x 4 steps, then with wife assisting after education and demonstration x 4 steps as well.  Patient seated on mat sit to supine with S using wedge and replaced kinesiotape to R knee 2 "Y"s with off the paper tension for pain and edema control and then in L sidelying along R IT band with "I" strip for edema and pain relief.  Patient ambulated to room with wife S with RW x 150'.  Left seated in recliner with needs in reach and wife in the room.   Therapy Documentation Precautions:  Precautions Precautions: Fall Precaution Comments: Right KI Required Braces or Orthoses: Knee Immobilizer - Right Restrictions Weight Bearing Restrictions: No RLE Weight Bearing: Weight bearing as tolerated Pain: Pain Assessment Pain Scale: 0-10 Pain Score: 4  Faces Pain Scale: No hurt Pain Type: Acute pain Pain Location: Knee Pain Orientation: Right Pain Descriptors / Indicators: Sore Pain Frequency: Occasional Pain Onset: With Activity Patients  Stated Pain Goal: 0 Pain Intervention(s): Cutaneous stimulation (taping) Multiple Pain Sites: No    Therapy/Group: Individual Therapy  Reginia Naas  [place], PT 10/21/2020, 12:08 PM

## 2020-10-21 NOTE — Progress Notes (Signed)
Patient ID: Justin Rose, male   DOB: 08/25/1934, 86 y.o.   MRN: [id number]   Patient declined by [facility] due to only providing PT.  [place], [place]

## 2020-10-21 NOTE — Progress Notes (Signed)
Physical Therapy Discharge Summary  Patient Details  Name: Justin Rose MRN: [id number] Date of Birth: 03/14/1934  Today's Date: 10/21/2020   Patient has met 9 of 9 long term goals due to improved activity tolerance, improved balance, increased strength, increased range of motion and decreased pain.  Patient to discharge at an ambulatory level Supervision.   Patient's care partner is independent to provide the necessary cognitive assistance at discharge.  Reasons goals not met: Patient met all goals and his spouse was present for instruction on supervision level of assistance at d/c.  Recommendation:  Patient will benefit from ongoing skilled PT services in outpatient setting to continue to advance safe functional mobility, address ongoing impairments in knee ROM, balance, strength, and minimize fall risk.  Equipment: No equipment provided  Reasons for discharge: treatment goals met and discharge from hospital  Patient/family agrees with progress made and goals achieved: Yes  PT Discharge Precautions/Restrictions Precautions Precautions: Fall Restrictions Weight Bearing Restrictions: No Vital Signs   Pain Pain Assessment Pain Scale: 0-10 Pain Score: 2  Pain Type: Acute pain Pain Location: Knee Pain Orientation: Right Pain Descriptors / Indicators: Sore Pain Onset: With Activity Pain Intervention(s): Cutaneous stimulation (taping) Vision/Perception  Perception Perception: Within Functional Limits Praxis Praxis: Intact  Cognition Overall Cognitive Status: Within Functional Limits for tasks assessed Arousal/Alertness: Awake/alert Orientation Level: Oriented X4 Attention: Selective Selective Attention: Appears intact Memory: Appears intact Awareness: Appears intact Problem Solving: Impaired Safety/Judgment: Appears intact Sensation Sensation Light Touch: Appears Intact Coordination Gross Motor Movements are Fluid and Coordinated: Yes Fine Motor Movements are Fluid  and Coordinated: Yes Motor  Motor Motor: Within Functional Limits Motor - Discharge Observations: history of postural deficits due to lower back pain and kyphotic posture  Mobility Bed Mobility Bed Mobility: Supine to Sit;Sit to Supine;Rolling Left Rolling Left: Supervision/Verbal cueing Supine to Sit: Independent with assistive device Sit to Supine: Independent with assistive device Transfers Transfers: Sit to Stand Sit to Stand: Supervision/Verbal cueing Stand Pivot Transfers: Supervision/Verbal cueing Stand Pivot Transfer Details: Verbal cues for precautions/safety;Verbal cues for safe use of DME/AE Transfer (Assistive device): Rolling walker Locomotion  Gait Ambulation: Yes Gait Assistance: Supervision/Verbal cueing Gait Distance (Feet): 150 Feet Assistive device: Rolling walker Gait Assistance Details: cues for posture Gait Gait Pattern: Step-through pattern;Decreased stride length;Decreased hip/knee flexion - right;Trunk flexed Stairs / Additional Locomotion Stairs: Yes Stairs Assistance: Supervision/Verbal cueing Stair Management Technique: One rail Left;With cane Number of Stairs: 8 Height of Stairs: 6 Ramp: Supervision/Verbal cueing Curb: Supervision/Verbal cueing Wheelchair Mobility Wheelchair Mobility: No  Trunk/Postural Assessment  Cervical Assessment Cervical Assessment: Exceptions to [facility] (forward head) Thoracic Assessment Thoracic Assessment: Exceptions to [facility] (thoracic kyphosis) Lumbar Assessment Lumbar Assessment: Exceptions to [facility] (posterior pelvic tilt) Postural Control Postural Control: Within Functional Limits  Balance Balance Balance Assessed: Yes Standardized Balance Assessment Standardized Balance Assessment: Timed Up and Go Test Timed Up and Go Test TUG: Normal TUG Normal TUG (seconds): 21 (average over 5 trials with RW) Static Sitting Balance Static Sitting - Balance Support: No upper extremity supported;Feet supported Static Sitting -  Level of Assistance: 7: Independent Dynamic Sitting Balance Dynamic Sitting - Balance Support: Feet supported;During functional activity;No upper extremity supported Dynamic Sitting - Level of Assistance: 7: Independent Dynamic Sitting - Balance Activities: Lateral lean/weight shifting;Forward lean/weight shifting;Reaching for objects Static Standing Balance Static Standing - Balance Support: No upper extremity supported Static Standing - Level of Assistance: 5: Stand by assistance Static Standing - Comment/# of Minutes: able to stand without UE support with S Dynamic  Standing Balance Dynamic Standing - Balance Support: Left upper extremity supported Dynamic Standing - Level of Assistance: 5: Stand by assistance Dynamic Standing - Balance Activities: Reaching across midline Dynamic Standing - Comments: dynamic standing during shower level bathing Extremity Assessment  RUE Assessment RUE Assessment: Within Functional Limits LUE Assessment LUE Assessment: Within Functional Limits RLE Assessment RLE Assessment: Exceptions to [facility] Active Range of Motion (AROM) Comments: AROM knee extension in supine 3; knee flexion 70 degrees seated in chair General Strength Comments: 4/5 hip flexion, 5/5 knee extension, ankle DF 5/5; ankle inversion/eversion 4/5 LLE Assessment LLE Assessment: Within Functional Limits    Justin Rose  Justin Rose, PT 10/21/2020, 12:57 PM

## 2020-10-21 NOTE — Progress Notes (Signed)
Patient ID: Justin Rose, male   DOB: 04/14/1934, 86 y.o.   MRN: [id number]   [facility] declined OP due to insurance  [place], [place]

## 2020-10-21 NOTE — Plan of Care (Signed)
  Problem: RH Balance Goal: LTG Patient will maintain dynamic standing balance (PT) Description: LTG:  Patient will maintain dynamic standing balance with assistance during mobility activities (PT) Outcome: Completed/Met   Problem: Sit to Stand Goal: LTG:  Patient will perform sit to stand with assistance level (PT) Description: LTG:  Patient will perform sit to stand with assistance level (PT) Outcome: Completed/Met   Problem: RH Bed Mobility Goal: LTG Patient will perform bed mobility with assist (PT) Description: LTG: Patient will perform bed mobility with assistance, with/without cues (PT). Outcome: Completed/Met   Problem: RH Bed to Chair Transfers Goal: LTG Patient will perform bed/chair transfers w/assist (PT) Description: LTG: Patient will perform bed to chair transfers with assistance (PT). Outcome: Completed/Met   Problem: RH Car Transfers Goal: LTG Patient will perform car transfers with assist (PT) Description: LTG: Patient will perform car transfers with assistance (PT). Outcome: Completed/Met   Problem: RH Furniture Transfers Goal: LTG Patient will perform furniture transfers w/assist (OT/PT) Description: LTG: Patient will perform furniture transfers  with assistance (OT/PT). Outcome: Completed/Met   Problem: RH Ambulation Goal: LTG Patient will ambulate in controlled environment (PT) Description: LTG: Patient will ambulate in a controlled environment, # of feet with assistance (PT). Outcome: Completed/Met   Problem: RH Ambulation Goal: LTG Patient will ambulate in home environment (PT) Description: LTG: Patient will ambulate in home environment, # of feet with assistance (PT). Outcome: Completed/Met   Problem: RH Stairs Goal: LTG Patient will ambulate up and down stairs w/assist (PT) Description: LTG: Patient will ambulate up and down # of stairs with assistance (PT) Outcome: Completed/Met

## 2020-10-21 NOTE — Progress Notes (Signed)
Physical Therapy Session Note  Patient Details  Name: Justin Rose MRN: [id number] Date of Birth: [birth year]  Today's Date: 10/21/2020 PT Individual Time: 4496-7591 PT Individual Time Calculation (min): 60 min   Short Term Goals: Week 2:  PT Short Term Goal 1 (Week 2): LTG=STG due to ELOS  Skilled Therapeutic Interventions/Progress Updates:    Pt found sitting in recliner with leg rest up and icing RLE upon entry. Per pt report, had been icing for 25 minutes prior to therapy. Pt then performed sit to stand transfer to RW with S. Pt then amb 200' to elevators and then amb 100' to outside with a break to put on his t shirt with S. Stood to put on t shirt with no UE support for ~1 minute to don t shirt with S. Pt then amb outside up curbs and on uneven terrain ~600' with S. During outside walk pt encountered 4 steps which he ascending with MinA due to not having a cane and stepping up with his RLE first to "stretch it" per pt report. Pt then amb 100' to PT gym with S. Pt then performed the TUG x5 with S with an average time of 21 seconds, showing that he is at an increased risk for falls. PT then did a strength and ROM assessment of RLE. Pt then amb ~10' x2 and 20' x1 with no AD with CGA x1 for safety. Pt then amb 100' back to room with S and performed stand to sit transfer with S. Pt was left in recliner with chair alarm on and call button in reach and was applying ice pack when PT left room.   Therapy Documentation Precautions:  Precautions Precautions: Fall Precaution Comments: Right KI Required Braces or Orthoses: Knee Immobilizer - Right Restrictions Weight Bearing Restrictions: No RLE Weight Bearing: Weight bearing as tolerated   Therapy/Group: Individual Therapy  Dulce Sellar, SPT 10/21/2020, 4:54 PM

## 2020-10-21 NOTE — Progress Notes (Signed)
Occupational Therapy Discharge Summary  Patient Details  Name: Justin Rose MRN: [id number] Date of Birth: 1934/03/02  Today's Date: 10/21/2020 OT Individual Time: 1017-5102 and 5852-7782 OT Individual Time Calculation (min): 55 min and 27 min   Patient has met 10 of 10 long term goals due to improved activity tolerance, improved balance, postural control, ability to compensate for deficits and improved awareness.  Patient to discharge at overall Supervision level.  Patient's care partner is independent to provide the necessary physical assistance at discharge.  Pt is overall Supervision with self care including shower level bathing in standing, LB dressing, and ambulates to/from bathroom with RW in the same manner. Wife [name] has been present for most sessions and completing family ed on 12/6. Pt is supervision for standing ADLs for safety and recommended he have a shower seat as a back up and he is aware of where to purchase if he changes his mind.   Reasons goals not met: NA  Recommendation:  Patient will benefit from ongoing skilled OT services in outpatient setting to continue to advance functional skills in the area of BADL and Reduce care partner burden.  Equipment: BSC  Reasons for discharge: treatment goals met and discharge from hospital  Patient/family agrees with progress made and goals achieved: Yes   Skilled Intervention: Session 1: Pt greeted at time of session with wife [name] present for family education and training, wanting to take a shower. Ambulated to/from bathroom with supervision with RW, transferred into shower in same manner, declining recommended posterior method and insisting on walking straight in. Doffed clothing in standing with supervision for standing balance and performed UB/LB bathing in standing with no seated rest breaks with use of grab bars for safety and unilateral support throughout. Dried off in same manner before walking short distance to bench and  getting dressed from seated level, set up UB and Supervision LB. Offered for wife to stand in bathroom to supervise shower and dressing but declined in favor of standing at doorway to observe. Donned TEDS max/total and shoes with set up. Walked to sink level and performed oral hygien in standing with supervision for standing only, once finished set up in chair with call bell in reach and all needs met.   Session 2: Pt greeted at time of session sitting up in recliner with wife present, no c/o pain throughout session. Wife leaving at beginning of session and left phone in room, therapist meeting her at main entrance at beginning of session per pt request to return phone to wife. Remainder of session focused on BUE HEP strengthening program with primarily theraband, cues for form and pacing with all exercises reviewed for 1x10 to promote carryover. Pt with no questions, hand out provided. Pt feeling good about going home tomororw. Alarm on, call bell in reach.   OT Discharge Precautions/Restrictions  Precautions Precautions: Fall Restrictions Weight Bearing Restrictions: No Pain Pain Assessment Pain Scale: 0-10 Pain Score: 2  Pain Type: Acute pain Pain Location: Knee Pain Orientation: Right Pain Descriptors / Indicators: Sore Pain Onset: With Activity Pain Intervention(s): Cutaneous stimulation (taping) ADL ADL Eating: Independent Grooming: Setup Upper Body Bathing: Supervision/safety (in standing) Where Assessed-Upper Body Bathing: Sitting at sink Lower Body Bathing: Supervision/safety (in standing) Where Assessed-Lower Body Bathing: Standing at sink Upper Body Dressing: Setup Where Assessed-Upper Body Dressing: Sitting at sink Lower Body Dressing: Supervision/safety Toileting: Supervision/safety Toilet Transfer: Close supervision Toilet Transfer Method: Magazine features editor: Close supervision Vision Baseline Vision/History: Wears glasses Wears Glasses: At all  times Patient Visual Report: No change from baseline Perception  Perception: Within Functional Limits Praxis Praxis: Intact Cognition Overall Cognitive Status: Within Functional Limits for tasks assessed Arousal/Alertness: Awake/alert Orientation Level: Oriented X4 Attention: Selective Selective Attention: Appears intact Memory: Appears intact Awareness: Appears intact Problem Solving: Impaired Safety/Judgment: Appears intact Sensation Sensation Light Touch: Appears Intact Coordination Gross Motor Movements are Fluid and Coordinated: Yes Fine Motor Movements are Fluid and Coordinated: Yes Motor  Motor Motor: Within Functional Limits Motor - Discharge Observations: history of postural deficits due to lower back pain and kyphotic posture Mobility  Transfers Sit to Stand: Supervision/Verbal cueing  Trunk/Postural Assessment  Cervical Assessment Cervical Assessment: Within Functional Limits Thoracic Assessment Thoracic Assessment: Exceptions to [facility] Lumbar Assessment Lumbar Assessment: Exceptions to [facility] Postural Control Postural Control: Within Functional Limits  Balance Balance Balance Assessed: Yes Static Sitting Balance Static Sitting - Balance Support: No upper extremity supported;Feet supported Static Sitting - Level of Assistance: 7: Independent Dynamic Sitting Balance Dynamic Sitting - Balance Support: Feet supported;During functional activity;No upper extremity supported Dynamic Sitting - Level of Assistance: 7: Independent Dynamic Sitting - Balance Activities: Lateral lean/weight shifting;Forward lean/weight shifting;Reaching for objects Static Standing Balance Static Standing - Balance Support: Bilateral upper extremity supported Static Standing - Level of Assistance: 5: Stand by assistance Dynamic Standing Balance Dynamic Standing - Balance Support: During functional activity;Left upper extremity supported Dynamic Standing - Level of Assistance: 5: Stand by  assistance Dynamic Standing - Balance Activities: Lateral lean/weight shifting;Forward lean/weight shifting Dynamic Standing - Comments: dynamic standing during shower level bathing Extremity/Trunk Assessment RUE Assessment RUE Assessment: Within Functional Limits LUE Assessment LUE Assessment: Within Functional Limits   [name] 10/21/2020, 12:48 PM

## 2020-10-22 DIAGNOSIS — M1711 Unilateral primary osteoarthritis, right knee: Secondary | ICD-10-CM | POA: Diagnosis not present

## 2020-10-22 MED ORDER — POLYSACCHARIDE IRON COMPLEX 150 MG PO CAPS: 150.0000 mg | ORAL_CAPSULE | Freq: Every day | ORAL | 0 refills | Status: AC

## 2020-10-22 MED ORDER — SENNOSIDES-DOCUSATE SODIUM 8.6-50 MG PO TABS: 2.0000 | ORAL_TABLET | Freq: Every day | ORAL | 0 refills | Status: AC

## 2020-10-22 MED ORDER — RIVAROXABAN 15 MG PO TABS: 15.0000 mg | ORAL_TABLET | Freq: Two times a day (BID) | ORAL | 0 refills | Status: AC

## 2020-10-22 MED ORDER — RIVAROXABAN 20 MG PO TABS: 20.0000 mg | ORAL_TABLET | Freq: Every day | ORAL | 0 refills | Status: AC

## 2020-10-22 MED ORDER — TRAMADOL HCL 50 MG PO TABS
50.0000 mg | ORAL_TABLET | Freq: Two times a day (BID) | ORAL | 0 refills | Status: DC | PRN
Start: 2020-10-22 — End: 2020-10-30

## 2020-10-22 MED ORDER — TAMSULOSIN HCL 0.4 MG PO CAPS: 0.4000 mg | ORAL_CAPSULE | Freq: Every day | ORAL | 0 refills | Status: AC

## 2020-10-22 MED ORDER — METHOCARBAMOL 500 MG PO TABS: 500.0000 mg | ORAL_TABLET | Freq: Four times a day (QID) | ORAL | 0 refills | Status: AC | PRN

## 2020-10-22 MED ORDER — PANTOPRAZOLE SODIUM 40 MG PO TBEC: 40.0000 mg | DELAYED_RELEASE_TABLET | Freq: Every day | ORAL | 0 refills | Status: AC

## 2020-10-22 NOTE — Progress Notes (Signed)
[facility] PHYSICAL MEDICINE & REHABILITATION PROGRESS NOTE   Subjective/Complaints:  Pt going home today- was able to get to 65-75 degrees yesterday with R knee- walked A LOT and walked 75 ft with no RW.     ROS:   Pt denies SOB, abd pain, CP, N/V/C/D, and vision changes   Objective:   No results found. Recent Labs    10/21/20 0537  WBC 6.3  HGB 10.6*  HCT 33.2*  PLT 373   No results for input(s): NA, K, CL, CO2, GLUCOSE, BUN, CREATININE, CALCIUM in the last 72 hours.  Intake/Output Summary (Last 24 hours) at 10/22/2020 0847 Last data filed at 10/22/2020 0745 Gross per 24 hour  Intake 840 ml  Output 1650 ml  Net -810 ml        Physical Exam: Vital Signs Blood pressure 128/87, pulse 81, temperature 98.4 F (36.9 C), temperature source Oral, resp. rate 17, height 5\' 11"  (1.803 m), weight 81.3 kg, SpO2 98 %. Constitutional: awake, alert, sitting up in w/c, appropriate, NAD HEENT: EOMI, oral membranes moist Neck: supple Cardiovascular: RRR    Respiratory/Chest: CTA B/L- no W/R/R- good air movement  GI/Abdomen: Soft, NT, ND, (+)BS  Ext: no clubbing, cyanosis, or edema Psych: pleasant and cooperative Skin: knee wound CDI. 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 Normal insight and awareness. Intact Memory. Normal language and speech. Cranial nerve exam unremarkable  Motor: B/l UE: 5/5 proximal to distal RLE: HF 4-/5, KE 3/5 ADF 4/5 (some pain inhibition), unchanged LLE: HF, KE 4/5, ADF 4/5   Assessment/Plan: 1. Functional deficits which require 3+ hours per day of interdisciplinary therapy in a comprehensive inpatient rehab setting.  Physiatrist is providing close team supervision and 24 hour management of active medical problems listed  below.  Physiatrist and rehab team continue to assess barriers to discharge/monitor patient progress toward functional and medical goals  Care Tool:  Bathing    Body parts bathed by patient: Right arm, Left arm, Chest, Abdomen, Front perineal area, Buttocks, Right upper leg, Left upper leg, Face, Left lower leg, Right lower leg   Body parts bathed by helper: Left lower leg, Buttocks Body parts n/a: Right lower leg (in KI in standing, did not wash)   Bathing assist Assist Level: Supervision/Verbal cueing (standing with grab bar)     Upper Body Dressing/Undressing Upper body dressing   What is the patient wearing?: Pull over shirt    Upper body assist Assist Level: Supervision/Verbal cueing (standing)    Lower Body Dressing/Undressing Lower body dressing      What is the patient wearing?: Pants     Lower body assist Assist for lower body dressing: Supervision/Verbal cueing     Toileting Toileting    Toileting assist Assist for toileting: Supervision/Verbal cueing     Transfers Chair/bed transfer  Transfers assist     Chair/bed transfer assist level: Supervision/Verbal cueing     Locomotion Ambulation   Ambulation assist      Assist level: Supervision/Verbal cueing Assistive device: Walker-rolling Max distance: 150   Walk 10 feet activity   Assist     Assist level: Supervision/Verbal cueing Assistive device: Walker-rolling   Walk 50 feet activity   Assist    Assist level: Supervision/Verbal cueing Assistive device: Walker-rolling    Walk 150 feet  activity   Assist Walk 150 feet activity did not occur: Safety/medical concerns  Assist level: Supervision/Verbal cueing Assistive device: Walker-rolling    Walk 10 feet on uneven surface  activity   Assist Walk 10 feet on uneven surfaces activity did not occur: Safety/medical concerns   Assist level: Supervision/Verbal cueing Assistive device: Horticulturist, commercial Will patient use wheelchair at discharge?: No      Wheelchair assist level: Minimal Assistance - Patient > 75% Max wheelchair distance: 130    Wheelchair 50 feet with 2 turns activity    Assist        Assist Level: Minimal Assistance - Patient > 75%   Wheelchair 150 feet activity     Assist      Assist Level: Minimal Assistance - Patient > 75%   Blood pressure 128/87, pulse 81, temperature 98.4 F (36.9 C), temperature source Oral, resp. rate 17, height 5\' 11"  (1.803 m), weight 81.3 kg, SpO2 98 %.  Medical Problem List and Plan: 1.  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  Continue CIR  DC CPM-discussed with patient and nursing.  12/1- daily dressing changes written for- took off surgical dressing  12/4-5- reviewed knee ROM exercises with patient   -ongoing swelling/bruising which are making it difficult  12/6- d/c tomorrow- will need f/u with Dr Dagoberto Ligas- can do Transitional care with Zella Ball, NP  12/7- 70 degrees ROM R knee- will schedule 1 f/u in clinic.  2.  Antithrombotics: -DVT/anticoagulation: Xarelto ordered.   Venous Dopplers showing right saphenous vein junction, posterior tib, peroneal vein DVT             -antiplatelet therapy: ASA/Plavix DC'd, discussed with neurology, given DOAC. 3. Pain Management: Oxycodone, tramadol and/or robaxin prn.   Controlled with meds on 11/28  11/30- pain from arthritis- cannot start meloxicam, unfortunately due to being on Xarelto- explained to pt- will need to con't tramadol prn  12/6- pain controlled with tramadol prn- con't  12/7- explained to pt no meloxicam while on Xarelto-   Monitor with increased exertion 4. Mood: LCSW to follow for evaluation and support.              -antipsychotic agents: N/A 5. Neuropsych: This patient is capable of making decisions on his own behalf. 6. Skin/Wound Care: Monitor  incision for healing.  Added protein supplement to help promote wound healing.  7. Fluids/Electrolytes/Nutrition: Monitor I/Os.  8. Dizziness: Monitor orthostatic vitals. Encourage fluid intake.  Orthostatics borderline positive on 11/28  Compression stockings/abdominal binder as necessary  12/3- doing well- no Sx's- con't regimen 9. ABLA:              Stool guaiacs pending             Iron supplement added  Hemoglobin 9.8 on 11/25, labs ordered for tomorrow  11/29- Hb stable- 9.3 Hemoccult (+ + - )   Recheck hgb Monday 10. Statin induced myositis:   LFTs elevated on 11/25, labs ordered for tomorrow  11/29- ALT and AST much better- AST normal and ALT almost normal-con't to monitor 11. Drug induced constipation:   Bowel meds resumed.  Will consider further increase tomorrow if no bowel movement today.  11/30- will give dose of Sorbitol at 3pm- also add another senokot prn  12/1- no BM- will give milk of Magnesium 40ml  12/2- will decrease iron to daily and increase senokot to 2 tabs daily-  12/5 moved bowels 12/3--feels that he needs to empty today 12. Proteus UTI:  IV Rocephin X 3 days-->Keflex course completed on 11/27.  Repeat UA normal  Reviewed urine culture with multiple species 13. Urinary retention: Continue Flomax.    Overall improving 14.  Hyponatremia  Sodium 134 on 11/25, labs ordered for tomorrow  11/30- Na up to 137 15.  Leukocytosis  WBCs 12.4 on 11/25, labs ordered for tomorrow  11/29- WBC 8.6- much improved  See #12 16.?  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  Lysine at home daily.  Encouraged patient to ask his wife to bring for evaluation  11/30- can take lysine daily- have wife bring in 55.  Sleep disturbance  Benadryl 25 ordered per patient request as this is his medication PTA.  12/2- sleeping much better- con't regimen  LOS: 13 days A FACE TO FACE EVALUATION WAS PERFORMED  Giacomo Valone 10/22/2020, 8:47 AM

## 2020-10-22 NOTE — Progress Notes (Signed)
Inpatient Rehabilitation Care Coordinator  Discharge Note  The overall goal for the admission was met for:   Discharge location: Yes, home   Length of Stay: Yes, 13 Days  Discharge activity level: Yes, ambulatory level Supervision  Home/community participation: Yes  Services provided included: MD, RD, PT, OT, SLP, RN, CM, TR, Pharmacy, [place]: Private Insurance: [employer] Medicare  Follow-up services arranged: [facility] (PT)  Comments (or additional information): Bedside [facility]  Patient/Family verbalized understanding of follow-up arrangements: Yes  Individual responsible for coordination of the follow-up plan: self, [phone]  Confirmed correct DME delivered: Dyanne Iha 10/22/2020    Dyanne Iha

## 2020-10-22 NOTE — Progress Notes (Signed)
Patient and spouse present for discharge instructions.  Instructions given by Reesa Chew PA.  All questions answered.  Patient or family had no questions at this time.

## 2020-10-22 NOTE — Progress Notes (Signed)
Patient ID: Justin Rose, male   DOB: 1934/03/01, 86 y.o.   MRN: [id number]   Sw informed patient just wants to focus on PT recommendations (Per PA). Due to patient not wanting OT, patient referral set up with [facility] for just PT.   [place], [place]

## 2020-10-23 NOTE — Progress Notes (Signed)
[place] PDMP was reviewed for past 12 months prior to prescribing Ultram 50 mg # 26 pills --1-2 tabs every 6 hours prn severe pain.

## 2020-10-30 ENCOUNTER — Other Ambulatory Visit: Payer: Self-pay | Admitting: Physical Medicine and Rehabilitation

## 2020-12-02 ENCOUNTER — Inpatient Hospital Stay: Payer: Medicare HMO | Admitting: Physical Medicine and Rehabilitation

## 2021-04-01 ENCOUNTER — Other Ambulatory Visit: Payer: Self-pay | Admitting: Physical Medicine and Rehabilitation

## 2021-04-01 DIAGNOSIS — M5126 Other intervertebral disc displacement, lumbar region: Secondary | ICD-10-CM

## 2021-04-03 ENCOUNTER — Telehealth: Payer: Self-pay

## 2021-04-03 NOTE — Progress Notes (Signed)
Phone call to patient to verify medication list and allergies for myelogram procedure. Medications pt is currently taking are safe to continue to take. Advised pt if any new medications are started prior to procedure to call and make [place] aware. Pt also instructed to have a driver the day of the procedure and discharge instructions. Pt verbalized understanding.

## 2021-04-11 ENCOUNTER — Other Ambulatory Visit: Payer: Self-pay

## 2021-04-11 ENCOUNTER — Ambulatory Visit
Admission: RE | Admit: 2021-04-11 | Discharge: 2021-04-11 | Disposition: A | Discharge status: Home / Self Care | Payer: Medicare HMO | Source: Ambulatory Visit | Attending: Physical Medicine and Rehabilitation | Admitting: Physical Medicine and Rehabilitation

## 2021-04-11 DIAGNOSIS — M5126 Other intervertebral disc displacement, lumbar region: Secondary | ICD-10-CM

## 2021-04-11 MED ORDER — DIAZEPAM 5 MG PO TABS
5.0000 mg | ORAL_TABLET | Freq: Once | ORAL | Status: AC
  Administered 2021-04-11: 5 mg via ORAL

## 2021-04-11 NOTE — Discharge Instructions (Signed)
Myelogram Discharge Instructions  1. Go home and rest quietly as needed. You may resume normal activities; however, do not exert yourself strongly or do any heavy lifting today and tomorrow.   2. DO NOT drive today.    3. You may resume your normal diet and medications unless otherwise indicated. Drink lots of extra fluids today and tomorrow.   4. The incidence of headache, nausea, or vomiting is about 5% (one in 20 patients).  If you develop a headache, lie flat for 24 hours and drink plenty of fluids until the headache goes away.  Caffeinated beverages may be helpful. If when you get up you still have a headache when standing, go back to bed and force fluids for another 24 hours.   5. If you develop severe nausea and vomiting or a headache that does not go away with the flat bedrest after 48 hours, please call [phone].   6. Call your physician for a follow-up appointment.  The results of your myelogram will be sent directly to your physician by the following day.  7. If you have any questions or if complications develop after you arrive home, please call [phone].  Discharge instructions have been explained to the patient.  The patient, or the person responsible for the patient, fully understands these instructions.   Thank you for visiting our office today.

## 2022-01-08 IMAGING — CT CT ANGIO NECK
2 of 7 series · 8 of 33 positions shown · IV contrast (APPLIED)
Comparison: CT head and MRI head 10/01/2020

CLINICAL DATA: Acute neuro deficit.  Stroke.

EXAM:
CT ANGIOGRAPHY HEAD AND NECK
TECHNIQUE: Multidetector CT imaging of the head and neck was performed using
the standard protocol during bolus administration of intravenous
contrast. Multiplanar CT image reconstructions and MIPs were
obtained to evaluate the vascular anatomy. Carotid stenosis
measurements (when applicable) are obtained utilizing NASCET
criteria, using the distal internal carotid diameter as the
denominator.
CONTRAST:  100mL OMNIPAQUE IOHEXOL 350 MG/ML SOLN

[Series 5: cta head neck · axial · 0.55mm/px · z∈[+1330,+1450]mm · 2 of 177 slices shown]
[im 59/177  soft-tissue]
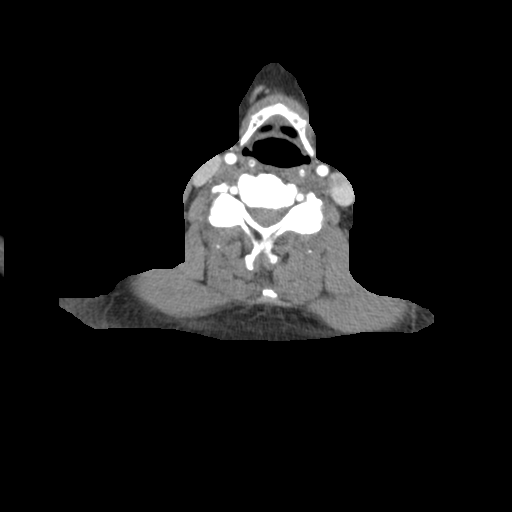
[im 118/177  soft-tissue]
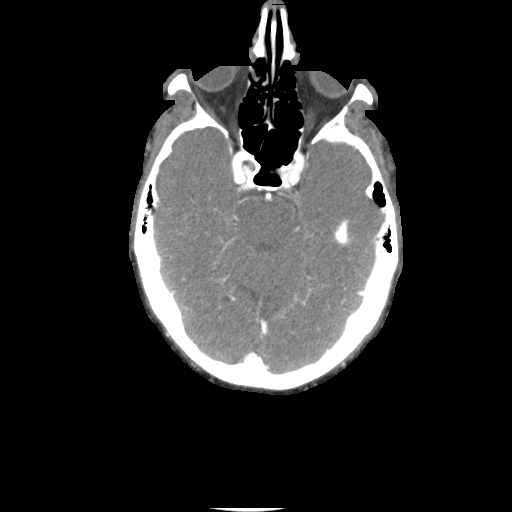

[Series 7: ax thin · axial · 0.37mm/px · z∈[+1250,+1514]mm · 6 of 370 slices shown]
[im 53/370  soft-tissue]
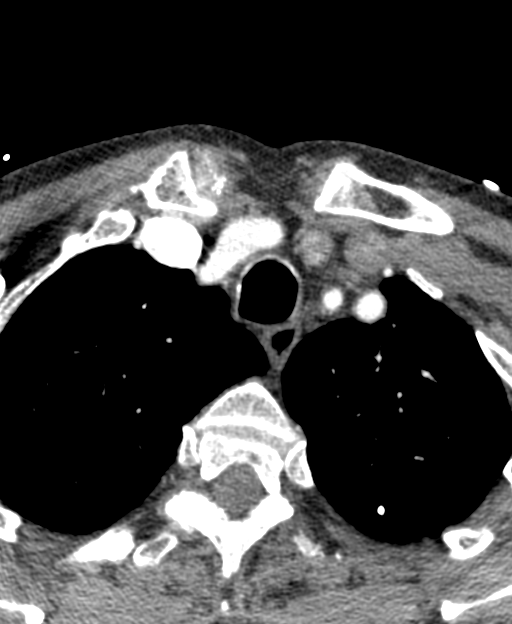
[im 106/370  bone]
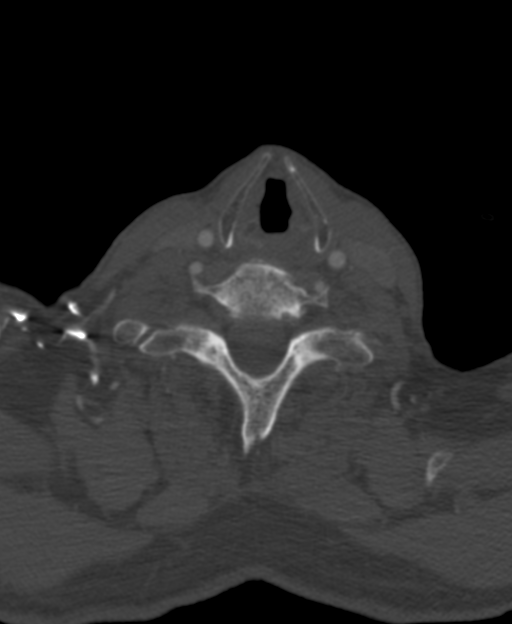
[im 159/370  soft-tissue]
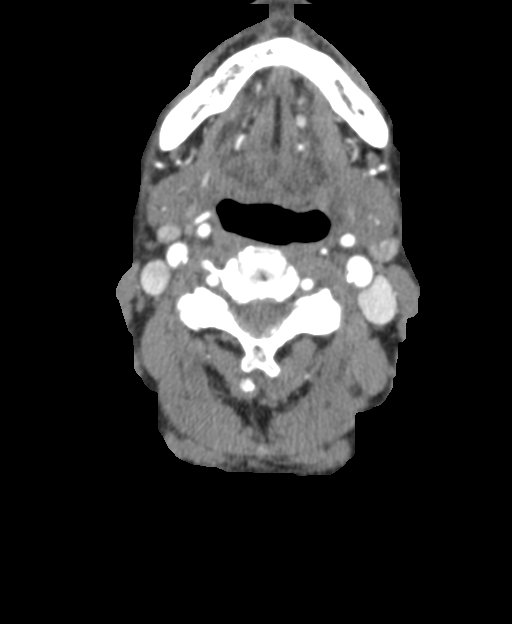
[im 211/370  bone]
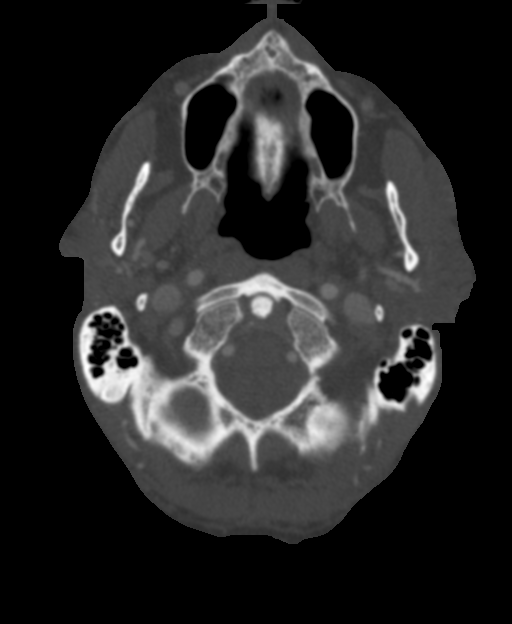
[im 264/370  soft-tissue]
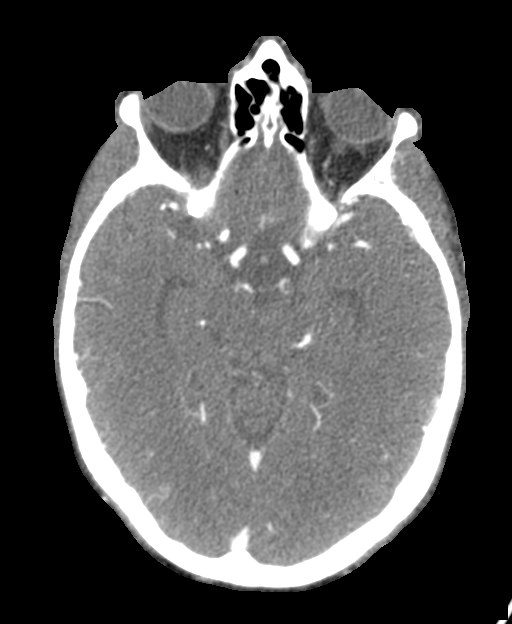
[im 317/370  bone]
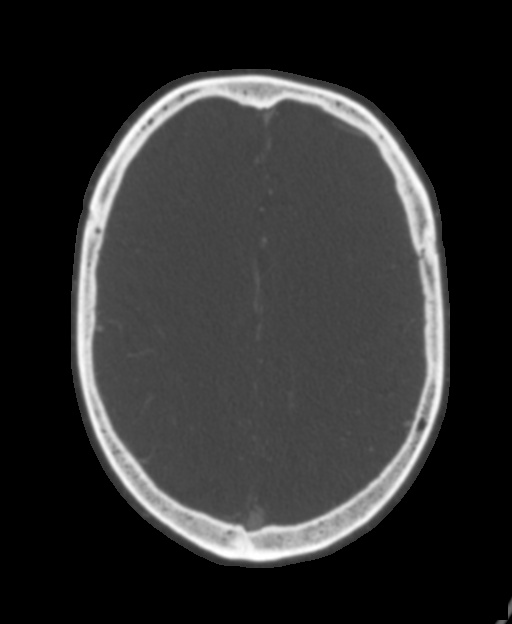

[8 of 33 positions shown; findings below may reference images not displayed]

FINDINGS: CTA NECK FINDINGS

Aortic arch: Standard branching. Imaged portion shows no evidence of
aneurysm or dissection. No significant stenosis of the major arch
vessel origins.

Right carotid system: Atherosclerotic calcification right carotid
bulb without significant stenosis.

Left carotid system: Atherosclerotic calcification left carotid bulb
without significant stenosis.

Vertebral arteries: Both vertebral arteries patent to the basilar
without stenosis.

Skeleton: Cervical spondylosis.  No acute skeletal abnormality.

Other neck: 10 mm left thyroid nodule. No further imaging necessary.
No enlarged lymph nodes in the neck.

Upper chest: Lung apices clear bilaterally.

Review of the MIP images confirms the above findings

CTA HEAD FINDINGS

Anterior circulation: Mild atherosclerotic calcification in the
cavernous carotid bilaterally without stenosis. Anterior and middle
cerebral arteries patent bilaterally without significant stenosis or
large vessel occlusion.

Posterior circulation: Both vertebral arteries patent to the
basilar. Left PICA patent. Right PICA not visualized. Basilar widely
patent. AICA, superior cerebellar, and posterior cerebral arteries
patent bilaterally without stenosis or large vessel occlusion. The

Venous sinuses: Normal venous enhancement

Anatomic variants: None

Review of the MIP images confirms the above findings
IMPRESSION: 1. No significant carotid or vertebral artery stenosis in the neck
2. Negative for intracranial large vessel occlusion or flow limiting
stenosis
3. Findings support cerebral emboli based on diffusion-weighted
imaging.

## 2022-01-12 IMAGING — US US RENAL
1 series · 14 of 25 positions shown · non-contrast
Comparison: None.

CLINICAL DATA: 86-year-old male with urinary retention.

EXAM:
RENAL / URINARY TRACT ULTRASOUND COMPLETE

[Series 1: us renal · 14 of 38 slices shown]
[im 1/38]
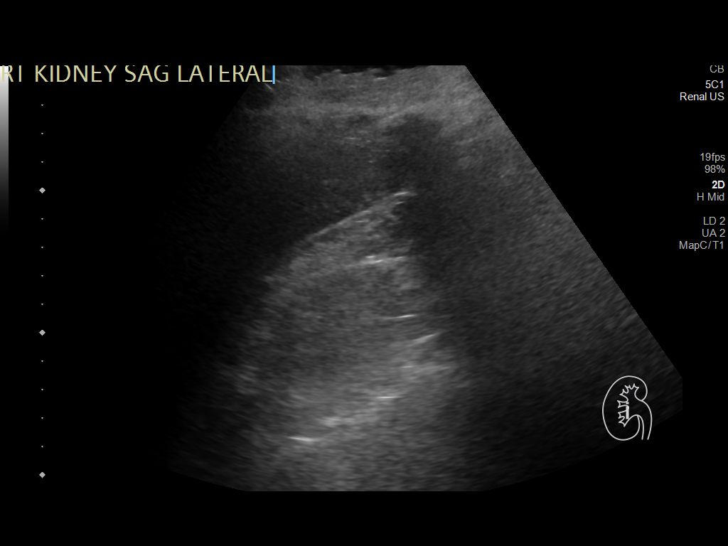
[im 4/38]
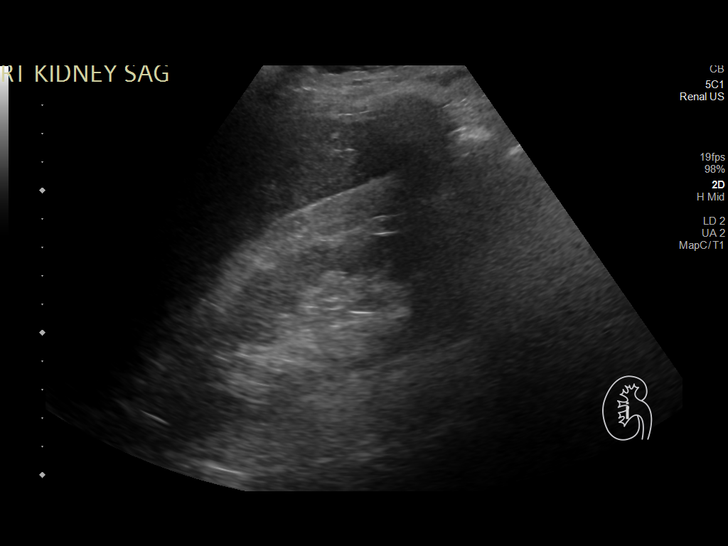
[im 7/38]
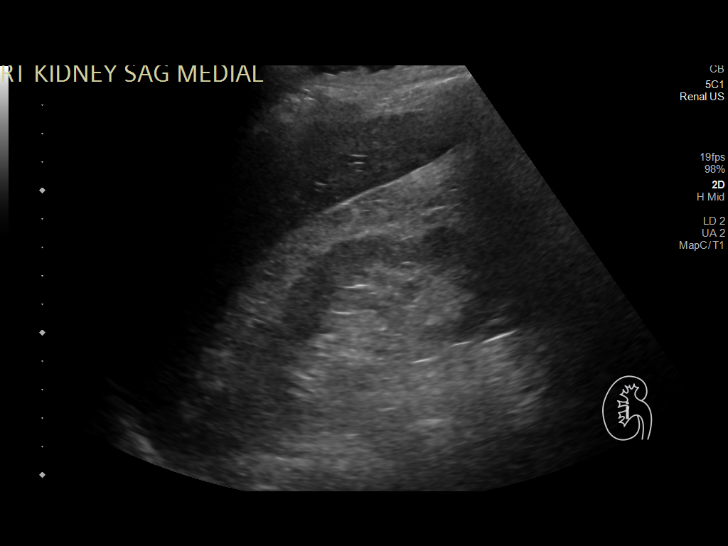
[im 10/38]
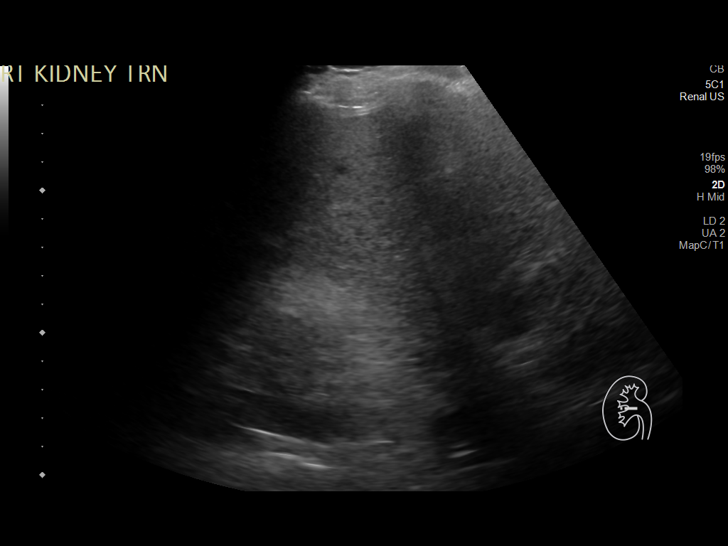
[im 13/38]
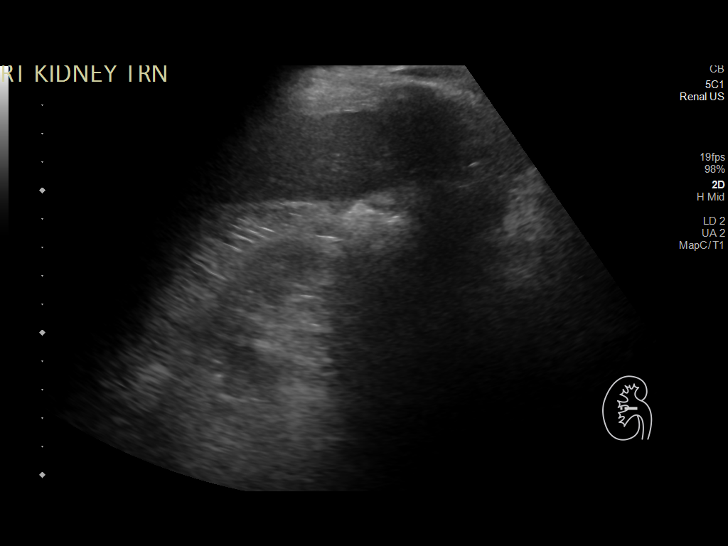
[im 14/38]
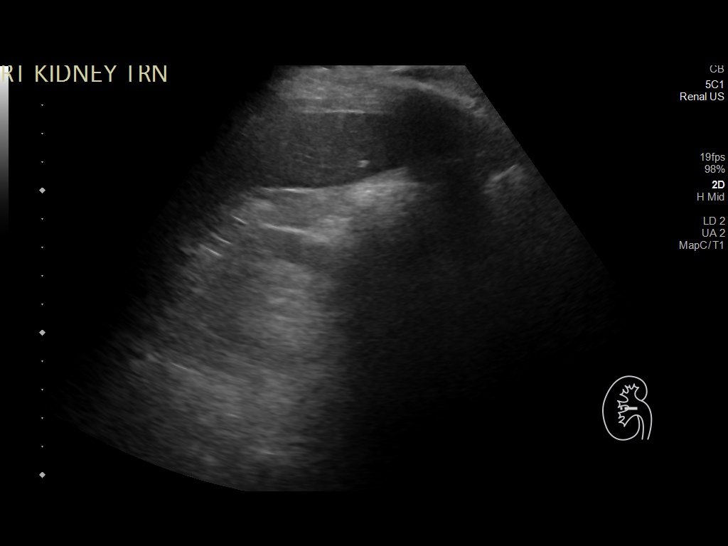
[im 17/38]
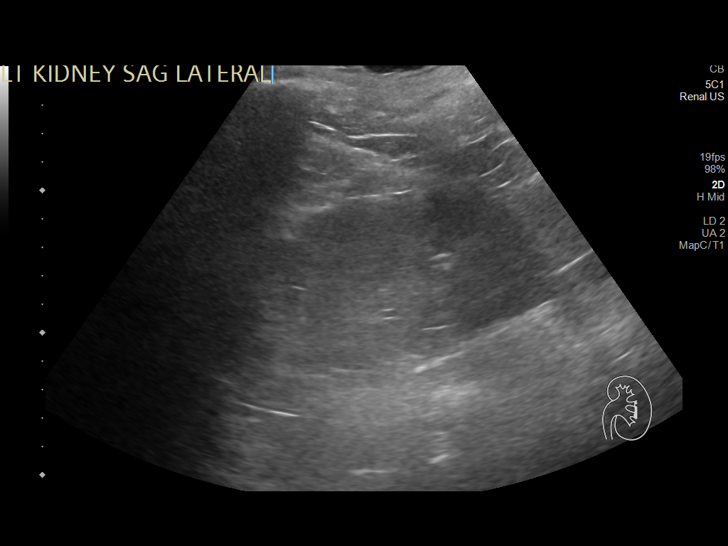
[im 21/38]
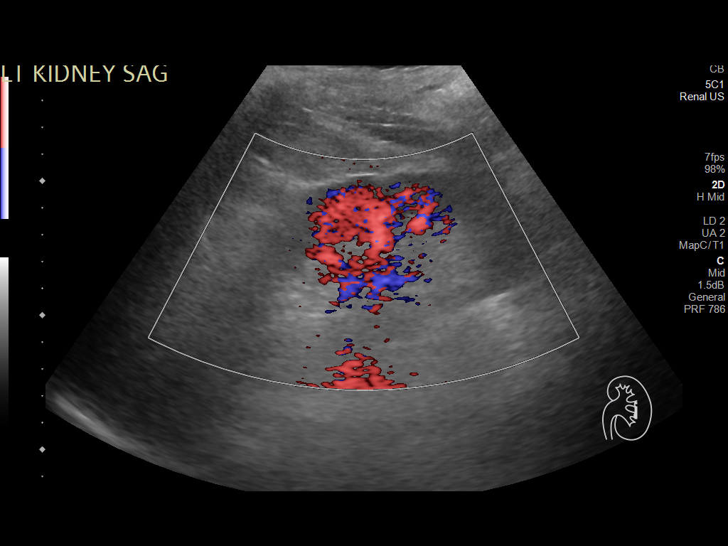
[im 24/38]
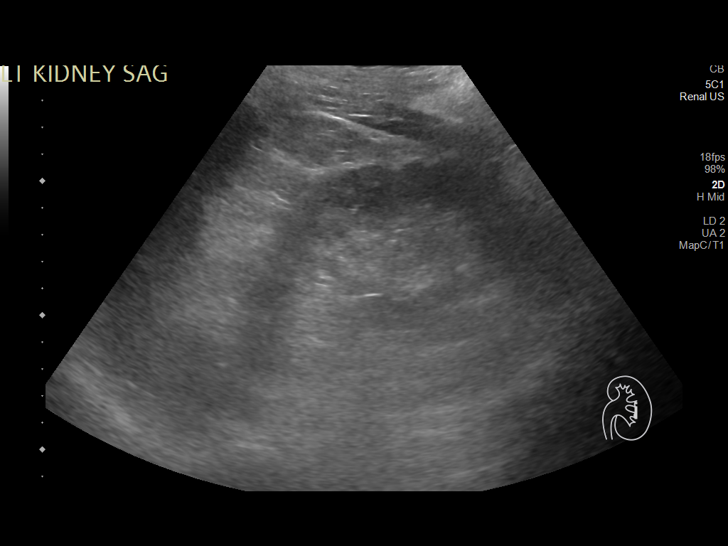
[im 25/38]
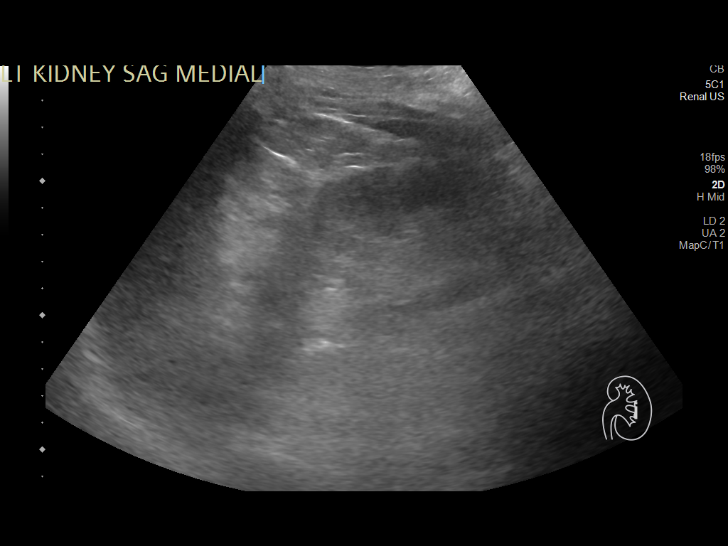
[im 28/38]
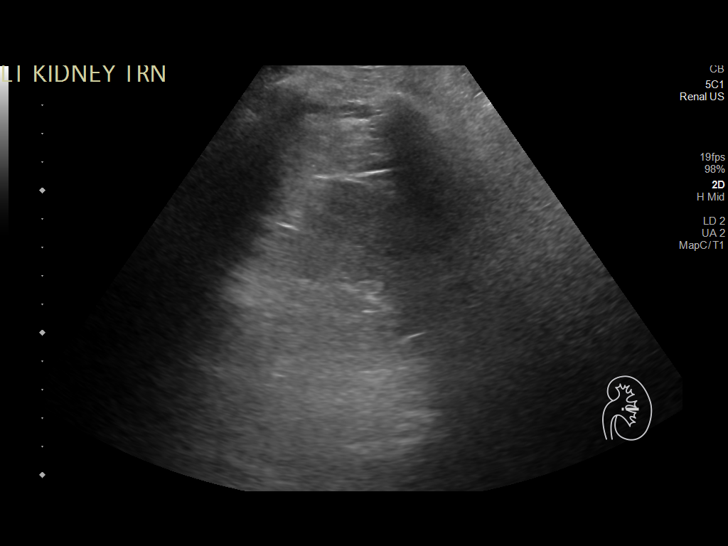
[im 31/38]
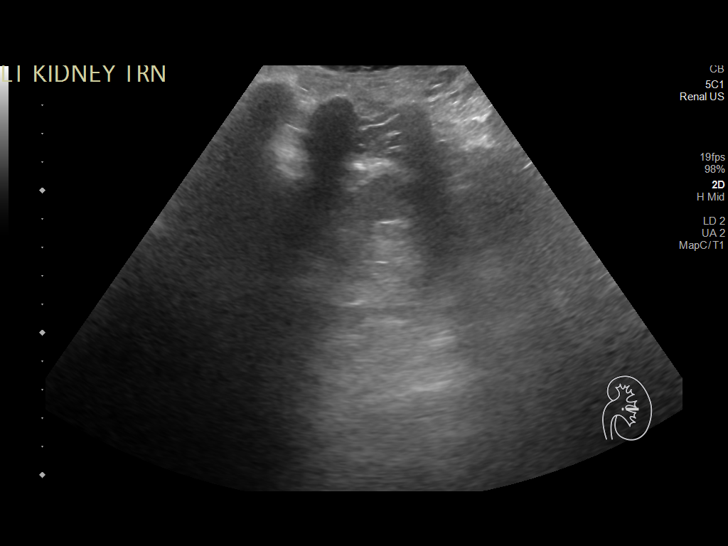
[im 34/38]
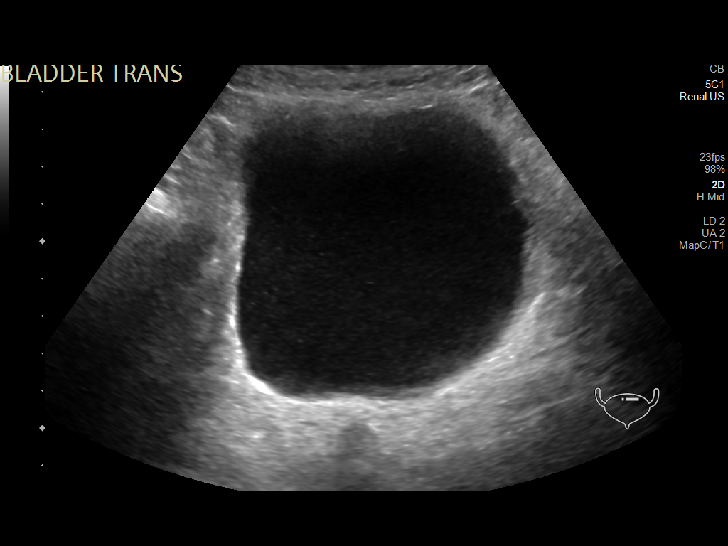
[im 38/38]
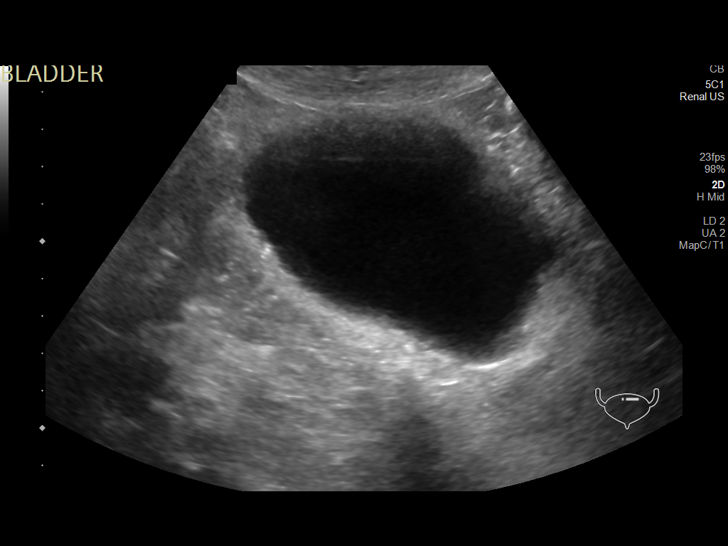

[14 of 25 positions shown; findings below may reference images not displayed]

FINDINGS: Right Kidney:

Renal measurements: 11.5 x 4.9 x 4.5 cm = volume: 130 mL. Cortical
thinning is present. Echogenicity within normal limits. No mass or
hydronephrosis visualized.

Left Kidney:

Renal measurements: 11.6 x 5.5 x 4.8 cm = volume: 161 mL. Cortical
thinning is noted. Echogenicity within normal limits. No mass or
hydronephrosis visualized.

Bladder:

A small amount of debris is noted within the bladder. No other
bladder abnormalities are noted.

Other:

None.
IMPRESSION: 1. Small amount of debris within the bladder. Correlate with
possible infection.
2. Bilateral renal cortical thinning.
3. No evidence of hydronephrosis.
